# Patient Record
Sex: Female | Born: 1960
Health system: Southern US, Community
[De-identification: ages and names within clinical notes are randomized; demographics above are authoritative.]

## PROBLEM LIST (undated history)

## (undated) DIAGNOSIS — E7800 Pure hypercholesterolemia, unspecified: Secondary | ICD-10-CM

## (undated) DIAGNOSIS — I10 Essential (primary) hypertension: Secondary | ICD-10-CM

## (undated) DIAGNOSIS — F32A Depression, unspecified: Secondary | ICD-10-CM

## (undated) DIAGNOSIS — M545 Low back pain: Secondary | ICD-10-CM

## (undated) DIAGNOSIS — R519 Headache, unspecified: Secondary | ICD-10-CM

## (undated) DIAGNOSIS — R51 Headache: Secondary | ICD-10-CM

## (undated) DIAGNOSIS — F329 Major depressive disorder, single episode, unspecified: Secondary | ICD-10-CM

## (undated) DIAGNOSIS — D649 Anemia, unspecified: Secondary | ICD-10-CM

## (undated) DIAGNOSIS — M1990 Unspecified osteoarthritis, unspecified site: Secondary | ICD-10-CM

## (undated) DIAGNOSIS — E119 Type 2 diabetes mellitus without complications: Secondary | ICD-10-CM

## (undated) DIAGNOSIS — F4312 Post-traumatic stress disorder, chronic: Secondary | ICD-10-CM

## (undated) DIAGNOSIS — R0602 Shortness of breath: Secondary | ICD-10-CM

## (undated) DIAGNOSIS — R011 Cardiac murmur, unspecified: Secondary | ICD-10-CM

## (undated) DIAGNOSIS — E1365 Other specified diabetes mellitus with hyperglycemia: Secondary | ICD-10-CM

## (undated) DIAGNOSIS — R569 Unspecified convulsions: Secondary | ICD-10-CM

## (undated) HISTORY — DX: Type 2 diabetes mellitus without complications: E11.9

## (undated) HISTORY — DX: Low back pain: M54.5

## (undated) HISTORY — DX: Other specified diabetes mellitus with hyperglycemia: E13.65

## (undated) HISTORY — DX: Post-traumatic stress disorder, chronic: F43.12

## (undated) HISTORY — DX: Shortness of breath: R06.02

## (undated) HISTORY — PX: BREAST SURGERY: SHX581

## (undated) HISTORY — DX: Essential (primary) hypertension: I10

## (undated) HISTORY — PX: FINE NEEDLE ASPIRATION: SHX406

## (undated) HISTORY — PX: CHOLECYSTECTOMY: SHX55

---

## 2009-06-08 ENCOUNTER — Emergency Department (HOSPITAL_COMMUNITY): Admission: EM | Admit: 2009-06-08 | Discharge: 2009-06-08 | Payer: Self-pay | Admitting: Emergency Medicine

## 2014-07-19 ENCOUNTER — Emergency Department: Payer: Self-pay | Admitting: Emergency Medicine

## 2014-08-06 HISTORY — PX: BREAST EXCISIONAL BIOPSY: SUR124

## 2014-11-09 ENCOUNTER — Emergency Department
Admit: 2014-11-09 | Disposition: A | Discharge status: Home / Self Care | Payer: Self-pay | Admitting: Emergency Medicine

## 2014-11-09 LAB — COMPREHENSIVE METABOLIC PANEL
ALBUMIN: 4.1 g/dL
ALT: 16 U/L
AST: 12 U/L — AB
Alkaline Phosphatase: 67 U/L
Anion Gap: 7 (ref 7–16)
BUN: 14 mg/dL
Bilirubin,Total: 0.2 mg/dL — ABNORMAL LOW
CALCIUM: 9.2 mg/dL
CO2: 25 mmol/L
CREATININE: 0.65 mg/dL
Chloride: 106 mmol/L
EGFR (Non-African Amer.): >60
Glucose: 113 mg/dL — ABNORMAL HIGH
POTASSIUM: 3.8 mmol/L
Sodium: 138 mmol/L
Total Protein: 8.1 g/dL

## 2014-11-09 LAB — CBC WITH DIFFERENTIAL/PLATELET
BASOS PCT: 0.4 %
Basophil #: 0 10*3/uL (ref 0.0–0.1)
EOS PCT: 2.5 %
Eosinophil #: 0.1 10*3/uL (ref 0.0–0.7)
HCT: 38.3 % (ref 35.0–47.0)
HGB: 12.3 g/dL (ref 12.0–16.0)
LYMPHS ABS: 1.4 10*3/uL (ref 1.0–3.6)
Lymphocyte %: 24.3 %
MCH: 27.9 pg (ref 26.0–34.0)
MCHC: 32.1 g/dL (ref 32.0–36.0)
MCV: 87 fL (ref 80–100)
MONOS PCT: 6.7 %
Monocyte #: 0.4 x10 3/mm (ref 0.2–0.9)
NEUTROS ABS: 3.9 10*3/uL (ref 1.4–6.5)
Neutrophil %: 66.1 %
Platelet: 261 10*3/uL (ref 150–440)
RBC: 4.4 10*6/uL (ref 3.80–5.20)
RDW: 17.9 % — ABNORMAL HIGH (ref 11.5–14.5)
WBC: 5.9 10*3/uL (ref 3.6–11.0)

## 2014-11-21 ENCOUNTER — Emergency Department
Admit: 2014-11-21 | Disposition: A | Discharge status: Home / Self Care | Payer: Self-pay | Admitting: Emergency Medicine

## 2014-11-25 DIAGNOSIS — Z029 Encounter for administrative examinations, unspecified: Secondary | ICD-10-CM

## 2014-12-23 ENCOUNTER — Other Ambulatory Visit: Payer: Self-pay | Admitting: Ophthalmology

## 2014-12-23 DIAGNOSIS — J4520 Mild intermittent asthma, uncomplicated: Secondary | ICD-10-CM

## 2014-12-23 DIAGNOSIS — R0602 Shortness of breath: Secondary | ICD-10-CM

## 2015-01-06 ENCOUNTER — Ambulatory Visit: Payer: Self-pay | Attending: Ophthalmology

## 2015-03-02 ENCOUNTER — Encounter: Payer: Self-pay | Admitting: Family Medicine

## 2015-03-02 ENCOUNTER — Ambulatory Visit (INDEPENDENT_AMBULATORY_CARE_PROVIDER_SITE_OTHER): Payer: Medicaid Other | Admitting: Family Medicine

## 2015-03-02 VITALS — BP 166/100 | HR 69 | Temp 97.0°F | Ht 64.0 in | Wt 172.0 lb

## 2015-03-02 DIAGNOSIS — R490 Dysphonia: Secondary | ICD-10-CM | POA: Diagnosis not present

## 2015-03-02 DIAGNOSIS — R0602 Shortness of breath: Secondary | ICD-10-CM | POA: Diagnosis not present

## 2015-03-02 DIAGNOSIS — F331 Major depressive disorder, recurrent, moderate: Secondary | ICD-10-CM

## 2015-03-02 DIAGNOSIS — I10 Essential (primary) hypertension: Secondary | ICD-10-CM

## 2015-03-02 DIAGNOSIS — Z1239 Encounter for other screening for malignant neoplasm of breast: Secondary | ICD-10-CM

## 2015-03-02 DIAGNOSIS — N6452 Nipple discharge: Secondary | ICD-10-CM

## 2015-03-02 DIAGNOSIS — Z23 Encounter for immunization: Secondary | ICD-10-CM | POA: Diagnosis not present

## 2015-03-02 DIAGNOSIS — F339 Major depressive disorder, recurrent, unspecified: Secondary | ICD-10-CM | POA: Insufficient documentation

## 2015-03-02 DIAGNOSIS — N63 Unspecified lump in breast: Secondary | ICD-10-CM

## 2015-03-02 DIAGNOSIS — L309 Dermatitis, unspecified: Secondary | ICD-10-CM | POA: Diagnosis not present

## 2015-03-02 DIAGNOSIS — N6320 Unspecified lump in the left breast, unspecified quadrant: Secondary | ICD-10-CM

## 2015-03-02 DIAGNOSIS — Z720 Tobacco use: Secondary | ICD-10-CM | POA: Diagnosis not present

## 2015-03-02 MED ORDER — TRIAMCINOLONE ACETONIDE 0.5 % EX CREA: 1.0000 "application " | TOPICAL_CREAM | Freq: Two times a day (BID) | CUTANEOUS | Status: DC | PRN

## 2015-03-02 MED ORDER — NICOTINE 7 MG/24HR TD PT24: 7.0000 mg | MEDICATED_PATCH | Freq: Every day | TRANSDERMAL | Status: DC

## 2015-03-02 MED ORDER — AMLODIPINE BESYLATE 5 MG PO TABS: 5.0000 mg | ORAL_TABLET | Freq: Every day | ORAL | Status: DC

## 2015-03-02 MED ORDER — HYDROCHLOROTHIAZIDE 25 MG PO TABS: 25.0000 mg | ORAL_TABLET | Freq: Every day | ORAL | Status: DC

## 2015-03-02 MED ORDER — SERTRALINE HCL 50 MG PO TABS: ORAL_TABLET | ORAL | Status: DC

## 2015-03-02 NOTE — Assessment & Plan Note (Signed)
DASH guidelines; start two meds, HCTZ 25 mg daily and amlodipine 5 mg daily; return in 2 weeks; reviewed kidney function from April and it was fine
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
## 2015-03-02 NOTE — Assessment & Plan Note (Signed)
Recommended counseling; start SSRI; crisis number given

## 2015-03-02 NOTE — Assessment & Plan Note (Signed)
Order mammogram of both breasts, diagnostic of left

## 2015-03-02 NOTE — Patient Instructions (Addendum)
If you have not heard anything from my staff in a week about any orders/referrals/studies from today, please contact us here to follow-up [phone] I have ordered breast imaging and a referral to the surgeon about the place on the left breast We'll have you see the dermatologist We'll have you start the new medicines for your blood pressure Return in two weeks for follow-up Start new medicine for your mood Do call to set up an appointment with a counselor and you can start the healing process Call the crisis line if needed Start the new cream on the arms Do try to quit smoking Use the nicotine patches as a bridge to STOPPING completely You can do this!  Smoking Cessation, Tips for Success If you are ready to quit smoking, congratulations! You have chosen to help yourself be healthier. Cigarettes bring nicotine, tar, carbon monoxide, and other irritants into your body. Your lungs, heart, and blood vessels will be able to work better without these poisons. There are many different ways to quit smoking. Nicotine gum, nicotine patches, a nicotine inhaler, or nicotine nasal spray can help with physical craving. Hypnosis, support groups, and medicines help break the habit of smoking. WHAT THINGS CAN I DO TO MAKE QUITTING EASIER?  Here are some tips to help you quit for good:  Pick a date when you will quit smoking completely. Tell all of your friends and family about your plan to quit on that date.  Do not try to slowly cut down on the number of cigarettes you are smoking. Pick a quit date and quit smoking completely starting on that day.  Throw away all cigarettes.   Clean and remove all ashtrays from your home, work, and car.  On a card, write down your reasons for quitting. Carry the card with you and read it when you get the urge to smoke.  Cleanse your body of nicotine. Drink enough water and fluids to keep your urine clear or pale yellow. Do this after quitting to flush the nicotine  from your body.  Learn to predict your moods. Do not let a bad situation be your excuse to have a cigarette. Some situations in your life might tempt you into wanting a cigarette.  Never have "just one" cigarette. It leads to wanting another and another. Remind yourself of your decision to quit.  Change habits associated with smoking. If you smoked while driving or when feeling stressed, try other activities to replace smoking. Stand up when drinking your coffee. Brush your teeth after eating. Sit in a different chair when you read the paper. Avoid alcohol while trying to quit, and try to drink fewer caffeinated beverages. Alcohol and caffeine may urge you to smoke.  Avoid foods and drinks that can trigger a desire to smoke, such as sugary or spicy foods and alcohol.  Ask people who smoke not to smoke around you.  Have something planned to do right after eating or having a cup of coffee. For example, plan to take a walk or exercise.  Try a relaxation exercise to calm you down and decrease your stress. Remember, you may be tense and nervous for the first 2 weeks after you quit, but this will pass.  Find new activities to keep your hands busy. Play with a pen, coin, or rubber band. Doodle or draw things on paper.  Brush your teeth right after eating. This will help cut down on the craving for the taste of tobacco after meals. You can also  try mouthwash.   Use oral substitutes in place of cigarettes. Try using lemon drops, carrots, cinnamon sticks, or chewing gum. Keep them handy so they are available when you have the urge to smoke.  When you have the urge to smoke, try deep breathing.  Designate your home as a nonsmoking area.  If you are a heavy smoker, ask your health care provider about a prescription for nicotine chewing gum. It can ease your withdrawal from nicotine.  Reward yourself. Set aside the cigarette money you save and buy yourself something nice.  Look for support from  others. Join a support group or smoking cessation program. Ask someone at home or at work to help you with your plan to quit smoking.  Always ask yourself, "Do I need this cigarette or is this just a reflex?" Tell yourself, "Today, I choose not to smoke," or "I do not want to smoke." You are reminding yourself of your decision to quit.  Do not replace cigarette smoking with electronic cigarettes (commonly called e-cigarettes). The safety of e-cigarettes is unknown, and some may contain harmful chemicals.  If you relapse, do not give up! Plan ahead and think about what you will do the next time you get the urge to smoke. HOW WILL I FEEL WHEN I QUIT SMOKING? You may have symptoms of withdrawal because your body is used to nicotine (the addictive substance in cigarettes). You may crave cigarettes, be irritable, feel very hungry, cough often, get headaches, or have difficulty concentrating. The withdrawal symptoms are only temporary. They are strongest when you first quit but will go away within 10-14 days. When withdrawal symptoms occur, stay in control. Think about your reasons for quitting. Remind yourself that these are signs that your body is healing and getting used to being without cigarettes. Remember that withdrawal symptoms are easier to treat than the major diseases that smoking can cause.  Even after the withdrawal is over, expect periodic urges to smoke. However, these cravings are generally short lived and will go away whether you smoke or not. Do not smoke! WHAT RESOURCES ARE AVAILABLE TO HELP ME QUIT SMOKING? Your health care provider can direct you to community resources or hospitals for support, which may include:  Group support.  Education.  Hypnosis.  Therapy. Document Released: 04/20/2004 Document Revised: 12/07/2013 Document Reviewed: 01/08/2013 [facility] Patient Information 2015 [place], [place]. This information is not intended to replace advice given to you by your health care  provider. Make sure you discuss any questions you have with your health care provider. DASH Eating Plan DASH stands for "Dietary Approaches to Stop Hypertension." The DASH eating plan is a healthy eating plan that has been shown to reduce high blood pressure (hypertension). Additional health benefits may include reducing the risk of type 2 diabetes mellitus, heart disease, and stroke. The DASH eating plan may also help with weight loss. WHAT DO I NEED TO KNOW ABOUT THE DASH EATING PLAN? For the DASH eating plan, you will follow these general guidelines:  Choose foods with a percent daily value for sodium of less than 5% (as listed on the food label).  Use salt-free seasonings or herbs instead of table salt or sea salt.  Check with your health care provider or pharmacist before using salt substitutes.  Eat lower-sodium products, often labeled as "lower sodium" or "no salt added."  Eat fresh foods.  Eat more vegetables, fruits, and low-fat dairy products.  Choose whole grains. Look for the word "whole" as the first word  in the ingredient list.  Choose fish and skinless chicken or [place] more often than red meat. Limit fish, poultry, and meat to 6 oz (170 g) each day.  Limit sweets, desserts, sugars, and sugary drinks.  Choose heart-healthy fats.  Limit cheese to 1 oz (28 g) per day.  Eat more home-cooked food and less restaurant, buffet, and fast food.  Limit fried foods.  Cook foods using methods other than frying.  Limit canned vegetables. If you do use them, rinse them well to decrease the sodium.  When eating at a restaurant, ask that your food be prepared with less salt, or no salt if possible. WHAT FOODS CAN I EAT? Seek help from a dietitian for individual calorie needs. Grains Whole grain or whole wheat bread. Brown rice. Whole grain or whole wheat pasta. Quinoa, bulgur, and whole grain cereals. Low-sodium cereals. Corn or whole wheat flour tortillas. Whole grain  cornbread. Whole grain crackers. Low-sodium crackers. Vegetables Fresh or frozen vegetables (raw, steamed, roasted, or grilled). Low-sodium or reduced-sodium tomato and vegetable juices. Low-sodium or reduced-sodium tomato sauce and paste. Low-sodium or reduced-sodium canned vegetables.  Fruits All fresh, canned (in natural juice), or frozen fruits. Meat and Other Protein Products Ground beef (85% or leaner), grass-fed beef, or beef trimmed of fat. Skinless chicken or [place]. Ground chicken or [place]. Pork trimmed of fat. All fish and seafood. Eggs. Dried beans, peas, or lentils. Unsalted nuts and seeds. Unsalted canned beans. Dairy Low-fat dairy products, such as skim or 1% milk, 2% or reduced-fat cheeses, low-fat ricotta or cottage cheese, or plain low-fat yogurt. Low-sodium or reduced-sodium cheeses. Fats and Oils Tub margarines without trans fats. Light or reduced-fat mayonnaise and salad dressings (reduced sodium). Avocado. Safflower, olive, or canola oils. Natural peanut or almond butter. Other Unsalted popcorn and pretzels. The items listed above may not be a complete list of recommended foods or beverages. Contact your dietitian for more options. WHAT FOODS ARE NOT RECOMMENDED? Grains White bread. White pasta. White rice. Refined cornbread. Bagels and croissants. Crackers that contain trans fat. Vegetables Creamed or fried vegetables. Vegetables in a cheese sauce. Regular canned vegetables. Regular canned tomato sauce and paste. Regular tomato and vegetable juices. Fruits Dried fruits. Canned fruit in light or heavy syrup. Fruit juice. Meat and Other Protein Products Fatty cuts of meat. Ribs, chicken wings, bacon, sausage, bologna, salami, chitterlings, fatback, hot dogs, bratwurst, and packaged luncheon meats. Salted nuts and seeds. Canned beans with salt. Dairy Whole or 2% milk, cream, half-and-half, and cream cheese. Whole-fat or sweetened yogurt. Full-fat cheeses or blue cheese.  Nondairy creamers and whipped toppings. Processed cheese, cheese spreads, or cheese curds. Condiments Onion and garlic salt, seasoned salt, table salt, and sea salt. Canned and packaged gravies. Worcestershire sauce. Tartar sauce. Barbecue sauce. Teriyaki sauce. Soy sauce, including reduced sodium. Steak sauce. Fish sauce. Oyster sauce. Cocktail sauce. Horseradish. Ketchup and mustard. Meat flavorings and tenderizers. Bouillon cubes. Hot sauce. Tabasco sauce. Marinades. Taco seasonings. Relishes. Fats and Oils Butter, stick margarine, lard, shortening, ghee, and bacon fat. Coconut, palm kernel, or palm oils. Regular salad dressings. Other Pickles and olives. Salted popcorn and pretzels. The items listed above may not be a complete list of foods and beverages to avoid. Contact your dietitian for more information. WHERE CAN I FIND MORE INFORMATION? National Heart, Lung, and Blood Institute: travelstabloid.com Document Released: 07/12/2011 Document Revised: 12/07/2013 Document Reviewed: 05/27/2013 [facility] Patient Information 2015 [place], [place]. This information is not intended to replace advice given to you by your  health care provider. Make sure you discuss any questions you have with your health care provider.

## 2015-03-04 DIAGNOSIS — R0602 Shortness of breath: Secondary | ICD-10-CM | POA: Diagnosis not present

## 2015-03-04 DIAGNOSIS — Z23 Encounter for immunization: Secondary | ICD-10-CM

## 2015-03-04 DIAGNOSIS — Z720 Tobacco use: Secondary | ICD-10-CM | POA: Diagnosis not present

## 2015-03-04 NOTE — Assessment & Plan Note (Signed)
Readdress at next visit; smoker; will need ENT referral; I will go ahead and enter that referral in case she doesn't keep her f/u with me

## 2015-03-08 ENCOUNTER — Ambulatory Visit: Payer: Disability Insurance | Attending: Ophthalmology

## 2015-03-08 ENCOUNTER — Ambulatory Visit (INDEPENDENT_AMBULATORY_CARE_PROVIDER_SITE_OTHER): Payer: Medicaid Other | Admitting: General Surgery

## 2015-03-08 ENCOUNTER — Encounter: Payer: Self-pay | Admitting: General Surgery

## 2015-03-08 ENCOUNTER — Ambulatory Visit: Payer: Medicaid Other

## 2015-03-08 VITALS — BP 128/82 | HR 72 | Resp 16 | Ht 63.0 in | Wt 168.0 lb

## 2015-03-08 DIAGNOSIS — R0602 Shortness of breath: Secondary | ICD-10-CM | POA: Diagnosis not present

## 2015-03-08 DIAGNOSIS — J4520 Mild intermittent asthma, uncomplicated: Secondary | ICD-10-CM

## 2015-03-08 DIAGNOSIS — J45909 Unspecified asthma, uncomplicated: Secondary | ICD-10-CM | POA: Insufficient documentation

## 2015-03-08 DIAGNOSIS — N6452 Nipple discharge: Secondary | ICD-10-CM

## 2015-03-08 MED ORDER — ALBUTEROL SULFATE (2.5 MG/3ML) 0.083% IN NEBU: 2.5000 mg | INHALATION_SOLUTION | Freq: Once | RESPIRATORY_TRACT | Status: AC

## 2015-03-08 NOTE — Patient Instructions (Signed)
Mammogram scheduled. Follow up afetr

## 2015-03-08 NOTE — Progress Notes (Signed)
Patient ID: Allison Cooke, female   DOB: 01-31-1961, 54 y.o.   MRN: [id number]  Chief Complaint  Patient presents with  . Breast Problem    HPI Allison Cooke is a 54 y.o. female.  She has been having bleeding from the left nipple; that has been going on for about two or three months; she went to the ER for that, [facility]; it stopped and then started back bleeding last week. She feels like there is something on the side of the left breast. She can a lump in the left breast. She denies breast injury or trauma.  She had fluid drawn off (cyst) years ago, benign while in [place].  She has never had a mammogram. She does have a paternal first cousin with breast cancer. No prior colonoscopy.  HPI  Past Medical History  Diagnosis Date  . Hypertension   . Pre-diabetes   . SOB (shortness of breath)     Past Surgical History  Procedure Laterality Date  . Cholecystectomy    . Cesarean section    . Fine needle aspiration Left     breast cyst, benign, age 28?    Family History  Problem Relation Age of Onset  . Hypertension Mother   . Heart disease Mother   . Hyperlipidemia Mother   . Diabetes Mother   . Heart disease Father   . Hyperlipidemia Father   . Hypertension Father   . Breast cancer Cousin     Social History History  Substance Use Topics  . Smoking status: Current Some Day Smoker -- 20 years    Types: Cigarettes  . Smokeless tobacco: Never Used  . Alcohol Use: No    No Known Allergies  Current Outpatient Prescriptions  Medication Sig Dispense Refill  . amLODipine (NORVASC) 5 MG tablet Take 1 tablet (5 mg total) by mouth daily. 30 tablet 2  . hydrochlorothiazide (HYDRODIURIL) 25 MG tablet Take 1 tablet (25 mg total) by mouth daily. 30 tablet 2  . nicotine (EQ NICOTINE) 7 mg/24hr patch Place 1 patch (7 mg total) onto the skin daily. 28 patch 0  . sertraline (ZOLOFT) 50 MG tablet Start with one-half of a pill daily for four days, then one whole pill daily  28 tablet 0  . triamcinolone cream (KENALOG) 0.5 % Apply 1 application topically 2 (two) times daily as needed. Too strong for face, under arms, or in groin 60 g 1   No current facility-administered medications for this visit.   Facility-Administered Medications Ordered in Other Visits  Medication Dose Route Frequency Provider Last Rate Last Dose  . albuterol (PROVENTIL) (2.5 MG/3ML) 0.083% nebulizer solution 2.5 mg  2.5 mg Nebulization Once Arnetha Courser, MD   2.5 mg at 03/08/15 4680    Review of Systems Review of Systems  Constitutional: Negative.   Respiratory: Positive for cough and shortness of breath.   Cardiovascular: Negative.     Blood pressure 128/82, pulse 72, resp. rate 16, height 5\' 3"  (1.6 m), weight 168 lb (76.204 kg), last menstrual period 02/13/2015.  Physical Exam Physical Exam  Constitutional: She is oriented to person, place, and time. She appears well-developed and well-nourished.  HENT:  Mouth/Throat: Oropharynx is clear and moist.  Eyes: Conjunctivae are normal. No scleral icterus.  Neck: Neck supple.  Cardiovascular: Normal rate, regular rhythm and normal heart sounds.   Pulmonary/Chest: Effort normal and breath sounds normal. Right breast exhibits no inverted nipple, no mass, no nipple discharge, no skin change and no tenderness. Left  breast exhibits nipple discharge ( reddish brown discharge from left nipple.   ). Left breast exhibits no inverted nipple, no mass, no skin change and no tenderness.    Abdominal: Soft. Normal appearance and bowel sounds are normal. There is no tenderness.  Lymphadenopathy:    She has no cervical adenopathy.  Neurological: She is alert and oriented to person, place, and time.  Skin: Skin is warm and dry.  Psychiatric: Her behavior is normal.    Data Reviewed Office notes. [place] of left breast reveals markedly dilated ducts and cystic spaces in the 3-4 oclock location and extends from the areola to 4-6 cm away.    Assessment    Bloody nipple discharge from left breast    Plan    Discharge sent for cytology. Schedule bilateral diagnostic mammogram. Follow up after mammogram. Discussed fully with patient.      PCP:  Karle Plumber G 03/08/2015, 8:27 PM

## 2015-03-09 ENCOUNTER — Telehealth: Payer: Self-pay

## 2015-03-09 ENCOUNTER — Other Ambulatory Visit: Payer: Self-pay

## 2015-03-09 NOTE — Telephone Encounter (Signed)
After looking in EPIC while trying to schedule her diagnostic mammo and u/s, I saw a note stating she was scheduled for it to be done at [facility]. I called [facility] and verified that she is scheduled to have her Diagnostic Mammo and U/S done at their office on Tues. Aug. 9th.

## 2015-03-15 ENCOUNTER — Encounter: Payer: Self-pay | Admitting: General Surgery

## 2015-03-16 ENCOUNTER — Telehealth: Payer: Self-pay | Admitting: *Deleted

## 2015-03-16 NOTE — Telephone Encounter (Signed)
-----   Message from Christene Lye, MD sent at 03/16/2015  2:36 PM EDT ----- Mammogram and [place] reviewed. Pt needs a core biopsy. Would like to see her next week on Monday for core biopsy left breast

## 2015-03-16 NOTE — Telephone Encounter (Signed)
Message left for patient to call the office.   Her appointment with Dr. Jamal Collin has been changed to Monday, 03-21-15 at 2:45 pm.

## 2015-03-17 NOTE — Telephone Encounter (Signed)
Patient was contacted today and aware of appointment scheduled for Monday, 03-21-15.

## 2015-03-21 ENCOUNTER — Ambulatory Visit (INDEPENDENT_AMBULATORY_CARE_PROVIDER_SITE_OTHER): Payer: Medicaid Other | Admitting: General Surgery

## 2015-03-21 ENCOUNTER — Other Ambulatory Visit: Payer: Medicaid Other

## 2015-03-21 ENCOUNTER — Encounter: Payer: Self-pay | Admitting: General Surgery

## 2015-03-21 ENCOUNTER — Ambulatory Visit: Payer: Medicaid Other | Admitting: Family Medicine

## 2015-03-21 VITALS — BP 136/78 | HR 76 | Resp 13 | Ht 63.0 in | Wt 172.0 lb

## 2015-03-21 DIAGNOSIS — N6320 Unspecified lump in the left breast, unspecified quadrant: Secondary | ICD-10-CM

## 2015-03-21 DIAGNOSIS — N63 Unspecified lump in breast: Secondary | ICD-10-CM

## 2015-03-21 NOTE — Progress Notes (Signed)
Patient ID: Allison Cooke, female   DOB: 1961-07-17, 54 y.o.   MRN: [id number] patient is here for a scheduled left breast core biopsy. Mammogram was not revealing of any significant suspicious areas. [place] shwed the same large conglomrerate of dilated ducts. Cytology was suspicious with a lot of atypical cells. Core biopsy was completed today with pt's consent. There was no definite solid areas to biopsy.  Largest of the ductal area was biopsied and large amount of dark bloody fluid drained from the tract. Several cores of tissue were obtained.  Further plan based on pathology       PCP: Arnetha Courser

## 2015-03-21 NOTE — Patient Instructions (Addendum)
You may shower. You can remove the water proof dressing tomorrow. Steri strips underneath will come of in about a week. We will call you with your biopsy results.

## 2015-03-22 ENCOUNTER — Telehealth: Payer: Self-pay | Admitting: *Deleted

## 2015-03-22 NOTE — Telephone Encounter (Signed)
-----   Message from Christene Lye, MD sent at 03/22/2015  1:05 PM EDT ----- Please let pt pt know the pathology was normal. Need to see her back in 3-4 weeks.

## 2015-03-23 ENCOUNTER — Ambulatory Visit: Payer: Medicaid Other | Admitting: General Surgery

## 2015-03-23 NOTE — Telephone Encounter (Signed)
Notified patient as instructed, patient pleased. Discussed follow-up appointments, patient agrees  

## 2015-03-30 ENCOUNTER — Ambulatory Visit (INDEPENDENT_AMBULATORY_CARE_PROVIDER_SITE_OTHER): Payer: Medicaid Other | Admitting: Family Medicine

## 2015-03-30 ENCOUNTER — Encounter: Payer: Self-pay | Admitting: Family Medicine

## 2015-03-30 VITALS — BP 126/87 | HR 77 | Temp 97.5°F | Ht 64.5 in | Wt 168.0 lb

## 2015-03-30 DIAGNOSIS — L309 Dermatitis, unspecified: Secondary | ICD-10-CM | POA: Diagnosis not present

## 2015-03-30 DIAGNOSIS — Z5181 Encounter for therapeutic drug level monitoring: Secondary | ICD-10-CM | POA: Diagnosis not present

## 2015-03-30 DIAGNOSIS — R490 Dysphonia: Secondary | ICD-10-CM | POA: Diagnosis not present

## 2015-03-30 DIAGNOSIS — I10 Essential (primary) hypertension: Secondary | ICD-10-CM

## 2015-03-30 DIAGNOSIS — F330 Major depressive disorder, recurrent, mild: Secondary | ICD-10-CM | POA: Diagnosis not present

## 2015-03-30 DIAGNOSIS — Z720 Tobacco use: Secondary | ICD-10-CM | POA: Diagnosis not present

## 2015-03-30 DIAGNOSIS — R1310 Dysphagia, unspecified: Secondary | ICD-10-CM | POA: Diagnosis not present

## 2015-03-30 DIAGNOSIS — N63 Unspecified lump in breast: Secondary | ICD-10-CM

## 2015-03-30 DIAGNOSIS — N6320 Unspecified lump in the left breast, unspecified quadrant: Secondary | ICD-10-CM

## 2015-03-30 DIAGNOSIS — J0101 Acute recurrent maxillary sinusitis: Secondary | ICD-10-CM | POA: Diagnosis not present

## 2015-03-30 MED ORDER — AMLODIPINE BESYLATE 5 MG PO TABS: 5.0000 mg | ORAL_TABLET | Freq: Every day | ORAL | Status: DC

## 2015-03-30 MED ORDER — HYDROCHLOROTHIAZIDE 25 MG PO TABS: 25.0000 mg | ORAL_TABLET | Freq: Every day | ORAL | Status: DC

## 2015-03-30 MED ORDER — SERTRALINE HCL 100 MG PO TABS: 100.0000 mg | ORAL_TABLET | Freq: Every day | ORAL | Status: DC

## 2015-03-30 MED ORDER — AMOXICILLIN-POT CLAVULANATE 875-125 MG PO TABS: 1.0000 | ORAL_TABLET | Freq: Two times a day (BID) | ORAL | Status: DC

## 2015-03-30 NOTE — Assessment & Plan Note (Signed)
Phone number given today for derm

## 2015-03-30 NOTE — Assessment & Plan Note (Signed)
Check BMP on the new meds

## 2015-03-30 NOTE — Assessment & Plan Note (Signed)
Much improved with medication; praise given; continue meds; check BMP today; DASH guidelines encouraged; she is smoking much less

## 2015-03-30 NOTE — Assessment & Plan Note (Signed)
Improved, but patient believes it could be a little better; increase sertraline to 100 mg daily; new Rx sent; reasons to call before next appt reviewed (inflated mood, hypomania described); she agrees to call before next appt if any problems

## 2015-03-30 NOTE — Assessment & Plan Note (Signed)
refer to ENT to evaluate for mass or other problem since she is a smoker

## 2015-03-30 NOTE — Patient Instructions (Addendum)
GINA --> please give patient the phone number to the dermatologist so she can scheduled another appointment Please do eat yogurt daily or take a probiotic daily for the next month or two We want to replace the healthy germs in the gut If you notice foul, watery diarrhea in the next two months, schedule an appointment RIGHT AWAY Start the antibiotics for the presumed sinus infection I have entered a referral for you to see an ear-nose-throat specialist about your voice and choking sensation Avoid steak and large pieces of bread and meat, chew food thoroughly to help avoid choking Continue the blood pressure medicine    Sinusitis Sinusitis is redness, soreness, and inflammation of the paranasal sinuses. Paranasal sinuses are air pockets within the bones of your face (beneath the eyes, the middle of the forehead, or above the eyes). In healthy paranasal sinuses, mucus is able to drain out, and air is able to circulate through them by way of your nose. However, when your paranasal sinuses are inflamed, mucus and air can become trapped. This can allow bacteria and other germs to grow and cause infection. Sinusitis can develop quickly and last only a short time (acute) or continue over a long period (chronic). Sinusitis that lasts for more than 12 weeks is considered chronic.  CAUSES  Causes of sinusitis include:  Allergies.  Structural abnormalities, such as displacement of the cartilage that separates your nostrils (deviated septum), which can decrease the air flow through your nose and sinuses and affect sinus drainage.  Functional abnormalities, such as when the small hairs (cilia) that line your sinuses and help remove mucus do not work properly or are not present. SIGNS AND SYMPTOMS  Symptoms of acute and chronic sinusitis are the same. The primary symptoms are pain and pressure around the affected sinuses. Other symptoms include:  Upper toothache.  Earache.  Headache.  Bad  breath.  Decreased sense of smell and taste.  A cough, which worsens when you are lying flat.  Fatigue.  Fever.  Thick drainage from your nose, which often is green and may contain pus (purulent).  Swelling and warmth over the affected sinuses. DIAGNOSIS  Your health care provider will perform a physical exam. During the exam, your health care provider may:  Look in your nose for signs of abnormal growths in your nostrils (nasal polyps).  Tap over the affected sinus to check for signs of infection.  View the inside of your sinuses (endoscopy) using an imaging device that has a light attached (endoscope). If your health care provider suspects that you have chronic sinusitis, one or more of the following tests may be recommended:  Allergy tests.  Nasal culture. A sample of mucus is taken from your nose, sent to a lab, and screened for bacteria.  Nasal cytology. A sample of mucus is taken from your nose and examined by your health care provider to determine if your sinusitis is related to an allergy. TREATMENT  Most cases of acute sinusitis are related to a viral infection and will resolve on their own within 10 days. Sometimes medicines are prescribed to help relieve symptoms (pain medicine, decongestants, nasal steroid sprays, or saline sprays).  However, for sinusitis related to a bacterial infection, your health care provider will prescribe antibiotic medicines. These are medicines that will help kill the bacteria causing the infection.  Rarely, sinusitis is caused by a fungal infection. In theses cases, your health care provider will prescribe antifungal medicine. For some cases of chronic sinusitis, surgery is  needed. Generally, these are cases in which sinusitis recurs more than 3 times per year, despite other treatments. HOME CARE INSTRUCTIONS   Drink plenty of water. Water helps thin the mucus so your sinuses can drain more easily.  Use a humidifier.  Inhale steam 3 to 4  times a day (for example, sit in the bathroom with the shower running).  Apply a warm, moist washcloth to your face 3 to 4 times a day, or as directed by your health care provider.  Use saline nasal sprays to help moisten and clean your sinuses.  Take medicines only as directed by your health care provider.  If you were prescribed either an antibiotic or antifungal medicine, finish it all even if you start to feel better. SEEK IMMEDIATE MEDICAL CARE IF:  You have increasing pain or severe headaches.  You have nausea, vomiting, or drowsiness.  You have swelling around your face.  You have vision problems.  You have a stiff neck.  You have difficulty breathing. MAKE SURE YOU:   Understand these instructions.  Will watch your condition.  Will get help right away if you are not doing well or get worse. Document Released: 07/23/2005 Document Revised: 12/07/2013 Document Reviewed: 08/07/2011 [facility] Patient Information 2015 [place], [place]. This information is not intended to replace advice given to you by your health care provider. Make sure you discuss any questions you have with your health care provider. DASH Eating Plan DASH stands for "Dietary Approaches to Stop Hypertension." The DASH eating plan is a healthy eating plan that has been shown to reduce high blood pressure (hypertension). Additional health benefits may include reducing the risk of type 2 diabetes mellitus, heart disease, and stroke. The DASH eating plan may also help with weight loss. WHAT DO I NEED TO KNOW ABOUT THE DASH EATING PLAN? For the DASH eating plan, you will follow these general guidelines:  Choose foods with a percent daily value for sodium of less than 5% (as listed on the food label).  Use salt-free seasonings or herbs instead of table salt or sea salt.  Check with your health care provider or pharmacist before using salt substitutes.  Eat lower-sodium products, often labeled as "lower sodium"  or "no salt added."  Eat fresh foods.  Eat more vegetables, fruits, and low-fat dairy products.  Choose whole grains. Look for the word "whole" as the first word in the ingredient list.  Choose fish and skinless chicken or [place] more often than red meat. Limit fish, poultry, and meat to 6 oz (170 g) each day.  Limit sweets, desserts, sugars, and sugary drinks.  Choose heart-healthy fats.  Limit cheese to 1 oz (28 g) per day.  Eat more home-cooked food and less restaurant, buffet, and fast food.  Limit fried foods.  Cook foods using methods other than frying.  Limit canned vegetables. If you do use them, rinse them well to decrease the sodium.  When eating at a restaurant, ask that your food be prepared with less salt, or no salt if possible. WHAT FOODS CAN I EAT? Seek help from a dietitian for individual calorie needs. Grains Whole grain or whole wheat bread. Brown rice. Whole grain or whole wheat pasta. Quinoa, bulgur, and whole grain cereals. Low-sodium cereals. Corn or whole wheat flour tortillas. Whole grain cornbread. Whole grain crackers. Low-sodium crackers. Vegetables Fresh or frozen vegetables (raw, steamed, roasted, or grilled). Low-sodium or reduced-sodium tomato and vegetable juices. Low-sodium or reduced-sodium tomato sauce and paste. Low-sodium or reduced-sodium canned  vegetables.  Fruits All fresh, canned (in natural juice), or frozen fruits. Meat and Other Protein Products Ground beef (85% or leaner), grass-fed beef, or beef trimmed of fat. Skinless chicken or [place]. Ground chicken or [place]. Pork trimmed of fat. All fish and seafood. Eggs. Dried beans, peas, or lentils. Unsalted nuts and seeds. Unsalted canned beans. Dairy Low-fat dairy products, such as skim or 1% milk, 2% or reduced-fat cheeses, low-fat ricotta or cottage cheese, or plain low-fat yogurt. Low-sodium or reduced-sodium cheeses. Fats and Oils Tub margarines without trans fats. Light or  reduced-fat mayonnaise and salad dressings (reduced sodium). Avocado. Safflower, olive, or canola oils. Natural peanut or almond butter. Other Unsalted popcorn and pretzels. The items listed above may not be a complete list of recommended foods or beverages. Contact your dietitian for more options. WHAT FOODS ARE NOT RECOMMENDED? Grains White bread. White pasta. White rice. Refined cornbread. Bagels and croissants. Crackers that contain trans fat. Vegetables Creamed or fried vegetables. Vegetables in a cheese sauce. Regular canned vegetables. Regular canned tomato sauce and paste. Regular tomato and vegetable juices. Fruits Dried fruits. Canned fruit in light or heavy syrup. Fruit juice. Meat and Other Protein Products Fatty cuts of meat. Ribs, chicken wings, bacon, sausage, bologna, salami, chitterlings, fatback, hot dogs, bratwurst, and packaged luncheon meats. Salted nuts and seeds. Canned beans with salt. Dairy Whole or 2% milk, cream, half-and-half, and cream cheese. Whole-fat or sweetened yogurt. Full-fat cheeses or blue cheese. Nondairy creamers and whipped toppings. Processed cheese, cheese spreads, or cheese curds. Condiments Onion and garlic salt, seasoned salt, table salt, and sea salt. Canned and packaged gravies. Worcestershire sauce. Tartar sauce. Barbecue sauce. Teriyaki sauce. Soy sauce, including reduced sodium. Steak sauce. Fish sauce. Oyster sauce. Cocktail sauce. Horseradish. Ketchup and mustard. Meat flavorings and tenderizers. Bouillon cubes. Hot sauce. Tabasco sauce. Marinades. Taco seasonings. Relishes. Fats and Oils Butter, stick margarine, lard, shortening, ghee, and bacon fat. Coconut, palm kernel, or palm oils. Regular salad dressings. Other Pickles and olives. Salted popcorn and pretzels. The items listed above may not be a complete list of foods and beverages to avoid. Contact your dietitian for more information. WHERE CAN I FIND MORE INFORMATION? National Heart,  Lung, and Blood Institute: travelstabloid.com Document Released: 07/12/2011 Document Revised: 12/07/2013 Document Reviewed: 05/27/2013 [facility] Patient Information 2015 [place], [place]. This information is not intended to replace advice given to you by your health care provider. Make sure you discuss any questions you have with your health care provider.

## 2015-03-30 NOTE — Assessment & Plan Note (Signed)
Managed by surgeon, appreciate his care

## 2015-03-30 NOTE — Progress Notes (Signed)
BP 126/87 mmHg  Pulse 77  Temp(Src) 97.5 F (36.4 C)  Ht 5' 4.5" (1.638 m)  Wt 168 lb (76.204 kg)  BMI 28.40 kg/m2  SpO2 98%  LMP 03/26/2015 (Approximate)   Subjective:    Patient ID: Allison Cooke, female    DOB: 04-06-1961, 54 y.o.   MRN: [id number]  HPI: Allison Cooke is a 54 y.o. female  Chief Complaint  Patient presents with  . Hypertension   She is here primarily for follow-up of her hypertension; at last visit, pressure was quite high, she had been off of her medication; she is back on meds now and doing well; smoking less as well  She had the breast biopsy done and was told it wasn't cancer; followed by surgeon  Missed dermatologist appointment for her chronic dermatitis but would like number so she can reschedule that  Her sinuses are bothering her, having some congestion like she can't get it up; nose is burning; nostrils are burning; no infection taste or smell; low grade temperature; ear symptoms of fullness every now and then; bad headache; she has had sinus infection before; she use to take sinus medicine; she has it "real bad"; she does cough and brings some stuff up  Voice is still hoarse; comes and goes some times; cuts off breath sometimes; she has cut down to 3 cigarettes a day, using patch but not smoking at the same time as wearing patch; no alcohol; no palpable lumps in the neck when she feels; gets choked sometimes eating solids in the neck  She was started on sertraline; no side effects; mood is better, but there is still a little room for improvement; willing to increase dose  Relevant past medical, surgical, family and social history reviewed and updated as indicated. Interim medical history since our last visit reviewed. Allergies and medications reviewed and updated.  Review of Systems  Per HPI unless specifically indicated above     Objective:    BP 126/87 mmHg  Pulse 77  Temp(Src) 97.5 F (36.4 C)  Ht 5' 4.5" (1.638 m)  Wt 168 lb  (76.204 kg)  BMI 28.40 kg/m2  SpO2 98%  LMP 03/26/2015 (Approximate)  Wt Readings from Last 3 Encounters:  03/30/15 168 lb (76.204 kg)  03/21/15 172 lb (78.019 kg)  03/08/15 168 lb (76.204 kg)    Physical Exam  Constitutional: She appears well-developed and well-nourished. No distress.  HENT:  Head: Normocephalic and atraumatic.  Right Ear: Hearing normal.  Left Ear: Hearing normal.  Nose: Mucosal edema and rhinorrhea present. Right sinus exhibits maxillary sinus tenderness and frontal sinus tenderness. Left sinus exhibits maxillary sinus tenderness and frontal sinus tenderness.  Mouth/Throat: Mucous membranes are not pale, not dry and not cyanotic. Dental caries present. No oropharyngeal exudate, posterior oropharyngeal edema or posterior oropharyngeal erythema.  Cerumen in both canals; broken tooth (#14) down to gum; voice is very hoarse  Eyes: EOM are normal. No scleral icterus.  Neck: No thyromegaly present.  Cardiovascular: Normal rate, regular rhythm and normal heart sounds.   No murmur heard. Pulmonary/Chest: Effort normal and breath sounds normal. No respiratory distress. She has no wheezes.  Abdominal: Soft. Bowel sounds are normal. She exhibits no distension.  Musculoskeletal: Normal range of motion. She exhibits no edema.  Neurological: She is alert. She exhibits normal muscle tone.  Skin: Skin is warm and dry. She is not diaphoretic. No pallor.  Psychiatric: She has a normal mood and affect. Her behavior is normal. Judgment and thought content  normal.    Results for orders placed or performed during the hospital encounter of 11/09/14  CBC with Differential/Platelet  Result Value Ref Range   WBC 5.9 3.6-11.0 x10 3/mm 3   RBC 4.40 3.80-5.20 X10 6/mm 3   HGB 12.3 12.0-16.0 g/dL   HCT 38.3 35.0-47.0 %   MCV 87 80-100 fL   MCH 27.9 26.0-34.0 pg   MCHC 32.1 32.0-36.0 g/dL   RDW 17.9 (H) 11.5-14.5 %   Platelet 261 150-440 x10 3/mm 3   Neutrophil % 66.1 %   Lymphocyte %  24.3 %   Monocyte % 6.7 %   Eosinophil % 2.5 %   Basophil % 0.4 %   Neutrophil # 3.9 1.4-6.5 x10 3/mm 3   Lymphocyte # 1.4 1.0-3.6 x10 3/mm 3   Monocyte # 0.4 0.2-0.9 x10 3/mm    Eosinophil # 0.1 0.0-0.7 x10 3/mm 3   Basophil # 0.0 0.0-0.1 x10 3/mm 3  Comprehensive metabolic panel  Result Value Ref Range   Glucose, CSF DNP mg/dL   BUN 14 mg/dL   Creatinine 0.65 mg/dL   Sodium, Urine Random DNP mmol/L   Potassium, Urine Random DNP mmol/L   Chloride, Urine Random DNP mmol/L   Co2 25 mmol/L   Calcium, Total 9.2 mg/dL   SGOT(AST) 12 (L) U/L   SGPT (ALT) 16 U/L   Alkaline Phosphatase 67 U/L   Albumin 4.1 g/dL   Total Protein 8.1 g/dL   Bilirubin,Total 0.2 (L) mg/dL   Anion Gap 7 7-16   EGFR (African American) >60    EGFR (Non-African Amer.) >60    Glucose 113 (H) mg/dL   Sodium 138 mmol/L   Potassium 3.8 mmol/L   Chloride 106 mmol/L      Assessment & Plan:   Problem List Items Addressed This Visit      Cardiovascular and Mediastinum   Essential hypertension, benign - Primary    Much improved with medication; praise given; continue meds; check BMP today; DASH guidelines encouraged; she is smoking much less      Relevant Medications   amLODipine (NORVASC) 5 MG tablet   hydrochlorothiazide (HYDRODIURIL) 25 MG tablet     Digestive   Dysphagia    With voice hoarseness, refer to ENT to evaluate for mass or other problem since she is a smoker      Relevant Orders   Ambulatory referral to ENT     Musculoskeletal and Integument   Chronic dermatitis    Phone number given today for derm        Other   Depression, major, recurrent    Improved, but patient believes it could be a little better; increase sertraline to 100 mg daily; new Rx sent; reasons to call before next appt reviewed (inflated mood, hypomania described); she agrees to call before next appt if any problems      Relevant Medications   sertraline (ZOLOFT) 100 MG tablet   Tobacco abuse    So glad she  has cut down on her cigarettews; keep up the good effort and hopefully she will have success quitting altogether      Mass of left breast    Managed by surgeon, appreciate his care      Hoarseness of voice    refer to ENT to evaluate for mass or other problem since she is a smoker      Relevant Orders   Ambulatory referral to ENT   Medication monitoring encounter    Check BMP on the  new meds      Relevant Orders   Basic Metabolic Panel (BMET)    Other Visit Diagnoses    Acute recurrent maxillary sinusitis        Relevant Medications    amoxicillin-clavulanate (AUGMENTIN) 875-125 MG per tablet       Follow up plan: Return in about 3 months (around 06/30/2015).  An after-visit summary was printed and given to the patient at [facility].  Please see the patient instructions which may contain other information and recommendations beyond what is mentioned above in the assessment and plan. Meds ordered this encounter  Medications  . amoxicillin-clavulanate (AUGMENTIN) 875-125 MG per tablet    Sig: Take 1 tablet by mouth 2 (two) times daily.    Dispense:  20 tablet    Refill:  0  . amLODipine (NORVASC) 5 MG tablet    Sig: Take 1 tablet (5 mg total) by mouth daily.    Dispense:  30 tablet    Refill:  11  . hydrochlorothiazide (HYDRODIURIL) 25 MG tablet    Sig: Take 1 tablet (25 mg total) by mouth daily.    Dispense:  30 tablet    Refill:  11  . sertraline (ZOLOFT) 100 MG tablet    Sig: Take 1 tablet (100 mg total) by mouth daily.    Dispense:  30 tablet    Refill:  3   Orders Placed This Encounter  Procedures  . Basic Metabolic Panel (BMET)  . Ambulatory referral to ENT

## 2015-03-30 NOTE — Assessment & Plan Note (Signed)
With voice hoarseness, refer to ENT to evaluate for mass or other problem since she is a smoker

## 2015-03-30 NOTE — Assessment & Plan Note (Signed)
So glad she has cut down on her cigarettews; keep up the good effort and hopefully she will have success quitting altogether

## 2015-03-31 ENCOUNTER — Encounter: Payer: Self-pay | Admitting: Family Medicine

## 2015-03-31 LAB — BASIC METABOLIC PANEL
BUN/Creatinine Ratio: 23 (ref 9–23)
BUN: 15 mg/dL (ref 6–24)
CALCIUM: 10 mg/dL (ref 8.7–10.2)
CHLORIDE: 96 mmol/L — AB (ref 97–108)
CO2: 26 mmol/L (ref 18–29)
Creatinine, Ser: 0.66 mg/dL (ref 0.57–1.00)
GFR calc non Af Amer: 101 mL/min/{1.73_m2} (ref >59)
GFR, EST AFRICAN AMERICAN: 117 mL/min/{1.73_m2} (ref >59)
Glucose: 106 mg/dL — ABNORMAL HIGH (ref 65–99)
Potassium: 4.2 mmol/L (ref 3.5–5.2)
Sodium: 139 mmol/L (ref 134–144)

## 2015-04-07 ENCOUNTER — Telehealth: Payer: Self-pay | Admitting: Family Medicine

## 2015-04-07 NOTE — Telephone Encounter (Signed)
Amy, I so appreciate it Thank you very much Records reviewed

## 2015-04-07 NOTE — Telephone Encounter (Signed)
Please follow-up on the breast imaging reports, she saw Dr. Jamal Collin I see some typed comments in regards to an [place] Did radiologist read this or is that from the surgeon's visualization? Did she actually get her mammograms? I just need to make sure she had all imaging done and we have officiial typed reports of all bilateral breast mammograms and left breast US

## 2015-04-07 NOTE — Telephone Encounter (Signed)
Under chart review->Imaging->US Outside Films Breast is her mammo and u/s report. She was able to get in with surgery before we got her mammo scheduled so Dr. Angie Fava office ordered her mammo and u/s to be done at [place].  If you need more info, let me know and I'll dig deeper.

## 2015-04-19 ENCOUNTER — Encounter
Admission: RE | Admit: 2015-04-19 | Discharge: 2015-04-19 | Disposition: A | Discharge status: Home / Self Care | Payer: Medicaid Other | Source: Ambulatory Visit | Attending: Unknown Physician Specialty | Admitting: Unknown Physician Specialty

## 2015-04-19 DIAGNOSIS — Z01810 Encounter for preprocedural cardiovascular examination: Secondary | ICD-10-CM | POA: Diagnosis present

## 2015-04-19 DIAGNOSIS — Z01812 Encounter for preprocedural laboratory examination: Secondary | ICD-10-CM | POA: Insufficient documentation

## 2015-04-19 HISTORY — DX: Cardiac murmur, unspecified: R01.1

## 2015-04-19 HISTORY — DX: Unspecified convulsions: R56.9

## 2015-04-19 HISTORY — DX: Headache: R51

## 2015-04-19 HISTORY — DX: Unspecified osteoarthritis, unspecified site: M19.90

## 2015-04-19 HISTORY — DX: Anemia, unspecified: D64.9

## 2015-04-19 HISTORY — DX: Headache, unspecified: R51.9

## 2015-04-19 LAB — BASIC METABOLIC PANEL
ANION GAP: 7 (ref 5–15)
BUN: 20 mg/dL (ref 6–20)
CALCIUM: 9.5 mg/dL (ref 8.9–10.3)
CO2: 25 mmol/L (ref 22–32)
Chloride: 107 mmol/L (ref 101–111)
Creatinine, Ser: 0.72 mg/dL (ref 0.44–1.00)
GFR calc non Af Amer: >60 mL/min (ref >60)
GLUCOSE: 123 mg/dL — AB (ref 65–99)
POTASSIUM: 3.9 mmol/L (ref 3.5–5.1)
Sodium: 139 mmol/L (ref 135–145)

## 2015-04-19 NOTE — OR Nursing (Signed)
REQUEST FOR CLEARANCE BY DR ADAMS FAXED AND CALLED TO DR LADA. SPOKE WITH STEPHANIE. ALSO CALLED AND FAXED TO [place] AT [facility] OFFICE.

## 2015-04-19 NOTE — Patient Instructions (Signed)
  Your procedure is scheduled AJ:GOTLXBWIO 20, 2016 (Tuesday) Report to Day Surgery.[facility]) To find out your arrival time please call [phone] between 1PM - 3PM on April 25, 2015 (Monday)  Remember: Instructions that are not followed completely may result in serious medical risk, up to and including death, or upon the discretion of your surgeon and anesthesiologist your surgery may need to be rescheduled.    __x__ 1. Do not eat food or drink liquids after midnight. No gum chewing or hard candies.     __x_2. No Alcohol for 24 hours before or after surgery.   ____ 3. Bring all medications with you on the day of surgery if instructed.    __x__ 4. Notify your doctor if there is any change in your medical condition     (cold, fever, infections).     Do not wear jewelry, make-up, hairpins, clips or nail polish.  Do not wear lotions, powders, or perfumes. You may wear deodorant.  Do not shave 48 hours prior to surgery. Men may shave face and neck.  Do not bring valuables to the hospital.    [facility] is not responsible for any belongings or valuables.               Contacts, dentures or bridgework may not be worn into surgery.  Leave your suitcase in the car. After surgery it may be brought to your room.  For patients admitted to the hospital, discharge time is determined by your                treatment team.   Patients discharged the day of surgery will not be allowed to drive home.   Please read over the following fact sheets that you were given:   Surgical Site Infection Prevention   ____ Take these medicines the morning of surgery with A SIP OF WATER:    1. Amlodipine  2. Zoloft  3.   4.  5.  6.  ____ Fleet Enema (as directed)   ____ Use CHG Soap as directed olish.     ____ Fleet Enema (as directed)   ____ Use CHG Soap as directed  ____ Use inhalers on the day of surgery  ____ Stop metformin 2 days prior to surgery    ____ Take 1/2 of usual insulin  dose the night before surgery and none on the morning of surgery.   ____ Stop Coumadin/Plavix/aspirin on   ____ Stop Anti-inflammatories on (Tylenol ok to take for pain)   ____ Stop supplements until after surgery.    ____ Bring C-Pap to the hospital.

## 2015-04-21 ENCOUNTER — Telehealth: Payer: Self-pay | Admitting: Family Medicine

## 2015-04-21 NOTE — Telephone Encounter (Signed)
Pt called in and said "something" was too low and she wanted to know if she needed to still go through with the surgery or if she was supposed to wait. Pt wasn't sure what was too low she thinks it was gk something(sorry dont know anything else)

## 2015-04-21 NOTE — Telephone Encounter (Signed)
I just got another form though from her ENT doctor She needs medical clearance and a repeat EKG before her procedure (planned for the 20th); please work her in for an appt tomorrow (Friday) for pre-op and EKG

## 2015-04-21 NOTE — Telephone Encounter (Signed)
Her chloride was a little bit low, but that is not a problem at all and she can go ahead with her surgery as planned I'm glad she wanted to double check

## 2015-04-21 NOTE — Telephone Encounter (Signed)
Looks like you know what going on w this.Marland KitchenMarland KitchenMarland Kitchen

## 2015-04-21 NOTE — Telephone Encounter (Signed)
Called patient to schedule a medical clearance appointment for tomorrow either 8:45am or 1pm. Patient did not answer the phone therefore I left a vm to call us back.

## 2015-04-22 ENCOUNTER — Ambulatory Visit (INDEPENDENT_AMBULATORY_CARE_PROVIDER_SITE_OTHER): Payer: Medicaid Other | Admitting: Family Medicine

## 2015-04-22 ENCOUNTER — Encounter: Payer: Self-pay | Admitting: Family Medicine

## 2015-04-22 VITALS — BP 145/81 | HR 79 | Temp 97.6°F | Ht 63.6 in | Wt 170.4 lb

## 2015-04-22 DIAGNOSIS — Z23 Encounter for immunization: Secondary | ICD-10-CM

## 2015-04-22 DIAGNOSIS — M79602 Pain in left arm: Secondary | ICD-10-CM

## 2015-04-22 DIAGNOSIS — R5382 Chronic fatigue, unspecified: Secondary | ICD-10-CM

## 2015-04-22 DIAGNOSIS — R9431 Abnormal electrocardiogram [ECG] [EKG]: Secondary | ICD-10-CM

## 2015-04-22 DIAGNOSIS — R079 Chest pain, unspecified: Secondary | ICD-10-CM | POA: Diagnosis not present

## 2015-04-22 NOTE — Assessment & Plan Note (Signed)
I called Dr. Tyrell Antonio office to see about getting a stress test prior to her surgery planned for Tuesday at 9:00 am; patient to arrive at 8:45 am; start aspirin 81 mg coated daily; check lipids today

## 2015-04-22 NOTE — Patient Instructions (Addendum)
You will have a stress test on Monday at [facility], Dr. Tyrell Antonio office at 9:00 am Arrive at 8:45 am on Monday; comfortable shoes, treadmill stress test Light breakfast is okay Start 81 mg coated baby aspirin daily Call 911 if any chest pain before your stress test We'll check labs today

## 2015-04-22 NOTE — Progress Notes (Signed)
BP 145/81 mmHg  Pulse 79  Temp(Src) 97.6 F (36.4 C)  Ht 5' 3.6" (1.615 m)  Wt 170 lb 6.4 oz (77.293 kg)  BMI 29.63 kg/m2  SpO2 98%  LMP 03/26/2015 (Approximate)   Subjective:    Patient ID: Allison Cooke, female    DOB: 10-10-1960, 54 y.o.   MRN: [id number]  HPI: Allison Cooke is a 54 y.o. female  Chief Complaint  Patient presents with  . OTHER    pt is here for medical clearence   She is going to have surgery on Tuesday for her vocal cords; I received a form from her ENT and she was noted to have an abnormal EKG and is seen today to get some history and repeat the EKG She says that she has had chest pain and left arm pain on and off for a while; she has not had a stress test Her cardiac risk factors include smoking, hypertension Her family hx of heart disease in her sister with CHF but there have been no heart attacks in the family  She has not had a lipid panel done here yet, as she just established care in July and records were requested from her previous doctor, but I have not seen her lipid panel yet  BMP just rechecked during pre-op labwork, and the chloride was back to normal  Relevant past medical, surgical, family and social history reviewed and updated as indicated. Interim medical history since our last visit reviewed. Allergies and medications reviewed and updated.  Review of Systems Per HPI unless specifically indicated above     Objective:    BP 145/81 mmHg  Pulse 79  Temp(Src) 97.6 F (36.4 C)  Ht 5' 3.6" (1.615 m)  Wt 170 lb 6.4 oz (77.293 kg)  BMI 29.63 kg/m2  SpO2 98%  LMP 03/26/2015 (Approximate)  Wt Readings from Last 3 Encounters:  04/22/15 170 lb 6.4 oz (77.293 kg)  04/19/15 168 lb (76.204 kg)  03/30/15 168 lb (76.204 kg)    Physical Exam  Constitutional: She appears well-developed and well-nourished.  HENT:  Mouth/Throat: Oropharynx is clear and moist and mucous membranes are normal.  Voice is raspy and hoarse  Eyes: EOM are  normal. No scleral icterus.  Neck: Neck supple.  Cardiovascular: Normal rate, regular rhythm and normal heart sounds.   No extrasystoles are present. Exam reveals no gallop and no distant heart sounds.   No murmur heard. Pulmonary/Chest: Effort normal and breath sounds normal. No respiratory distress. She has no wheezes. She has no rales.  Abdominal: She exhibits no distension.  Musculoskeletal: She exhibits no edema.  Neurological: She is alert. Coordination normal.  Skin: Skin is warm and dry. No erythema.  Psychiatric: She has a normal mood and affect.   Results for orders placed or performed during the hospital encounter of 40/98/11  Basic metabolic panel  Result Value Ref Range   Sodium 139 135 - 145 mmol/L   Potassium 3.9 3.5 - 5.1 mmol/L   Chloride 107 101 - 111 mmol/L   CO2 25 22 - 32 mmol/L   Glucose, Bld 123 (H) 65 - 99 mg/dL   BUN 20 6 - 20 mg/dL   Creatinine, Ser 0.72 0.44 - 1.00 mg/dL   Calcium 9.5 8.9 - 10.3 mg/dL   GFR calc non Af Amer >60 >60 mL/min   GFR calc Af Amer >60 >60 mL/min   Anion gap 7 5 - 15      Assessment & Plan:  Problem List Items Addressed This Visit      Other   Abnormal EKG - Primary    I called Dr. Tyrell Antonio office to see about getting a stress test prior to her surgery planned for Tuesday at 9:00 am; patient to arrive at 8:45 am; start aspirin 81 mg coated daily; check lipids today      Relevant Orders   EKG 12-Lead (Completed)   Exercise Tolerance Test   Lipid Panel w/o Chol/HDL Ratio    Other Visit Diagnoses    Immunization due        Relevant Orders    Flu Vaccine QUAD 36+ mos PF IM (Fluarix & Fluzone Quad PF) (Completed)    Left arm pain        Relevant Orders    Exercise Tolerance Test    Lipid Panel w/o Chol/HDL Ratio    Chest pain, unspecified chest pain type        Relevant Orders    Lipid Panel w/o Chol/HDL Ratio    Chronic fatigue        Relevant Orders    TSH    Vit D  25 hydroxy (rtn osteoporosis monitoring)     Vitamin B12       Follow up plan: No Follow-up on file.  An after-visit summary was printed and given to the patient at [place].  Please see the patient instructions which may contain other information and recommendations beyond what is mentioned above in the assessment and plan.

## 2015-04-22 NOTE — Telephone Encounter (Signed)
Pt is coming in this afternoon and [facility] is sending over clearance paperwork

## 2015-04-25 ENCOUNTER — Telehealth: Payer: Self-pay | Admitting: Family Medicine

## 2015-04-25 ENCOUNTER — Ambulatory Visit (INDEPENDENT_AMBULATORY_CARE_PROVIDER_SITE_OTHER): Payer: Medicaid Other

## 2015-04-25 ENCOUNTER — Ambulatory Visit: Payer: Medicaid Other | Admitting: General Surgery

## 2015-04-25 ENCOUNTER — Telehealth: Payer: Self-pay

## 2015-04-25 ENCOUNTER — Telehealth: Payer: Self-pay | Admitting: Cardiovascular Disease

## 2015-04-25 DIAGNOSIS — M79602 Pain in left arm: Secondary | ICD-10-CM

## 2015-04-25 DIAGNOSIS — R9431 Abnormal electrocardiogram [ECG] [EKG]: Secondary | ICD-10-CM

## 2015-04-25 LAB — EXERCISE TOLERANCE TEST
CHL CUP MPHR: 167 {beats}/min
CHL CUP RESTING HR STRESS: 90 {beats}/min
CSEPEDS: 0 s
CSEPHR: 88 %
Estimated workload: 7.1 METS
Exercise duration (min): 6 min
Peak HR: 148 {beats}/min

## 2015-04-25 NOTE — Telephone Encounter (Signed)
Transferred rn Becky to [employer]

## 2015-04-25 NOTE — Telephone Encounter (Signed)
Received cardiac clearance letter from Dr. Melven Sartorius McQueen's office. Pt here today for ETT but has not been seen by physician in our office. ETT results faxed to Dr. Sanda Klein S/w Colletta [place] at [place] office who states MD as well as her nurse out of office today. Colletta Maryland states she is unsure why we received request for clearance as they have spoken with Dr. Ileene Hutchinson office to notify them Dr. Sanda Klein will address clearance tomorrow morning.

## 2015-04-25 NOTE — Telephone Encounter (Signed)
Stress test was negative, great news I called patient, left detailed msg, passed her stress test, cleared for surgery tomorrow I called ENT office; spoke with staff, gave clearance for her surgery; gave my cell # in case they had any questions

## 2015-04-26 ENCOUNTER — Ambulatory Visit: Payer: Medicaid Other | Admitting: Anesthesiology

## 2015-04-26 ENCOUNTER — Encounter
Admission: RE | Disposition: A | Discharge status: Home / Self Care | Payer: Self-pay | Source: Ambulatory Visit | Attending: Unknown Physician Specialty

## 2015-04-26 ENCOUNTER — Encounter: Payer: Self-pay | Admitting: *Deleted

## 2015-04-26 ENCOUNTER — Ambulatory Visit
Admission: RE | Admit: 2015-04-26 | Discharge: 2015-04-26 | Disposition: A | Discharge status: Home / Self Care | Payer: Medicaid Other | Source: Ambulatory Visit | Attending: Unknown Physician Specialty | Admitting: Unknown Physician Specialty

## 2015-04-26 DIAGNOSIS — Z841 Family history of disorders of kidney and ureter: Secondary | ICD-10-CM | POA: Diagnosis not present

## 2015-04-26 DIAGNOSIS — Z8249 Family history of ischemic heart disease and other diseases of the circulatory system: Secondary | ICD-10-CM | POA: Insufficient documentation

## 2015-04-26 DIAGNOSIS — Z833 Family history of diabetes mellitus: Secondary | ICD-10-CM | POA: Insufficient documentation

## 2015-04-26 DIAGNOSIS — Z9049 Acquired absence of other specified parts of digestive tract: Secondary | ICD-10-CM | POA: Diagnosis not present

## 2015-04-26 DIAGNOSIS — F17200 Nicotine dependence, unspecified, uncomplicated: Secondary | ICD-10-CM | POA: Insufficient documentation

## 2015-04-26 DIAGNOSIS — R490 Dysphonia: Secondary | ICD-10-CM | POA: Diagnosis present

## 2015-04-26 DIAGNOSIS — J45909 Unspecified asthma, uncomplicated: Secondary | ICD-10-CM | POA: Diagnosis not present

## 2015-04-26 DIAGNOSIS — I10 Essential (primary) hypertension: Secondary | ICD-10-CM | POA: Diagnosis not present

## 2015-04-26 DIAGNOSIS — Z79899 Other long term (current) drug therapy: Secondary | ICD-10-CM | POA: Insufficient documentation

## 2015-04-26 DIAGNOSIS — D649 Anemia, unspecified: Secondary | ICD-10-CM | POA: Diagnosis not present

## 2015-04-26 DIAGNOSIS — Z83511 Family history of glaucoma: Secondary | ICD-10-CM | POA: Diagnosis not present

## 2015-04-26 DIAGNOSIS — F419 Anxiety disorder, unspecified: Secondary | ICD-10-CM | POA: Insufficient documentation

## 2015-04-26 DIAGNOSIS — K219 Gastro-esophageal reflux disease without esophagitis: Secondary | ICD-10-CM | POA: Diagnosis not present

## 2015-04-26 DIAGNOSIS — J381 Polyp of vocal cord and larynx: Secondary | ICD-10-CM | POA: Insufficient documentation

## 2015-04-26 DIAGNOSIS — E119 Type 2 diabetes mellitus without complications: Secondary | ICD-10-CM | POA: Insufficient documentation

## 2015-04-26 HISTORY — PX: DIRECT LARYNGOSCOPY: SHX5326

## 2015-04-26 LAB — GLUCOSE, CAPILLARY
GLUCOSE-CAPILLARY: 123 mg/dL — AB (ref 65–99)
GLUCOSE-CAPILLARY: 138 mg/dL — AB (ref 65–99)

## 2015-04-26 LAB — POCT PREGNANCY, URINE: Preg Test, Ur: NEGATIVE

## 2015-04-26 SURGERY — LARYNGOSCOPY, DIRECT
Anesthesia: General | Wound class: Clean Contaminated

## 2015-04-26 MED ORDER — FENTANYL CITRATE (PF) 100 MCG/2ML IJ SOLN
INTRAMUSCULAR | Status: AC
  Filled 2015-04-26: qty 2

## 2015-04-26 MED ORDER — ONDANSETRON HCL 4 MG/2ML IJ SOLN: 4.0000 mg | Freq: Once | INTRAMUSCULAR | Status: DC | PRN

## 2015-04-26 MED ORDER — PROPOFOL 10 MG/ML IV BOLUS
INTRAVENOUS | Status: DC | PRN
  Administered 2015-04-26: 40 mg via INTRAVENOUS
  Administered 2015-04-26: 130 mg via INTRAVENOUS

## 2015-04-26 MED ORDER — MIDAZOLAM HCL 2 MG/2ML IJ SOLN
INTRAMUSCULAR | Status: DC | PRN
  Administered 2015-04-26: 2 mg via INTRAVENOUS

## 2015-04-26 MED ORDER — SUGAMMADEX SODIUM 200 MG/2ML IV SOLN
INTRAVENOUS | Status: DC | PRN
  Administered 2015-04-26: 150 mg via INTRAVENOUS

## 2015-04-26 MED ORDER — FENTANYL CITRATE (PF) 100 MCG/2ML IJ SOLN
INTRAMUSCULAR | Status: DC | PRN
  Administered 2015-04-26: 50 ug via INTRAVENOUS
  Administered 2015-04-26: 100 ug via INTRAVENOUS

## 2015-04-26 MED ORDER — PHENYLEPHRINE HCL 10 % OP SOLN
OPHTHALMIC | Status: DC | PRN
  Administered 2015-04-26: 2 mL via TOPICAL

## 2015-04-26 MED ORDER — WHITE PETROLATUM GEL
Status: AC
  Filled 2015-04-26: qty 5

## 2015-04-26 MED ORDER — FENTANYL CITRATE (PF) 100 MCG/2ML IJ SOLN
INTRAMUSCULAR | Status: AC
  Administered 2015-04-26: 25 ug
  Filled 2015-04-26: qty 2

## 2015-04-26 MED ORDER — LIDOCAINE-EPINEPHRINE 1 %-1:100000 IJ SOLN
INTRAMUSCULAR | Status: AC
  Filled 2015-04-26: qty 1

## 2015-04-26 MED ORDER — PHENYLEPHRINE HCL 10 % OP SOLN
Freq: Once | OPHTHALMIC | Status: DC
  Filled 2015-04-26: qty 10

## 2015-04-26 MED ORDER — GLYCOPYRROLATE 0.2 MG/ML IJ SOLN
INTRAMUSCULAR | Status: DC | PRN
  Administered 2015-04-26: 0.2 mg via INTRAVENOUS

## 2015-04-26 MED ORDER — SODIUM CHLORIDE 0.9 % IV SOLN
INTRAVENOUS | Status: DC
Start: 2015-04-26 — End: 2015-04-26
  Administered 2015-04-26: 08:00:00 via INTRAVENOUS

## 2015-04-26 MED ORDER — HYDROCODONE-ACETAMINOPHEN 5-300 MG PO TABS: 1.0000 | ORAL_TABLET | ORAL | Status: DC | PRN

## 2015-04-26 MED ORDER — LABETALOL HCL 5 MG/ML IV SOLN
INTRAVENOUS | Status: DC | PRN
  Administered 2015-04-26: 2.5 mg via INTRAVENOUS

## 2015-04-26 MED ORDER — FAMOTIDINE 20 MG PO TABS
20.0000 mg | ORAL_TABLET | Freq: Once | ORAL | Status: AC
  Administered 2015-04-26: 20 mg via ORAL

## 2015-04-26 MED ORDER — ROCURONIUM BROMIDE 100 MG/10ML IV SOLN
INTRAVENOUS | Status: DC | PRN
  Administered 2015-04-26: 40 mg via INTRAVENOUS

## 2015-04-26 MED ORDER — FENTANYL CITRATE (PF) 100 MCG/2ML IJ SOLN
25.0000 ug | INTRAMUSCULAR | Status: DC | PRN
  Administered 2015-04-26 (×5): 25 ug via INTRAVENOUS

## 2015-04-26 MED ORDER — FAMOTIDINE 20 MG PO TABS
ORAL_TABLET | ORAL | Status: AC
  Administered 2015-04-26: 20 mg via ORAL
  Filled 2015-04-26: qty 1

## 2015-04-26 MED ORDER — DEXAMETHASONE SODIUM PHOSPHATE 4 MG/ML IJ SOLN
INTRAMUSCULAR | Status: DC | PRN
  Administered 2015-04-26: 10 mg via INTRAVENOUS

## 2015-04-26 SURGICAL SUPPLY — 21 items
CANISTER SUCT 1200ML W/VALVE (MISCELLANEOUS) ×3 IMPLANT
CUP MEDICINE 2OZ PLAST GRAD ST (MISCELLANEOUS) ×3 IMPLANT
DRAPE TABLE BACK 80X90 (DRAPES) ×3 IMPLANT
DRESSING TELFA 4X3 1S ST N-ADH (GAUZE/BANDAGES/DRESSINGS) ×3 IMPLANT
GLOVE BIO SURGEON STRL SZ7.5 (GLOVE) ×5 IMPLANT
GOWN STRL REUS W/ TWL LRG LVL3 (GOWN DISPOSABLE) ×1 IMPLANT
GOWN STRL REUS W/TWL LRG LVL3 (GOWN DISPOSABLE) ×3
NDL FILTER BLUNT 18X1 1/2 (NEEDLE) ×1 IMPLANT
NDL HYPO 25GX1X1/2 BEV (NEEDLE) ×1 IMPLANT
NDL SAFETY 18GX1.5 (NEEDLE) ×3 IMPLANT
NEEDLE FILTER BLUNT 18X 1/2SAF (NEEDLE) ×2
NEEDLE FILTER BLUNT 18X1 1/2 (NEEDLE) ×1 IMPLANT
NEEDLE HYPO 25GX1X1/2 BEV (NEEDLE) ×3 IMPLANT
PATTIES SURGICAL .5 X.5 (GAUZE/BANDAGES/DRESSINGS) ×3 IMPLANT
SOL ANTI-FOG 6CC FOG-OUT (MISCELLANEOUS) ×1 IMPLANT
SOL FOG-OUT ANTI-FOG 6CC (MISCELLANEOUS) ×2
SPONGE XRAY 4X4 16PLY STRL (MISCELLANEOUS) ×3 IMPLANT
SYRINGE 10CC LL (SYRINGE) ×3 IMPLANT
TOWEL OR 17X26 4PK STRL BLUE (TOWEL DISPOSABLE) ×3 IMPLANT
TUBING CONNECTING 10 (TUBING) ×2 IMPLANT
TUBING CONNECTING 10' (TUBING) ×1

## 2015-04-26 NOTE — Transfer of Care (Signed)
Immediate Anesthesia Transfer of Care Note  Patient: Allison Cooke  Procedure(s) Performed: Procedure(s): Microlaryngoscopy with exxcision of vocal cord polyp  (N/A)  Patient Location: PACU  Anesthesia Type:General  Level of Consciousness: patient cooperative and lethargic  Airway & Oxygen Therapy: Patient Spontanous Breathing and Patient connected to face mask oxygen  Post-op Assessment: Report given to RN and Post -op Vital signs reviewed and stable  Post vital signs: Reviewed and stable  Last Vitals:  Filed Vitals:   04/26/15 0934  BP: 109/75  Pulse: 91  Temp: 36.7 C  Resp: 20    Complications: No apparent anesthesia complications

## 2015-04-26 NOTE — Anesthesia Preprocedure Evaluation (Signed)
Anesthesia Evaluation  Patient identified by MRN, date of birth, ID band Patient awake    Reviewed: Allergy & Precautions, NPO status , Patient's Chart, lab work & pertinent test results  History of Anesthesia Complications Negative for: history of anesthetic complications  Airway Mallampati: II  TM Distance: >3 FB Neck ROM: Full    Dental  (+) Teeth Intact   Pulmonary shortness of breath (2ndary to poylyps), Current Smoker (1/2 ppd),           Cardiovascular hypertension, Pt. on medications + Valvular Problems/Murmurs (as a child)      Neuro/Psych  Headaches, Seizures - (as a child x 1),  Depression    GI/Hepatic GERD (no meds)  ,  Endo/Other  diabetes (diet controlled)  Renal/GU      Musculoskeletal   Abdominal   Peds  Hematology  (+) anemia ,   Anesthesia Other Findings   Reproductive/Obstetrics                             Anesthesia Physical Anesthesia Plan  ASA: II  Anesthesia Plan: General   Post-op Pain Management:    Induction: Intravenous  Airway Management Planned: Oral ETT  Additional Equipment:   Intra-op Plan:   Post-operative Plan:   Informed Consent: I have reviewed the patients History and Physical, chart, labs and discussed the procedure including the risks, benefits and alternatives for the proposed anesthesia with the patient or authorized representative who has indicated his/her understanding and acceptance.     Plan Discussed with:   Anesthesia Plan Comments:         Anesthesia Quick Evaluation

## 2015-04-26 NOTE — Op Note (Signed)
04/26/2015  9:15 AM    Allison Cooke  [id number]   Pre-Op Dx: [name]  Post-op Dx: SAME  Proc: Microlaryngoscopy with excision of bilateral vocal cord polyps   Surg:  Roena Malady  Anes:  GOT  EBL:  Less than 10 cc  Comp:  None  Findings:  Bilateral pedunculated but cord polyps  Procedure: 20 was identified in the holding area and taken to the operating room. After general trach anesthesia the table was turned 90. A tooth guard was placed a Dedo laryngoscope was introduced into the airway and suspended. Examination larynx showed large polypoid masses emanating from the right and left vocal folds. A 0 Hopkins rod was introduced into the airway and photodocumentation was performed. Cottonoid pledgets with phenylephrine lidocaine solution were placed within the larynx. Approximate 5 minutes these were removed. The operating my prescription is and brought into the field using the [place]a microlaryngeal instruments gaining on the left hand side the 45 forceps were used to grasp the polypoid mass in the 45 scissors were used to excise this from the underlying mucosa. In similar fashion the large polyp was removed from the left vocal fold. There was also a polypoid disease at the anterior commissure greater care was taken not to disrupt the mucosa in the anterior commissure to prevent anterior webbing small polypoid this were used were removed using the 45 forceps. With all polypoid disease removed cottonoid pledgets with phenylephrine and lidocaine solution were placed within the larynx for hemostasis. After proximal and 5 minutes these were removed reinspection of the larynx using the microscope showed the polypoid disease had been adequately removed Hopkins rod was then brought back into the field and the photodocumentation completing the procedure was performed. The patient was then returned to anesthesia where she was extubated in the operating room taken recovery  room stable condition  Dispo:   Good  Plan:  She will be discharged home from PACU to follow up with me in 1 week  MCQUEEN,CHAPMAN T  04/26/2015 9:15 AM

## 2015-04-26 NOTE — Anesthesia Procedure Notes (Signed)
Procedure Name: Intubation Date/Time: 04/26/2015 8:44 AM Performed by: Jonna Clark Pre-anesthesia Checklist: Patient identified, Patient being monitored, Timeout performed, Emergency Drugs available and Suction available Patient Re-evaluated:Patient Re-evaluated prior to inductionOxygen Delivery Method: Circle system utilized Preoxygenation: Pre-oxygenation with 100% oxygen Intubation Type: IV induction Ventilation: Mask ventilation without difficulty Laryngoscope Size: Mac and 3 Grade View: Grade II Tube type: Oral Tube size: 6.0 mm Number of attempts: 1 Placement Confirmation: ETT inserted through vocal cords under direct vision,  positive ETCO2 and breath sounds checked- equal and bilateral Secured at: 21 cm Tube secured with: Tape Dental Injury: Teeth and Oropharynx as per pre-operative assessment

## 2015-04-26 NOTE — H&P (Signed)
  H+P  Reviewed and will be scanned in later. No changes noted. 

## 2015-04-26 NOTE — Discharge Instructions (Signed)

## 2015-04-26 NOTE — Anesthesia Postprocedure Evaluation (Signed)
  Anesthesia Post-op Note  Patient: Allison Cooke  Procedure(s) Performed: Procedure(s): Microlaryngoscopy with exxcision of vocal cord polyp  (N/A)  Anesthesia type:General  Patient location: PACU  Post pain: Pain level controlled  Post assessment: Post-op Vital signs reviewed, Patient's Cardiovascular Status Stable, Respiratory Function Stable, Patent Airway and No signs of Nausea or vomiting  Post vital signs: Reviewed and stable  Last Vitals:  Filed Vitals:   04/26/15 1000  BP: 137/90  Pulse: 75  Temp:   Resp: 16    Level of consciousness: awake, alert  and patient cooperative  Complications: No apparent anesthesia complications

## 2015-04-27 LAB — SURGICAL PATHOLOGY

## 2015-06-02 ENCOUNTER — Encounter: Payer: Self-pay | Admitting: *Deleted

## 2015-06-19 ENCOUNTER — Emergency Department
Admission: EM | Admit: 2015-06-19 | Discharge: 2015-06-19 | Disposition: A | Discharge status: Home / Self Care | Payer: Medicaid Other | Attending: Student | Admitting: Student

## 2015-06-19 ENCOUNTER — Encounter: Payer: Self-pay | Admitting: *Deleted

## 2015-06-19 DIAGNOSIS — I10 Essential (primary) hypertension: Secondary | ICD-10-CM | POA: Diagnosis not present

## 2015-06-19 DIAGNOSIS — R21 Rash and other nonspecific skin eruption: Secondary | ICD-10-CM | POA: Diagnosis present

## 2015-06-19 DIAGNOSIS — Z79899 Other long term (current) drug therapy: Secondary | ICD-10-CM | POA: Diagnosis not present

## 2015-06-19 DIAGNOSIS — F17210 Nicotine dependence, cigarettes, uncomplicated: Secondary | ICD-10-CM | POA: Insufficient documentation

## 2015-06-19 DIAGNOSIS — L245 Irritant contact dermatitis due to other chemical products: Secondary | ICD-10-CM | POA: Diagnosis not present

## 2015-06-19 MED ORDER — HYDROCORTISONE 0.5 % EX CREA: 1.0000 "application " | TOPICAL_CREAM | Freq: Two times a day (BID) | CUTANEOUS | Status: DC

## 2015-06-19 MED ORDER — DIPHENHYDRAMINE HCL 25 MG PO CAPS
25.0000 mg | ORAL_CAPSULE | Freq: Once | ORAL | Status: AC
  Administered 2015-06-19: 25 mg via ORAL
  Filled 2015-06-19: qty 1

## 2015-06-19 MED ORDER — ACETAMINOPHEN 500 MG PO TABS
1000.0000 mg | ORAL_TABLET | Freq: Once | ORAL | Status: AC
  Administered 2015-06-19: 1000 mg via ORAL
  Filled 2015-06-19: qty 2

## 2015-06-19 NOTE — ED Notes (Signed)
Reviewed d/c instructions, prescriptions and follow-up care with pt. Pt verbalized understanding. 

## 2015-06-19 NOTE — ED Provider Notes (Signed)
[facility] Emergency Department Provider Note  ____________________________________________  Time seen: Approximately 6:08 AM  I have reviewed the triage vital signs and the nursing notes.   HISTORY  Chief Complaint Allergic Reaction    HPI Allison Cooke is a 54 y.o. female with hypertension diabetes who presents for evaluation of gradual onset scalp itching and rash ongoing for 2 days, constant since onset, moderate to severe, no modifying factors. The patient reports that she applied hair dye to her scalp 2 days ago. She reports that she has previously had an allergic reaction to commercial chemical hair dyes and this is the second time that she has developed a painful itchy rash after dying or relaxing her hair. She has washed her hair several times since applying the hair dye however her symptoms remain. No shortness of breath, no rash elsewhere on her body. No recent illness including no cough, vomiting, diarrhea, fevers or chills. No chest pain or difficulty breathing.   Past Medical History  Diagnosis Date  . Hypertension   . Pre-diabetes   . SOB (shortness of breath)   . Heart murmur   . Headache   . Arthritis   . Anemia   . Seizures ([place])     as a child    Patient Active Problem List   Diagnosis Date Noted  . Abnormal EKG 04/22/2015  . Dysphagia 03/30/2015  . Medication monitoring encounter 03/30/2015  . Essential hypertension, benign 03/02/2015  . Bloody discharge from left nipple 03/02/2015  . Depression, major, recurrent ([place]) 03/02/2015  . Shortness of breath 03/02/2015  . Tobacco abuse 03/02/2015  . Chronic dermatitis 03/02/2015  . Mass of left breast 03/02/2015  . Hoarseness of voice 03/02/2015    Past Surgical History  Procedure Laterality Date  . Cholecystectomy    . Cesarean section    . Fine needle aspiration Left     breast cyst, benign, age 73?  Marland Kitchen Breast surgery Left   . Direct laryngoscopy N/A 04/26/2015     Procedure: Microlaryngoscopy with exxcision of vocal cord polyp ;  Surgeon: Beverly Gust, MD;  Location: [facility] ORS;  Service: ENT;  Laterality: N/A;    Current Outpatient Rx  Name  Route  Sig  Dispense  Refill  . amLODipine (NORVASC) 5 MG tablet   Oral   Take 1 tablet (5 mg total) by mouth daily.   30 tablet   11   . hydrochlorothiazide (HYDRODIURIL) 25 MG tablet   Oral   Take 1 tablet (25 mg total) by mouth daily.   30 tablet   11   . Hydrocodone-Acetaminophen 5-300 MG TABS   Oral   Take 1-2 tablets by mouth every 4 (four) hours as needed.   30 each   0   . hydrocortisone cream 0.5 %   Topical   Apply 1 application topically 2 (two) times daily.   30 g   0   . nicotine (EQ NICOTINE) 7 mg/24hr patch   Transdermal   Place 1 patch (7 mg total) onto the skin daily.   28 patch   0   . sertraline (ZOLOFT) 100 MG tablet   Oral   Take 1 tablet (100 mg total) by mouth daily.   30 tablet   3   . triamcinolone cream (KENALOG) 0.5 %   Topical   Apply 1 application topically 2 (two) times daily as needed. Too strong for face, under arms, or in groin   60 g   1  Allergies Shrimp  Family History  Problem Relation Age of Onset  . Hypertension Mother   . Heart disease Mother   . Hyperlipidemia Mother   . Diabetes Mother   . Heart disease Father   . Hyperlipidemia Father   . Hypertension Father   . Breast cancer Cousin     Social History Social History  Substance Use Topics  . Smoking status: Current Some Day Smoker -- 0.25 packs/day for 20 years    Types: Cigarettes  . Smokeless tobacco: Never Used  . Alcohol Use: No    Review of Systems Constitutional: No fever/chills Eyes: No visual changes. ENT: No sore throat. Cardiovascular: Denies chest pain. Respiratory: Denies shortness of breath. Gastrointestinal: No abdominal pain.  No nausea, no vomiting.  No diarrhea.  No constipation. Genitourinary: Negative for dysuria. Musculoskeletal: Negative for  back pain. Skin: + for rash. Neurological: Negative for headaches, focal weakness or numbness.  10-point ROS otherwise negative.  ____________________________________________   PHYSICAL EXAM:  VITAL SIGNS: ED Triage Vitals  Enc Vitals Group     BP 06/19/15 0556 148/89 mmHg     Pulse Rate 06/19/15 0556 99     Resp 06/19/15 0556 20     Temp 06/19/15 0556 97.8 F (36.6 C)     Temp Source 06/19/15 0556 Oral     SpO2 06/19/15 0556 95 %     Weight 06/19/15 0556 165 lb (74.844 kg)     Height 06/19/15 0556 5\' 2"  (1.575 m)     Head Cir --      Peak Flow --      Pain Score 06/19/15 0558 7     Pain Loc --      Pain Edu? --      Excl. in [place]? --     Constitutional: Alert and oriented. Well appearing and in no acute distress. Eyes: Conjunctivae are normal. PERRL. EOMI. Head: Atraumatic. Nose: No congestion/rhinnorhea. Mouth/Throat: Mucous membranes are moist.  Oropharynx non-erythematous. Neck: No stridor.  Cardiovascular: Normal rate, regular rhythm. Grossly normal heart sounds.  Good peripheral circulation. Respiratory: Normal respiratory effort.  No retractions. Lungs CTAB. Gastrointestinal: Soft and nontender. No distention.  No CVA tenderness. Genitourinary: deferred Musculoskeletal: No lower extremity tenderness nor edema.  No joint effusions. Neurologic:  Normal speech and language. No gross focal neurologic deficits are appreciated. No gait instability. Skin:  Skin is warm, dry and intact. Fine papular raised rash throughout the scalp, some areas with surrounding erythema, without involvement of the mucous membranes or palms of the hands. Psychiatric: Mood and affect are normal. Speech and behavior are normal.  ____________________________________________   LABS (all labs ordered are listed, but only abnormal results are displayed)  Labs Reviewed - No data to  display ____________________________________________  EKG  none ____________________________________________  RADIOLOGY  none ____________________________________________   PROCEDURES  Procedure(s) performed: None  Critical Care performed: No  ____________________________________________   INITIAL IMPRESSION / ASSESSMENT AND PLAN / ED COURSE  Pertinent labs & imaging results that were available during my care of the patient were reviewed by me and considered in my medical decision making (see chart for details).  Allison Cooke is a 54 y.o. female with hypertension diabetes who presents for evaluation of gradual onset scalp itching and rash ongoing for 2 days, constant since onset, moderate to severe, no modifying factors. On exam, she does have a fine papular rash associated with the scalp, there are some erythematous areas but there is no evidence of bacterial superinfection. Suspect  irritant contact dermatitis. We'll discharge with instructions to use Benadryl as needed according to package instructions for itching as well as hydrocortisone. Discussed return precautions, need for close PCP follow-up and she is comfortable with the discharge plan. ____________________________________________   FINAL CLINICAL IMPRESSION(S) / ED DIAGNOSES  Final diagnoses:  Irritant contact dermatitis due to chemical      Joanne Gavel, MD 06/19/15 0630

## 2015-06-19 NOTE — ED Notes (Signed)
Pt dyed her hair around 11pm Friday evening.  Pt reports developing a rash with pain/burning sensation yesterday morning that worsened throughout the day.  The patient reports this is not her first reaction to hair dye. Pt put benadryl cream along her hairline and down her neck before coming to the hospital.

## 2015-06-19 NOTE — ED Notes (Signed)
Pt dyed hair on Friday evening, started developing bumpy, painful rash on Saturday morning. Pt has been treating w/ topical benadryl w/o relief. Pt has hx of allergies to commercial chemical hair dyes.

## 2015-06-19 NOTE — Discharge Instructions (Signed)
Take Benadryl according to package instructions for itching. Apply the hydrocortisone cream as prescribed. Return immediately to the emergency department if the rash becomes red, raised, warm to the touch, hard or tender, if you're having fevers, if the rash is spreading or for any other concerns.

## 2015-06-20 ENCOUNTER — Encounter: Payer: Self-pay | Admitting: Urgent Care

## 2015-06-20 ENCOUNTER — Emergency Department
Admission: EM | Admit: 2015-06-20 | Discharge: 2015-06-20 | Disposition: A | Discharge status: Home / Self Care | Payer: Medicaid Other | Attending: Emergency Medicine | Admitting: Emergency Medicine

## 2015-06-20 DIAGNOSIS — I10 Essential (primary) hypertension: Secondary | ICD-10-CM | POA: Insufficient documentation

## 2015-06-20 DIAGNOSIS — T7840XD Allergy, unspecified, subsequent encounter: Secondary | ICD-10-CM | POA: Diagnosis present

## 2015-06-20 DIAGNOSIS — F17210 Nicotine dependence, cigarettes, uncomplicated: Secondary | ICD-10-CM | POA: Diagnosis not present

## 2015-06-20 DIAGNOSIS — R220 Localized swelling, mass and lump, head: Secondary | ICD-10-CM | POA: Insufficient documentation

## 2015-06-20 DIAGNOSIS — L299 Pruritus, unspecified: Secondary | ICD-10-CM | POA: Insufficient documentation

## 2015-06-20 DIAGNOSIS — Z79899 Other long term (current) drug therapy: Secondary | ICD-10-CM | POA: Diagnosis not present

## 2015-06-20 DIAGNOSIS — T656X1D Toxic effect of paints and dyes, not elsewhere classified, accidental (unintentional), subsequent encounter: Secondary | ICD-10-CM | POA: Diagnosis not present

## 2015-06-20 DIAGNOSIS — X58XXXD Exposure to other specified factors, subsequent encounter: Secondary | ICD-10-CM | POA: Insufficient documentation

## 2015-06-20 DIAGNOSIS — T6591XD Toxic effect of unspecified substance, accidental (unintentional), subsequent encounter: Secondary | ICD-10-CM

## 2015-06-20 MED ORDER — DEXAMETHASONE SODIUM PHOSPHATE 10 MG/ML IJ SOLN
10.0000 mg | Freq: Once | INTRAMUSCULAR | Status: AC
  Administered 2015-06-20: 10 mg via INTRAMUSCULAR
  Filled 2015-06-20: qty 1

## 2015-06-20 MED ORDER — DIPHENHYDRAMINE HCL 25 MG PO CAPS: 25.0000 mg | ORAL_CAPSULE | ORAL | Status: DC | PRN

## 2015-06-20 MED ORDER — PREDNISONE 10 MG (21) PO TBPK: ORAL_TABLET | ORAL | Status: DC

## 2015-06-20 NOTE — ED Notes (Addendum)
Patient presents with reaction to hair dye. Patient was seen here on Sunday for the same - advising that the medications are not working. Patient with continued itching and swelling per her report.

## 2015-06-20 NOTE — ED Notes (Signed)
Patient with no complaints at this time. Respirations even and unlabored. Skin warm/dry. Discharge instructions reviewed with patient at this time. Patient given opportunity to voice concerns/ask questions. Patient discharged at this time and left Emergency Department with steady gait.   

## 2015-06-20 NOTE — ED Provider Notes (Signed)
[facility] Emergency Department Provider Note ____________________________________________  Time seen: Approximately 9:33 PM  I have reviewed the triage vital signs and the nursing notes.   HISTORY  Chief Complaint Allergic Reaction   HPI Allison Cooke is a 54 y.o. female who presents to the emergency department for a second evaluation of an allergic reaction to hair dye. She states that she has been using the creams as prescribed at her last visit, but the swelling and itching continues. She states that she began to have swelling in her face yesterday and that has gotten worse today.   Past Medical History  Diagnosis Date  . Hypertension   . Pre-diabetes   . SOB (shortness of breath)   . Heart murmur   . Headache   . Arthritis   . Anemia   . Seizures ([place])     as a child    Patient Active Problem List   Diagnosis Date Noted  . Abnormal EKG 04/22/2015  . Dysphagia 03/30/2015  . Medication monitoring encounter 03/30/2015  . Essential hypertension, benign 03/02/2015  . Bloody discharge from left nipple 03/02/2015  . Depression, major, recurrent ([place]) 03/02/2015  . Shortness of breath 03/02/2015  . Tobacco abuse 03/02/2015  . Chronic dermatitis 03/02/2015  . Mass of left breast 03/02/2015  . Hoarseness of voice 03/02/2015    Past Surgical History  Procedure Laterality Date  . Cholecystectomy    . Cesarean section    . Fine needle aspiration Left     breast cyst, benign, age 56?  Marland Kitchen Breast surgery Left   . Direct laryngoscopy N/A 04/26/2015    Procedure: Microlaryngoscopy with exxcision of vocal cord polyp ;  Surgeon: Beverly Gust, MD;  Location: [facility] ORS;  Service: ENT;  Laterality: N/A;    Current Outpatient Rx  Name  Route  Sig  Dispense  Refill  . amLODipine (NORVASC) 5 MG tablet   Oral   Take 1 tablet (5 mg total) by mouth daily.   30 tablet   11   . diphenhydrAMINE (BENADRYL) 25 mg capsule   Oral   Take 1 capsule (25 mg  total) by mouth every 4 (four) hours as needed.   30 capsule   2   . hydrochlorothiazide (HYDRODIURIL) 25 MG tablet   Oral   Take 1 tablet (25 mg total) by mouth daily.   30 tablet   11   . Hydrocodone-Acetaminophen 5-300 MG TABS   Oral   Take 1-2 tablets by mouth every 4 (four) hours as needed.   30 each   0   . nicotine (EQ NICOTINE) 7 mg/24hr patch   Transdermal   Place 1 patch (7 mg total) onto the skin daily.   28 patch   0   . predniSONE (STERAPRED UNI-PAK 21 TAB) 10 MG (21) TBPK tablet      Take 6 tablets on day 1 Take 5 tablets on day 2 Take 4 tablets on day 3 Take 3 tablets on day 4 Take 2 tablets on day 5 Take 1 tablet on day 6   21 tablet   0   . sertraline (ZOLOFT) 100 MG tablet   Oral   Take 1 tablet (100 mg total) by mouth daily.   30 tablet   3     Allergies Shrimp  Family History  Problem Relation Age of Onset  . Hypertension Mother   . Heart disease Mother   . Hyperlipidemia Mother   . Diabetes Mother   .  Heart disease Father   . Hyperlipidemia Father   . Hypertension Father   . Breast cancer Cousin     Social History Social History  Substance Use Topics  . Smoking status: Current Some Day Smoker -- 0.25 packs/day for 20 years    Types: Cigarettes  . Smokeless tobacco: Never Used  . Alcohol Use: No    Review of Systems   Constitutional: No fever/chills Eyes: No visual changes. Swelling around the eyes, worse on the left. ENT: No congestion or rhinorrhea Cardiovascular: Denies chest pain. Respiratory: Denies shortness of breath. Gastrointestinal: No abdominal pain.  No nausea, no vomiting.  No diarrhea.  No constipation. Genitourinary: Negative for dysuria. Musculoskeletal: Negative for back pain. Skin: Healing and itching of the scalp Neurological: Negative for headaches, focal weakness or numbness.  10-point ROS otherwise negative.  ____________________________________________   PHYSICAL EXAM:  VITAL SIGNS: ED  Triage Vitals  Enc Vitals Group     BP 06/20/15 2017 131/81 mmHg     Pulse Rate 06/20/15 2017 97     Resp 06/20/15 2017 16     Temp 06/20/15 2017 98 F (36.7 C)     Temp Source 06/20/15 2017 Oral     SpO2 06/20/15 2017 95 %     Weight --      Height --      Head Cir --      Peak Flow --      Pain Score 06/20/15 2018 6     Pain Loc --      Pain Edu? --      Excl. in [place]? --     Constitutional: Alert and oriented. Well appearing and in no acute distress. Eyes: Conjunctivae are normal. PERRL. EOMI. Head: Atraumatic. Nose: No congestion/rhinnorhea. Mouth/Throat: Mucous membranes are moist.  Oropharynx non-erythematous. No oral lesions. Neck: No stridor. Cardiovascular: Normal rate, regular rhythm.  Good peripheral circulation. Respiratory: Normal respiratory effort.  No retractions. Lungs CTAB. Gastrointestinal: Soft and nontender. No distention. No abdominal bruits.  Musculoskeletal: No lower extremity tenderness nor edema.  No joint effusions. Neurologic:  Normal speech and language. No gross focal neurologic deficits are appreciated. Speech is normal. No gait instability. Skin:  Scalp is erythematous with scaling and peeling throughout, erythema and swelling around the eyes bilaterally worse on the left; Negative for petechiae.  Psychiatric: Mood and affect are normal. Speech and behavior are normal.  ____________________________________________   LABS (all labs ordered are listed, but only abnormal results are displayed)  Labs Reviewed - No data to display ____________________________________________  EKG   ____________________________________________  RADIOLOGY   ____________________________________________   PROCEDURES  Procedure(s) performed:  ____________________________________________   INITIAL IMPRESSION / ASSESSMENT AND PLAN / ED COURSE  Pertinent labs & imaging results that were available during my care of the patient were reviewed by me and  considered in my medical decision making (see chart for details).  IM Decadron given in the emergency department. She will take Benadryl every 6 hours. She was encouraged to follow up with the primary care provider for symptoms that are not improving over the next 2-3 days. She was advised to return to the emergency department for symptoms that change or worsen if unable to schedule an appointment. ____________________________________________   FINAL CLINICAL IMPRESSION(S) / ED DIAGNOSES  Final diagnoses:  Allergic reaction to chemical substance, subsequent encounter       Victorino Dike, FNP 06/20/15 2142  Daymon Larsen, MD 06/20/15 2155

## 2015-06-24 ENCOUNTER — Emergency Department: Payer: Medicaid Other

## 2015-06-24 ENCOUNTER — Telehealth: Payer: Self-pay | Admitting: Family Medicine

## 2015-06-24 ENCOUNTER — Inpatient Hospital Stay
Admission: EM | Admit: 2015-06-24 | Discharge: 2015-06-26 | DRG: 638 | Disposition: A | Discharge status: Home / Self Care | Payer: Medicaid Other | Attending: Internal Medicine | Admitting: Internal Medicine

## 2015-06-24 ENCOUNTER — Encounter: Payer: Self-pay | Admitting: *Deleted

## 2015-06-24 DIAGNOSIS — Z9049 Acquired absence of other specified parts of digestive tract: Secondary | ICD-10-CM | POA: Diagnosis not present

## 2015-06-24 DIAGNOSIS — S8991XA Unspecified injury of right lower leg, initial encounter: Secondary | ICD-10-CM

## 2015-06-24 DIAGNOSIS — Z9889 Other specified postprocedural states: Secondary | ICD-10-CM | POA: Diagnosis not present

## 2015-06-24 DIAGNOSIS — Z833 Family history of diabetes mellitus: Secondary | ICD-10-CM

## 2015-06-24 DIAGNOSIS — I10 Essential (primary) hypertension: Secondary | ICD-10-CM | POA: Diagnosis present

## 2015-06-24 DIAGNOSIS — E7251 Non-ketotic hyperglycinemia: Secondary | ICD-10-CM | POA: Diagnosis present

## 2015-06-24 DIAGNOSIS — Z79899 Other long term (current) drug therapy: Secondary | ICD-10-CM | POA: Diagnosis not present

## 2015-06-24 DIAGNOSIS — E875 Hyperkalemia: Secondary | ICD-10-CM | POA: Diagnosis present

## 2015-06-24 DIAGNOSIS — R011 Cardiac murmur, unspecified: Secondary | ICD-10-CM | POA: Diagnosis present

## 2015-06-24 DIAGNOSIS — Z8249 Family history of ischemic heart disease and other diseases of the circulatory system: Secondary | ICD-10-CM

## 2015-06-24 DIAGNOSIS — Z803 Family history of malignant neoplasm of breast: Secondary | ICD-10-CM | POA: Diagnosis not present

## 2015-06-24 DIAGNOSIS — M1990 Unspecified osteoarthritis, unspecified site: Secondary | ICD-10-CM | POA: Diagnosis present

## 2015-06-24 DIAGNOSIS — E1165 Type 2 diabetes mellitus with hyperglycemia: Secondary | ICD-10-CM | POA: Diagnosis present

## 2015-06-24 DIAGNOSIS — Z716 Tobacco abuse counseling: Secondary | ICD-10-CM | POA: Diagnosis not present

## 2015-06-24 DIAGNOSIS — R739 Hyperglycemia, unspecified: Secondary | ICD-10-CM

## 2015-06-24 DIAGNOSIS — E871 Hypo-osmolality and hyponatremia: Secondary | ICD-10-CM

## 2015-06-24 DIAGNOSIS — F17200 Nicotine dependence, unspecified, uncomplicated: Secondary | ICD-10-CM | POA: Diagnosis present

## 2015-06-24 DIAGNOSIS — R55 Syncope and collapse: Secondary | ICD-10-CM | POA: Diagnosis not present

## 2015-06-24 DIAGNOSIS — R350 Frequency of micturition: Secondary | ICD-10-CM | POA: Diagnosis present

## 2015-06-24 LAB — BASIC METABOLIC PANEL
ANION GAP: 11 (ref 5–15)
Anion gap: 17 — ABNORMAL HIGH (ref 5–15)
Anion gap: 9 (ref 5–15)
BUN: 43 mg/dL — AB (ref 6–20)
BUN: 34 mg/dL — AB (ref 6–20)
BUN: 23 mg/dL — AB (ref 6–20)
CALCIUM: 8.6 mg/dL — AB (ref 8.9–10.3)
CHLORIDE: 77 mmol/L — AB (ref 101–111)
CHLORIDE: 101 mmol/L (ref 101–111)
CO2: 20 mmol/L — ABNORMAL LOW (ref 22–32)
CO2: 22 mmol/L (ref 22–32)
CO2: 24 mmol/L (ref 22–32)
CREATININE: 0.73 mg/dL (ref 0.44–1.00)
Calcium: 9.3 mg/dL (ref 8.9–10.3)
Calcium: 9.1 mg/dL (ref 8.9–10.3)
Chloride: 92 mmol/L — ABNORMAL LOW (ref 101–111)
Creatinine, Ser: 1.36 mg/dL — ABNORMAL HIGH (ref 0.44–1.00)
Creatinine, Ser: 0.99 mg/dL (ref 0.44–1.00)
GFR calc Af Amer: 50 mL/min — ABNORMAL LOW (ref >60)
GFR calc Af Amer: >60 mL/min (ref >60)
GFR calc non Af Amer: 43 mL/min — ABNORMAL LOW (ref >60)
GFR calc non Af Amer: >60 mL/min (ref >60)
GLUCOSE: 1198 mg/dL — AB (ref 65–99)
GLUCOSE: 248 mg/dL — AB (ref 65–99)
Glucose, Bld: 628 mg/dL (ref 65–99)
POTASSIUM: 5.9 mmol/L — AB (ref 3.5–5.1)
POTASSIUM: 4.3 mmol/L (ref 3.5–5.1)
Potassium: 3.7 mmol/L (ref 3.5–5.1)
SODIUM: 125 mmol/L — AB (ref 135–145)
Sodium: 114 mmol/L — CL (ref 135–145)
Sodium: 134 mmol/L — ABNORMAL LOW (ref 135–145)

## 2015-06-24 LAB — URINALYSIS COMPLETE WITH MICROSCOPIC (ARMC ONLY)
BILIRUBIN URINE: NEGATIVE
Nitrite: NEGATIVE
Protein, ur: NEGATIVE mg/dL
Specific Gravity, Urine: 1.023 (ref 1.005–1.030)
pH: 5 (ref 5.0–8.0)

## 2015-06-24 LAB — GLUCOSE, CAPILLARY
GLUCOSE-CAPILLARY: 245 mg/dL — AB (ref 65–99)
GLUCOSE-CAPILLARY: 136 mg/dL — AB (ref 65–99)
Glucose-Capillary: 126 mg/dL — ABNORMAL HIGH (ref 65–99)
Glucose-Capillary: 156 mg/dL — ABNORMAL HIGH (ref 65–99)
Glucose-Capillary: 164 mg/dL — ABNORMAL HIGH (ref 65–99)
Glucose-Capillary: 174 mg/dL — ABNORMAL HIGH (ref 65–99)
Glucose-Capillary: 216 mg/dL — ABNORMAL HIGH (ref 65–99)
Glucose-Capillary: 297 mg/dL — ABNORMAL HIGH (ref 65–99)
Glucose-Capillary: 304 mg/dL — ABNORMAL HIGH (ref 65–99)
Glucose-Capillary: 381 mg/dL — ABNORMAL HIGH (ref 65–99)
Glucose-Capillary: 568 mg/dL (ref 65–99)

## 2015-06-24 LAB — CBC
HEMATOCRIT: 43 % (ref 35.0–47.0)
Hemoglobin: 13.7 g/dL (ref 12.0–16.0)
MCH: 29.8 pg (ref 26.0–34.0)
MCHC: 31.8 g/dL — AB (ref 32.0–36.0)
MCV: 93.7 fL (ref 80.0–100.0)
Platelets: 318 10*3/uL (ref 150–440)
RBC: 4.59 MIL/uL (ref 3.80–5.20)
RDW: 13.5 % (ref 11.5–14.5)
WBC: 15.5 10*3/uL — ABNORMAL HIGH (ref 3.6–11.0)

## 2015-06-24 LAB — TROPONIN I: Troponin I: 0.06 ng/mL — ABNORMAL HIGH (ref <0.031)

## 2015-06-24 LAB — MRSA PCR SCREENING: MRSA by PCR: NEGATIVE

## 2015-06-24 MED ORDER — INSULIN ASPART 100 UNIT/ML ~~LOC~~ SOLN
0.0000 [IU] | Freq: Every day | SUBCUTANEOUS | Status: DC
  Administered 2015-06-25: 5 [IU] via SUBCUTANEOUS
  Filled 2015-06-24: qty 5

## 2015-06-24 MED ORDER — INSULIN ASPART 100 UNIT/ML ~~LOC~~ SOLN
12.0000 [IU] | Freq: Once | SUBCUTANEOUS | Status: AC
  Administered 2015-06-24: 12 [IU] via SUBCUTANEOUS
  Filled 2015-06-24: qty 12

## 2015-06-24 MED ORDER — SODIUM CHLORIDE 0.9 % IV BOLUS (SEPSIS)
1000.0000 mL | Freq: Once | INTRAVENOUS | Status: AC
  Administered 2015-06-24: 1000 mL via INTRAVENOUS

## 2015-06-24 MED ORDER — SODIUM CHLORIDE 0.9 % IV SOLN
INTRAVENOUS | Status: DC
  Administered 2015-06-24: 5 [IU]/h via INTRAVENOUS
  Filled 2015-06-24: qty 2.5

## 2015-06-24 MED ORDER — SERTRALINE HCL 50 MG PO TABS
100.0000 mg | ORAL_TABLET | Freq: Every day | ORAL | Status: DC
  Administered 2015-06-24 – 2015-06-26 (×3): 100 mg via ORAL
  Filled 2015-06-24 (×2): qty 2
  Filled 2015-06-24: qty 1

## 2015-06-24 MED ORDER — NICOTINE 7 MG/24HR TD PT24
7.0000 mg | MEDICATED_PATCH | Freq: Every day | TRANSDERMAL | Status: DC
  Administered 2015-06-24: 7 mg via TRANSDERMAL
  Filled 2015-06-24 (×3): qty 1

## 2015-06-24 MED ORDER — SODIUM CHLORIDE 0.9 % IV SOLN
1.0000 g | Freq: Once | INTRAVENOUS | Status: AC
  Administered 2015-06-24: 1 g via INTRAVENOUS
  Filled 2015-06-24: qty 10

## 2015-06-24 MED ORDER — FENTANYL CITRATE (PF) 100 MCG/2ML IJ SOLN
50.0000 ug | Freq: Once | INTRAMUSCULAR | Status: AC
  Administered 2015-06-24: 50 ug via INTRAVENOUS
  Filled 2015-06-24: qty 2

## 2015-06-24 MED ORDER — AMLODIPINE BESYLATE 5 MG PO TABS
5.0000 mg | ORAL_TABLET | Freq: Every day | ORAL | Status: DC
  Administered 2015-06-24 – 2015-06-26 (×3): 5 mg via ORAL
  Filled 2015-06-24 (×3): qty 1

## 2015-06-24 MED ORDER — DIPHENHYDRAMINE HCL 25 MG PO CAPS: 25.0000 mg | ORAL_CAPSULE | ORAL | Status: DC | PRN

## 2015-06-24 MED ORDER — HEPARIN SODIUM (PORCINE) 5000 UNIT/ML IJ SOLN
5000.0000 [IU] | Freq: Three times a day (TID) | INTRAMUSCULAR | Status: DC
  Administered 2015-06-24 – 2015-06-26 (×6): 5000 [IU] via SUBCUTANEOUS
  Filled 2015-06-24 (×6): qty 1

## 2015-06-24 MED ORDER — INSULIN REGULAR BOLUS VIA INFUSION
0.0000 [IU] | Freq: Three times a day (TID) | INTRAVENOUS | Status: DC
  Filled 2015-06-24: qty 10

## 2015-06-24 MED ORDER — SODIUM CHLORIDE 0.9 % IV SOLN
INTRAVENOUS | Status: DC
  Administered 2015-06-24 – 2015-06-25 (×3): via INTRAVENOUS

## 2015-06-24 MED ORDER — SODIUM CHLORIDE 0.9 % IV SOLN
INTRAVENOUS | Status: DC
  Administered 2015-06-24: 1.9 [IU]/h via INTRAVENOUS
  Administered 2015-06-24: 3.1 [IU]/h via INTRAVENOUS
  Administered 2015-06-24: 5 [IU]/h via INTRAVENOUS

## 2015-06-24 MED ORDER — FENTANYL CITRATE (PF) 100 MCG/2ML IJ SOLN
50.0000 ug | Freq: Once | INTRAMUSCULAR | Status: AC
Start: 2015-06-24 — End: 2015-06-24
  Administered 2015-06-24: 50 ug via INTRAVENOUS
  Filled 2015-06-24: qty 2

## 2015-06-24 MED ORDER — HYDROCODONE-ACETAMINOPHEN 5-325 MG PO TABS: 1.0000 | ORAL_TABLET | ORAL | Status: DC | PRN

## 2015-06-24 MED ORDER — INSULIN ASPART 100 UNIT/ML ~~LOC~~ SOLN: 0.0000 [IU] | Freq: Three times a day (TID) | SUBCUTANEOUS | Status: DC

## 2015-06-24 MED ORDER — INSULIN GLARGINE 100 UNIT/ML ~~LOC~~ SOLN
15.0000 [IU] | Freq: Every day | SUBCUTANEOUS | Status: DC
  Administered 2015-06-24: 15 [IU] via SUBCUTANEOUS
  Filled 2015-06-24 (×2): qty 0.15

## 2015-06-24 MED ORDER — DEXTROSE 50 % IV SOLN: 25.0000 mL | INTRAVENOUS | Status: DC | PRN

## 2015-06-24 NOTE — ED Notes (Addendum)
General Communication:  1. Dr. Burlene Arnt notified of patient's continued pain.  See orders for pain medication.  2. Called main lab to inquire about chemistry, it has been ordered and collected for almost 2 hours with no results.  Everything done except for glucose.  Sodium is 114, this is a critical value and Dr. Burlene Arnt was notified.  Chemistry to result in 20 minutes.

## 2015-06-24 NOTE — Progress Notes (Signed)
[facility] Progress Note Patient Name: Allison Cooke DOB: February 24, 1961 MRN: [id number]   Date of Service  06/24/2015  HPI/Events of Note  Camera in - no distres, vitals all WNL Chem no gap Has HNK, needs volume, is on insulin   eICU Interventions  No new recs     Intervention Category Evaluation Type: New Patient Evaluation  Raylene Miyamoto. 06/24/2015, 6:29 PM

## 2015-06-24 NOTE — ED Notes (Signed)
cbg 297

## 2015-06-24 NOTE — Telephone Encounter (Signed)
I called, left msg for nurse, I have reviewed her chart, will be praying for her; hope her stay is short and she's home again soon

## 2015-06-24 NOTE — ED Notes (Signed)
Pt reports "blacking out" and falling last night, as a result of the fall pt injured  Right knee

## 2015-06-24 NOTE — H&P (Signed)
[place] [facility] [place] NAME: Allison Cooke    MR#:  [id number]  DATE OF BIRTH:  May 10, 1961  DATE OF ADMISSION:  06/24/2015  PRIMARY CARE PHYSICIAN: Enid Derry, MD   REQUESTING/REFERRING PHYSICIAN: Dr.Mcshane  CHIEF COMPLAINT:   Chief Complaint  Patient presents with  . Fall    HISTORY OF PRESENT ILLNESS: Allison Cooke  is a 54 y.o. female with a known history of hypertension, seizure, depression, borderline diabetes but not on medication. She came to ER 4 days ago with complaint of having severe itching and swelling in her scalp after applying some new hair dye and having reaction to that, she was given Benadryl cream to apply and oral steroid tablets and sent home. Next day she again came to emergency room as her the swelling and itching was getting worse, she was given injection Decadron and then advised to continue the oral Benadryl and steroids and local cream to apply. That problems seems to be getting better and she does not have much issue with that now.  But since last 3-4 days since she started on steroid she is feeling she is to the bathroom more frequently, and she is feeling gradually weak. Last night she woke up to go to the bathroom and she lost her balance almost passed out and fell down on the floor hitting her right knee and having pain over there. The pain continued until morning so she decided to come to emergency room. X-ray of the right knee did not show any fracture, but on further workup of her blood work reported her sugar level to be 1100, hyperkalemia and hyponatremia. He started on insulin drip MG given 2 L IV fluid boluses and then given to hospitalist team for admission. She does not have nausea or vomiting or abdominal pain.  PAST MEDICAL HISTORY:   Past Medical History  Diagnosis Date  . Hypertension   . Pre-diabetes   . SOB (shortness of breath)   . Heart murmur   . Headache   . Arthritis   . Anemia    . Seizures ([place])     as a child    PAST SURGICAL HISTORY:  Past Surgical History  Procedure Laterality Date  . Cholecystectomy    . Cesarean section    . Fine needle aspiration Left     breast cyst, benign, age 42?  Marland Kitchen Breast surgery Left   . Direct laryngoscopy N/A 04/26/2015    Procedure: Microlaryngoscopy with exxcision of vocal cord polyp ;  Surgeon: Beverly Gust, MD;  Location: [facility] ORS;  Service: ENT;  Laterality: N/A;    SOCIAL HISTORY:  Social History  Substance Use Topics  . Smoking status: Current Some Day Smoker -- 0.25 packs/day for 20 years    Types: Cigarettes  . Smokeless tobacco: Never Used  . Alcohol Use: No    FAMILY HISTORY:  Family History  Problem Relation Age of Onset  . Hypertension Mother   . Heart disease Mother   . Hyperlipidemia Mother   . Diabetes Mother   . Heart disease Father   . Hyperlipidemia Father   . Hypertension Father   . Breast cancer Cousin     DRUG ALLERGIES:  Allergies  Allergen Reactions  . Shrimp [Shellfish Allergy]     REVIEW OF SYSTEMS:   CONSTITUTIONAL: No fever, positive for fatigue or weakness.  EYES: No blurred or double vision.  EARS, NOSE, AND THROAT: No tinnitus or ear pain.  RESPIRATORY: No cough, shortness of breath, wheezing or hemoptysis.  CARDIOVASCULAR: No chest pain, orthopnea, edema.  GASTROINTESTINAL: No nausea, vomiting, diarrhea or abdominal pain.  GENITOURINARY: No dysuria, hematuria.  ENDOCRINE: No polyuria, nocturia,  HEMATOLOGY: No anemia, easy bruising or bleeding SKIN: No rash or lesion. MUSCULOSKELETAL: Right knee joint pain or arthritis.   NEUROLOGIC: No tingling, numbness, weakness.  PSYCHIATRY: No anxiety or depression.   MEDICATIONS AT HOME:  Prior to Admission medications   Medication Sig Start Date End Date Taking? Authorizing Provider  amLODipine (NORVASC) 5 MG tablet Take 1 tablet (5 mg total) by mouth daily. 03/30/15   Arnetha Courser, MD  diphenhydrAMINE (BENADRYL) 25 mg  capsule Take 1 capsule (25 mg total) by mouth every 4 (four) hours as needed. 06/20/15 06/19/16  Victorino Dike, FNP  hydrochlorothiazide (HYDRODIURIL) 25 MG tablet Take 1 tablet (25 mg total) by mouth daily. 03/30/15   Arnetha Courser, MD  Hydrocodone-Acetaminophen 5-300 MG TABS Take 1-2 tablets by mouth every 4 (four) hours as needed. 04/26/15   Beverly Gust, MD  nicotine (EQ NICOTINE) 7 mg/24hr patch Place 1 patch (7 mg total) onto the skin daily. 03/02/15   Arnetha Courser, MD  predniSONE (STERAPRED UNI-PAK 21 TAB) 10 MG (21) TBPK tablet Take 6 tablets on day 1 Take 5 tablets on day 2 Take 4 tablets on day 3 Take 3 tablets on day 4 Take 2 tablets on day 5 Take 1 tablet on day 6 06/20/15   Cari B Triplett, FNP  sertraline (ZOLOFT) 100 MG tablet Take 1 tablet (100 mg total) by mouth daily. 03/30/15   Arnetha Courser, MD      PHYSICAL EXAMINATION:   VITAL SIGNS: Blood pressure 121/95, pulse 94, temperature 97.4 F (36.3 C), temperature source Oral, resp. rate 16, height 5\' 2"  (1.575 m), weight 70.308 kg (155 lb), SpO2 100 %.  GENERAL:  54 y.o.-year-old patient lying in the bed with no acute distress.  EYES: Pupils equal, round, reactive to light and accommodation. No scleral icterus. Extraocular muscles intact.  HEENT: Head atraumatic, normocephalic. Oropharynx and nasopharynx clear.  NECK:  Supple, no jugular venous distention. No thyroid enlargement, no tenderness.  LUNGS: Normal breath sounds bilaterally, no wheezing, rales,rhonchi or crepitation. No use of accessory muscles of respiration.  CARDIOVASCULAR: S1, S2 normal. No murmurs, rubs, or gallops.  ABDOMEN: Soft, nontender, nondistended. Bowel sounds present. No organomegaly or mass.  EXTREMITIES: No pedal edema, cyanosis, or clubbing. Right knee pain and tender, soft cast applied by ER physician. NEUROLOGIC: Cranial nerves II through XII are intact. Muscle strength 5/5 in all extremities. Sensation intact. Gait not checked.   PSYCHIATRIC: The patient is alert and oriented x 3.  SKIN: No obvious rash, lesion, or ulcer.   LABORATORY PANEL:   CBC  Recent Labs Lab 06/24/15 0934  WBC 15.5*  HGB 13.7  HCT 43.0  PLT 318  MCV 93.7  MCH 29.8  MCHC 31.8*  RDW 13.5   ------------------------------------------------------------------------------------------------------------------  Chemistries   Recent Labs Lab 06/24/15 0934  NA 114*  K 5.9*  CL 77*  CO2 20*  GLUCOSE 1198*  BUN 43*  CREATININE 1.36*  CALCIUM 9.3   ------------------------------------------------------------------------------------------------------------------ estimated creatinine clearance is 43.4 mL/min (by C-G formula based on Cr of 1.36). ------------------------------------------------------------------------------------------------------------------ No results for input(s): TSH, T4TOTAL, T3FREE, THYROIDAB in the last 72 hours.  Invalid input(s): FREET3   Coagulation profile No results for input(s): INR, PROTIME in the last 168 hours. ------------------------------------------------------------------------------------------------------------------- No results for input(s):  DDIMER in the last 72 hours. -------------------------------------------------------------------------------------------------------------------  Cardiac Enzymes  Recent Labs Lab 06/24/15 0934  TROPONINI 0.06*   ------------------------------------------------------------------------------------------------------------------ Invalid input(s): POCBNP  ---------------------------------------------------------------------------------------------------------------  Urinalysis    Component Value Date/Time   COLORURINE STRAW* 06/24/2015 0934   APPEARANCEUR CLEAR* 06/24/2015 0934   LABSPEC 1.023 06/24/2015 0934   PHURINE 5.0 06/24/2015 0934   GLUCOSEU >500* 06/24/2015 0934   HGBUR 1+* 06/24/2015 [place] 06/24/2015 0934    KETONESUR 1+* 06/24/2015 0934   PROTEINUR NEGATIVE 06/24/2015 0934   NITRITE NEGATIVE 06/24/2015 0934   LEUKOCYTESUR TRACE* 06/24/2015 0934     RADIOLOGY: Dg Chest 2 View  06/24/2015  CLINICAL DATA:  Pt blacked out and fell last night. Htn, sob, heart murmer. Smoker. EXAM: CHEST  2 VIEW COMPARISON:  06/08/2009 FINDINGS: The heart size and mediastinal contours are within normal limits. Both lungs are clear. No pleural effusion or pneumothorax. The visualized skeletal structures are unremarkable. IMPRESSION: No active cardiopulmonary disease. Electronically Signed   By: Lajean Manes M.D.   On: 06/24/2015 11:29   Dg Knee Complete 4 Views Right  06/24/2015  CLINICAL DATA:  Pt fell this morning. Hit the ground hard with her right knee. Pain with movement. States unable to apply pressure/stand up on it. EXAM: RIGHT KNEE - COMPLETE 4+ VIEW COMPARISON:  None. FINDINGS: No convincing acute fracture. There is a well corticated bone fragment along the medial margin of the medial femoral condyle. This has a chronic appearance with no associated soft tissue swelling or joint effusion. It may reflect an accessory ossification center. Knee joint is normally spaced and aligned. No joint effusion. No arthropathic change. IMPRESSION: No acute fracture or dislocation.  No joint effusion. Electronically Signed   By: Lajean Manes M.D.   On: 06/24/2015 10:42    EKG: Normal sinus rhythm  IMPRESSION AND PLAN:  * Nonketotic hyperglycemia  Her anion gap is not high and CO2 level is also not very low.  Hemodynamically patient is stable.  Blood sugar is 1100.  Started on insulin IV drip will continue that and monitor in ICU.  She did not had diagnoses of diabetes, but said that her primary care told she is a borderline diabetic.  He was not on any medication and this hyperglycemia resulted due to use of steroid in last few days.  So she may not need any antidiabetic medications once her blood sugar level is under  control.  Insulin drip can be stopped once her sugar level is under control and we can monitor for a few more hours before transferring to the floor.  IV fluids 2 L is given by ER as bolus and I'll continue with 150 ml/hr.  * Hyponatremia  Likely this is pseudohyponatremia  Continue treating hyperglycemia.  * Hyperkalemia  No EKG changes, this can do secondary to very high blood sugar level.  She will be on insulin drip, monitor BMP after 4 hour to check for correction.  We'll give him an injection calcium gluconate for now.  * Hypertension  Continue amlodipine  * Allergy reaction to scalp  Resolved now continue Benadryl oral as needed.  * Smoking  Counseled to quit smoking for 4 minutes and offered nicotine patch she said she would be fine without that and definitely consider about quitting her habit.  All the records are reviewed and case discussed with ED provider. Management plans discussed with the patient, family and they are in agreement.  CODE STATUS: Full code  Condition is  critical because of his severe high blood sugar and hyperkalemia- monitor in ICU until it comes under control.  TOTAL CRITICAL CARE TIME TAKING CARE OF THIS PATIENT: 50 minutes.    Vaughan Basta M.D on 06/24/2015   Between 7am to 6pm - Pager - [phone]  After 6pm go to [email or web address] - password [email or web address] [facility]  Office  [phone]  CC: Primary care physician; Enid Derry, MD   Note: This dictation was prepared with Dragon dictation along with smaller phrase technology. Any transcriptional errors that result from this process are unintentional.

## 2015-06-24 NOTE — ED Notes (Signed)
  Critical glucose is 628.  MD aware.

## 2015-06-24 NOTE — ED Provider Notes (Addendum)
[facility] Emergency Department Provider Note  ____________________________________________   I have reviewed the triage vital signs and the nursing notes.   HISTORY  Chief Complaint Fall    HPI Allison Cooke is a 54 y.o. female who has a history of "prediabetes" was placed on steroids a few days ago for a allergic reaction. Since that time she has had increasing urinary frequency and last night she got up to go to the bathroom and felt lightheaded after standing up from the toilet. She thinks she may have passed out. This happened last night. She landed on her right knee. She has been able to bear weight on it. She denies any closed head injury headache vomiting. She feels dehydrated from frequent urination.  Past Medical History  Diagnosis Date  . Hypertension   . Pre-diabetes   . SOB (shortness of breath)   . Heart murmur   . Headache   . Arthritis   . Anemia   . Seizures ([employer])     as a child    Patient Active Problem List   Diagnosis Date Noted  . Abnormal EKG 04/22/2015  . Dysphagia 03/30/2015  . Medication monitoring encounter 03/30/2015  . Essential hypertension, benign 03/02/2015  . Bloody discharge from left nipple 03/02/2015  . Depression, major, recurrent ([place]) 03/02/2015  . Shortness of breath 03/02/2015  . Tobacco abuse 03/02/2015  . Chronic dermatitis 03/02/2015  . Mass of left breast 03/02/2015  . Hoarseness of voice 03/02/2015    Past Surgical History  Procedure Laterality Date  . Cholecystectomy    . Cesarean section    . Fine needle aspiration Left     breast cyst, benign, age 82?  Marland Kitchen Breast surgery Left   . Direct laryngoscopy N/A 04/26/2015    Procedure: Microlaryngoscopy with exxcision of vocal cord polyp ;  Surgeon: Beverly Gust, MD;  Location: [facility] ORS;  Service: ENT;  Laterality: N/A;    Current Outpatient Rx  Name  Route  Sig  Dispense  Refill  . amLODipine (NORVASC) 5 MG tablet   Oral   Take 1 tablet (5  mg total) by mouth daily.   30 tablet   11   . diphenhydrAMINE (BENADRYL) 25 mg capsule   Oral   Take 1 capsule (25 mg total) by mouth every 4 (four) hours as needed.   30 capsule   2   . hydrochlorothiazide (HYDRODIURIL) 25 MG tablet   Oral   Take 1 tablet (25 mg total) by mouth daily.   30 tablet   11   . Hydrocodone-Acetaminophen 5-300 MG TABS   Oral   Take 1-2 tablets by mouth every 4 (four) hours as needed.   30 each   0   . nicotine (EQ NICOTINE) 7 mg/24hr patch   Transdermal   Place 1 patch (7 mg total) onto the skin daily.   28 patch   0   . predniSONE (STERAPRED UNI-PAK 21 TAB) 10 MG (21) TBPK tablet      Take 6 tablets on day 1 Take 5 tablets on day 2 Take 4 tablets on day 3 Take 3 tablets on day 4 Take 2 tablets on day 5 Take 1 tablet on day 6   21 tablet   0   . sertraline (ZOLOFT) 100 MG tablet   Oral   Take 1 tablet (100 mg total) by mouth daily.   30 tablet   3     Allergies Shrimp  Family History  Problem Relation Age of Onset  . Hypertension Mother   . Heart disease Mother   . Hyperlipidemia Mother   . Diabetes Mother   . Heart disease Father   . Hyperlipidemia Father   . Hypertension Father   . Breast cancer Cousin     Social History Social History  Substance Use Topics  . Smoking status: Current Some Day Smoker -- 0.25 packs/day for 20 years    Types: Cigarettes  . Smokeless tobacco: Never Used  . Alcohol Use: No    Review of Systems Constitutional: No fever/chills Eyes: No visual changes. ENT: No sore throat. No stiff neck no neck pain Cardiovascular: Denies chest pain. Respiratory: Denies shortness of breath. Gastrointestinal:   no vomiting.  No diarrhea.  No constipation. Genitourinary: Negative for dysuria. Musculoskeletal: Negative lower extremity swelling Skin: Negative for rash. Neurological: Negative for headaches, focal weakness or numbness. 10-point ROS otherwise  negative.  ____________________________________________   PHYSICAL EXAM:  VITAL SIGNS: ED Triage Vitals  Enc Vitals Group     BP 06/24/15 0858 156/75 mmHg     Pulse Rate 06/24/15 0858 96     Resp 06/24/15 0858 18     Temp 06/24/15 0858 97.4 F (36.3 C)     Temp Source 06/24/15 0858 Oral     SpO2 06/24/15 0858 96 %     Weight 06/24/15 0858 155 lb (70.308 kg)     Height 06/24/15 0858 5\' 2"  (1.575 m)     Head Cir --      Peak Flow --      Pain Score 06/24/15 0859 9     Pain Loc --      Pain Edu? --      Excl. in [place]? --     Constitutional: Alert and oriented. Well appearing and in no acute distress. Eyes: Conjunctivae are normal. PERRL. EOMI. Head: Atraumatic. Nose: No congestion/rhinnorhea. Mouth/Throat: Mucous membranes are moist.  Oropharynx non-erythematous. Neck: No stridor.   Nontender with no meningismus Cardiovascular: Normal rate, regular rhythm. Grossly normal heart sounds.  Good peripheral circulation. Respiratory: Normal respiratory effort.  No retractions. Lungs CTAB. Abdominal: Soft and nontender. No distention. No guarding no rebound Back:  There is no focal tenderness or step off there is no midline tenderness there are no lesions noted. there is no CVA tenderness Musculoskeletal:  There is pain and mild swelling to the right knee, there is no tenderness to the right hip with full painless motion of the hip there is no tenderness to the ankle compartment are soft, the foot is normal appearance, no DVT signs strong distal pulses no edema Neurologic:  Normal speech and language. No gross focal neurologic deficits are appreciated.  Skin:  Skin is warm, dry and intact. No rash noted. Psychiatric: Mood and affect are normal. Speech and behavior are normal.  ____________________________________________   LABS (all labs ordered are listed, but only abnormal results are displayed)  Labs Reviewed  CBC - Abnormal; Notable for the following:    WBC 15.5 (*)     MCHC 31.8 (*)    All other components within normal limits  URINALYSIS COMPLETEWITH MICROSCOPIC ([facility] ONLY) - Abnormal; Notable for the following:    Color, Urine STRAW (*)    APPearance CLEAR (*)    Glucose, UA >500 (*)    Ketones, ur 1+ (*)    Hgb urine dipstick 1+ (*)    Leukocytes, UA TRACE (*)    Bacteria, UA MANY (*)    Squamous  Epithelial / LPF 0-5 (*)    All other components within normal limits  GLUCOSE, CAPILLARY - Abnormal; Notable for the following:    Glucose-Capillary >600 (*)    All other components within normal limits  BASIC METABOLIC PANEL  TROPONIN I  CBG MONITORING, ED   ____________________________________________  EKG  I personally interpreted any EKGs ordered by me or triage Normal sinus rhythm with no acute ischemic changes noted. ____________________________________________  [id number]  I reviewed any imaging ordered by me or triage that were performed during my shift ____________________________________________   PROCEDURES  Procedure(s) performed: None  Critical Care performed: CRITICAL CARE Performed by: Schuyler Amor   Total critical care time: 40 minutes  Critical care time was exclusive of separately billable procedures and treating other patients.  Critical care was necessary to treat or prevent imminent or life-threatening deterioration.  Critical care was time spent personally by me on the following activities: development of treatment plan with patient and/or surrogate as well as nursing, discussions with consultants, evaluation of patient's response to treatment, examination of patient, obtaining history from patient or surrogate, ordering and performing treatments and interventions, ordering and review of laboratory studies, ordering and review of radiographic studies, pulse oximetry and re-evaluation of patient's condition.    ____________________________________________   INITIAL IMPRESSION / ASSESSMENT AND PLAN / ED  COURSE  Pertinent labs & imaging results that were available during my care of the patient were reviewed by me and considered in my medical decision making (see chart for details).  Patient with markedly high glucose, lab results are still pending as for 2 hours we have called several times. The patient does have a contusion to the knee but there is no evidence of acute fracture on x-ray. No evidence of hip fracture or ankle pain at this time. My concern is for the patient's elevated glucose which was likely precipitated by the steroid use. We have given her 2 L of fluid, 12 units of insulin and we are watching her carefully. Because of the syncope which I believe is mediated by intravascular depletion secondary to hyperglycemia, we will admit the patient for further observation and glucose control as she is essentially new onset diabetes. ____________________________________________   FINAL CLINICAL IMPRESSION(S) / ED DIAGNOSES  Final diagnoses:  None     Schuyler Amor, MD 06/24/15 [place], MD 06/24/15 1226

## 2015-06-25 LAB — CBC
HEMATOCRIT: 34.4 % — AB (ref 35.0–47.0)
HEMOGLOBIN: 11.7 g/dL — AB (ref 12.0–16.0)
MCH: 30.2 pg (ref 26.0–34.0)
MCHC: 33.9 g/dL (ref 32.0–36.0)
MCV: 89.1 fL (ref 80.0–100.0)
Platelets: 261 10*3/uL (ref 150–440)
RBC: 3.86 MIL/uL (ref 3.80–5.20)
RDW: 13.4 % (ref 11.5–14.5)
WBC: 13.1 10*3/uL — ABNORMAL HIGH (ref 3.6–11.0)

## 2015-06-25 LAB — GLUCOSE, CAPILLARY
GLUCOSE-CAPILLARY: 332 mg/dL — AB (ref 65–99)
GLUCOSE-CAPILLARY: 365 mg/dL — AB (ref 65–99)
GLUCOSE-CAPILLARY: 377 mg/dL — AB (ref 65–99)
GLUCOSE-CAPILLARY: 431 mg/dL — AB (ref 65–99)
Glucose-Capillary: 409 mg/dL — ABNORMAL HIGH (ref 65–99)
Glucose-Capillary: 302 mg/dL — ABNORMAL HIGH (ref 65–99)
Glucose-Capillary: 421 mg/dL — ABNORMAL HIGH (ref 65–99)

## 2015-06-25 LAB — BASIC METABOLIC PANEL
ANION GAP: 7 (ref 5–15)
BUN: 14 mg/dL (ref 6–20)
CALCIUM: 8.4 mg/dL — AB (ref 8.9–10.3)
CHLORIDE: 103 mmol/L (ref 101–111)
CO2: 20 mmol/L — AB (ref 22–32)
Creatinine, Ser: 0.66 mg/dL (ref 0.44–1.00)
GFR calc non Af Amer: >60 mL/min (ref >60)
GLUCOSE: 346 mg/dL — AB (ref 65–99)
POTASSIUM: 3.9 mmol/L (ref 3.5–5.1)
Sodium: 130 mmol/L — ABNORMAL LOW (ref 135–145)

## 2015-06-25 LAB — HEMOGLOBIN A1C: Hgb A1c MFr Bld: 12.5 % — ABNORMAL HIGH (ref 4.0–6.0)

## 2015-06-25 MED ORDER — GLIPIZIDE ER 2.5 MG PO TB24
2.5000 mg | ORAL_TABLET | Freq: Every day | ORAL | Status: DC
  Administered 2015-06-26: 2.5 mg via ORAL
  Filled 2015-06-25 (×2): qty 1

## 2015-06-25 MED ORDER — METFORMIN HCL 500 MG PO TABS
500.0000 mg | ORAL_TABLET | Freq: Two times a day (BID) | ORAL | Status: DC
  Administered 2015-06-25 – 2015-06-26 (×3): 500 mg via ORAL
  Filled 2015-06-25 (×3): qty 1

## 2015-06-25 MED ORDER — INSULIN ASPART 100 UNIT/ML ~~LOC~~ SOLN
10.0000 [IU] | Freq: Once | SUBCUTANEOUS | Status: AC
  Administered 2015-06-25: 10 [IU] via SUBCUTANEOUS
  Filled 2015-06-25: qty 10

## 2015-06-25 MED ORDER — INSULIN GLARGINE 100 UNIT/ML ~~LOC~~ SOLN
10.0000 [IU] | Freq: Once | SUBCUTANEOUS | Status: AC
  Administered 2015-06-25: 10 [IU] via SUBCUTANEOUS
  Filled 2015-06-25: qty 0.1

## 2015-06-25 MED ORDER — INSULIN GLARGINE 100 UNIT/ML ~~LOC~~ SOLN
20.0000 [IU] | Freq: Every day | SUBCUTANEOUS | Status: DC
  Administered 2015-06-25: 20 [IU] via SUBCUTANEOUS
  Filled 2015-06-25 (×2): qty 0.2

## 2015-06-25 MED ORDER — METFORMIN HCL 500 MG PO TABS: 500.0000 mg | ORAL_TABLET | Freq: Two times a day (BID) | ORAL | Status: DC

## 2015-06-25 MED ORDER — INSULIN ASPART 100 UNIT/ML ~~LOC~~ SOLN
0.0000 [IU] | Freq: Three times a day (TID) | SUBCUTANEOUS | Status: DC
  Administered 2015-06-25 (×2): 7 [IU] via SUBCUTANEOUS
  Administered 2015-06-26 (×2): 5 [IU] via SUBCUTANEOUS
  Filled 2015-06-25 (×2): qty 7
  Filled 2015-06-25 (×2): qty 5

## 2015-06-25 MED ORDER — AMLODIPINE BESYLATE 5 MG PO TABS: 5.0000 mg | ORAL_TABLET | Freq: Every day | ORAL | Status: DC

## 2015-06-25 MED ORDER — GLIPIZIDE ER 2.5 MG PO TB24
2.5000 mg | ORAL_TABLET | Freq: Once | ORAL | Status: AC
  Administered 2015-06-25: 2.5 mg via ORAL
  Filled 2015-06-25: qty 1

## 2015-06-25 NOTE — Progress Notes (Signed)
Pts. fsbs is 421. Stat glucose ordered and Dr. Lavetta Nielsen ordered 5 units of regular insulin to be given.

## 2015-06-25 NOTE — Progress Notes (Signed)
While patient was waiting for ride to arrive, blood glucose was rechecked per NA. MD notified BS >400, new orders carried out. Dr Anselm Jungling to see patient before discharge.

## 2015-06-25 NOTE — Progress Notes (Signed)
Patient is alert and oriented. Up to [facility] to void and had BM today. Had reviewed discharge instructions and was prepared to discharge patient per MD order when follow up blood glucose was elevated. MD notified and orders carried out. MD came to bedside to talk with patient and discuss plan of care. Orders for subq and PO medications given for glycemic control. MD order to transfer to floor. Called report to [place]. Per Janie's request, called MD to ensure he was aware of blood glucose and plan to transfer. MD acknowledged and ok with transfer. Will transfer shortly.

## 2015-06-25 NOTE — Plan of Care (Signed)
Problem: Activity: Goal: Ability to perform activities of daily living will improve Outcome: Progressing Pt assisted oob to [facility], knee immobilizer in place.  Problem: Education: Goal: Knowledge of disease or condition will improve Outcome: Progressing Pt states that DM is common in her family, diet reviewed. Goal: Knowledge of the prescribed therapeutic regimen will improve Outcome: Progressing Dicussed insulin plan for pt overnight, Lantus. SSI ACHS.  Problem: Fluid Volume: Goal: Ability to achieve a balanced intake and output will improve Outcome: Progressing IVF cont @ 156ml/hour. Goal: Will maintain adequate fluid volume Outcome: Progressing Pt placed on PO diet, enc intake.  Problem: Metabolic: Goal: Ability to maintain appropriate glucose levels will improve Outcome: Progressing BG 100s while NPO, BG 300 post meal. Lantus given per MD order prior to insulin gtt d/c'd. Goal: Diagnostic test results will improve Outcome: Progressing MD added HgbA1c to labs.  Problem: Nutritional: Goal: Maintenance of adequate nutrition will improve Outcome: Progressing Pt started on Carb restricted and Heart healthy diet.

## 2015-06-25 NOTE — Progress Notes (Signed)
Patient is alert and oriented. Reporting no pain. Tolerating diet. Patient to discharge home per MD order. Discharge instructions reviewed. Prescriptions given. Patient awaiting ride to arrive. Will escort out when transportation arrives.

## 2015-06-25 NOTE — Discharge Summary (Addendum)
[facility] [place] NAME: Allison Cooke    MR#:  [id number]  DATE OF BIRTH:  08-12-1960  DATE OF ADMISSION:  06/24/2015 ADMITTING PHYSICIAN: Vaughan Basta, MD  DATE OF DISCHARGE: 06/26/2015  PRIMARY CARE PHYSICIAN: Enid Derry, MD    ADMISSION DIAGNOSIS:  Syncope and collapse [R55] Hyperglycemia [R73.9] Knee injury, right, initial encounter [S89.91XA]  DISCHARGE DIAGNOSIS:  Principal Problem:   Hyperglycemia Active Problems:   Hyperkalemia   Hyponatremia   Nonketotic hyperglycinemia ([place])   Diabetes Mellitus  SECONDARY DIAGNOSIS:   Past Medical History  Diagnosis Date  . Hypertension   . Pre-diabetes   . SOB (shortness of breath)   . Heart murmur   . Headache   . Arthritis   . Anemia   . Seizures ([place])     as a child    HOSPITAL COURSE:   * Nonketotic hyperglycemia Her anion gap is not high and CO2 level is also not very low. Hemodynamically patient is stable. Blood sugar is 1100. Started on insulin IV drip and monitor in ICU. She did not had diagnoses of diabetes, but said that her primary care told she is a borderline diabetic. She was not on any medication and this hyperglycemia resulted due to use of steroid in last few days. Insulin drip stopped once her sugar level is under control    IV fluids 2 L is given by ER as bolus and continue with 150 ml/hr.   After stopping Insulin drip- her Blood sugar stayed in 200-300 range.   I explained- she have DM, and will start on metformin.    She need to follow with PMD in office- in 1 week- to help adjust her medication dose.   Later in the day before discharge on 06/25/15- her Blood sugar rise again > 400- so cancelled discharge.  Added glipizide and lantus.    Next day her blood sugar in 200 range.    So advised to start levemir for now- and teach about daily 2-3 times blood sugar check.   Regular follow up and diet control re-enforced.     Discharge on 06/26/15.  * Hyponatremia Likely this is pseudohyponatremia Continue treating hyperglycemia.  Improved.  * Hyperkalemia No EKG changes, this can do secondary to very high blood sugar level.   Improved with IV Insulin drip.  * Hypertension Continue amlodipine.  * Allergy reaction to scalp Resolved now continue Benadryl oral as needed.  * Smoking Counseled to quit smoking for 4 minutes and offered nicotine patch she said she would be fine without that and definitely consider about quitting her habit.   DISCHARGE CONDITIONS:   Stable.  CONSULTS OBTAINED:     DRUG ALLERGIES:   Allergies  Allergen Reactions  . Shrimp [Shellfish Allergy]     Pt stated she is able to eat shrimp now!    DISCHARGE MEDICATIONS:   Current Discharge Medication List    START taking these medications   Details  blood glucose meter kit and supplies KIT Dispense based on patient and insurance preference. Use up to four times daily as directed. (FOR ICD-9 250.00, 250.01). Qty: 1 each, Refills: 0    glipiZIDE (GLUCOTROL XL) 2.5 MG 24 hr tablet Take 1 tablet (2.5 mg total) by mouth daily with breakfast. Qty: 30 tablet, Refills: 0    insulin detemir (LEVEMIR) 100 UNIT/ML injection Inject 0.2 mLs (20 Units total) into the skin at bedtime. Qty: 10 mL, Refills: 1  metFORMIN (GLUCOPHAGE) 500 MG tablet Take 1 tablet (500 mg total) by mouth 2 (two) times daily with a meal. Qty: 60 tablet, Refills: 0    Syringe, Disposable, 1 ML MISC 1 Syringe by Does not apply route at bedtime. To use to take levemir injection. Qty: 100 each, Refills: 0      CONTINUE these medications which have CHANGED   Details  amLODipine (NORVASC) 5 MG tablet Take 1 tablet (5 mg total) by mouth daily. Qty: 30 tablet, Refills: 0      CONTINUE these medications which have NOT CHANGED   Details  sertraline (ZOLOFT) 100 MG tablet Take 1 tablet (100 mg total) by mouth daily. Qty: 30 tablet, Refills: 3       STOP taking these medications     diphenhydrAMINE (BENADRYL) 25 mg capsule      hydrochlorothiazide (HYDRODIURIL) 25 MG tablet      predniSONE (STERAPRED UNI-PAK 21 TAB) 10 MG (21) TBPK tablet      Hydrocodone-Acetaminophen 5-300 MG TABS      nicotine (EQ NICOTINE) 7 mg/24hr patch          DISCHARGE INSTRUCTIONS:    Follow with PMD.  Diet control.  If you experience worsening of your admission symptoms, develop shortness of breath, life threatening emergency, suicidal or homicidal thoughts you must seek medical attention immediately by calling 911 or calling your MD immediately  if symptoms less severe.  You Must read complete instructions/literature along with all the possible adverse reactions/side effects for all the Medicines you take and that have been prescribed to you. Take any new Medicines after you have completely understood and accept all the possible adverse reactions/side effects.   Please note  You were cared for by a hospitalist during your hospital stay. If you have any questions about your discharge medications or the care you received while you were in the hospital after you are discharged, you can call the unit and asked to speak with the hospitalist on call if the hospitalist that took care of you is not available. Once you are discharged, your primary care physician will handle any further medical issues. Please note that NO REFILLS for any discharge medications will be authorized once you are discharged, as it is imperative that you return to your primary care physician (or establish a relationship with a primary care physician if you do not have one) for your aftercare needs so that they can reassess your need for medications and monitor your lab values.    Today   CHIEF COMPLAINT:   Chief Complaint  Patient presents with  . Fall    HISTORY OF PRESENT ILLNESS:  Allison Cooke  is a 54 y.o. female with a known history of hypertension, seizure,  depression, borderline diabetes but not on medication. She came to ER 4 days ago with complaint of having severe itching and swelling in her scalp after applying some new hair dye and having reaction to that, she was given Benadryl cream to apply and oral steroid tablets and sent home. Next day she again came to emergency room as her the swelling and itching was getting worse, she was given injection Decadron and then advised to continue the oral Benadryl and steroids and local cream to apply. That problems seems to be getting better and she does not have much issue with that now. But since last 3-4 days since she started on steroid she is feeling she is to the bathroom more frequently, and she is feeling  gradually weak. Last night she woke up to go to the bathroom and she lost her balance almost passed out and fell down on the floor hitting her right knee and having pain over there. The pain continued until morning so she decided to come to emergency room. X-ray of the right knee did not show any fracture, but on further workup of her blood work reported her sugar level to be 1100, hyperkalemia and hyponatremia. He started on insulin drip MG given 2 L IV fluid boluses and then given to hospitalist team for admission. She does not have nausea or vomiting or abdominal pain.   VITAL SIGNS:  Blood pressure 142/77, pulse 65, temperature 98 F (36.7 C), temperature source Oral, resp. rate 16, height 5' 2"  (1.575 m), weight 71.9 kg (158 lb 8.2 oz), SpO2 100 %.  I/O:    Intake/Output Summary (Last 24 hours) at 06/26/15 1043 Last data filed at 06/26/15 0900  Gross per 24 hour  Intake    360 ml  Output      0 ml  Net    360 ml    PHYSICAL EXAMINATION:   GENERAL: 54 y.o.-year-old patient lying in the bed with no acute distress.  EYES: Pupils equal, round, reactive to light and accommodation. No scleral icterus. Extraocular muscles intact.  HEENT: Head atraumatic, normocephalic. Oropharynx and  nasopharynx clear.  NECK: Supple, no jugular venous distention. No thyroid enlargement, no tenderness.  LUNGS: Normal breath sounds bilaterally, no wheezing, rales,rhonchi or crepitation. No use of accessory muscles of respiration.  CARDIOVASCULAR: S1, S2 normal. No murmurs, rubs, or gallops.  ABDOMEN: Soft, nontender, nondistended. Bowel sounds present. No organomegaly or mass.  EXTREMITIES: No pedal edema, cyanosis, or clubbing. Right knee pain and tender, soft cast applied by ER physician. NEUROLOGIC: Cranial nerves II through XII are intact. Muscle strength 5/5 in all extremities. Sensation intact. Gait not checked.  PSYCHIATRIC: The patient is alert and oriented x 3.  SKIN: No obvious rash, lesion, or ulcer.   DATA REVIEW:   CBC  Recent Labs Lab 06/25/15 0742  WBC 13.1*  HGB 11.7*  HCT 34.4*  PLT 261    Chemistries   Recent Labs Lab 06/25/15 0742 06/25/15 2338  NA 130*  --   K 3.9  --   CL 103  --   CO2 20*  --   GLUCOSE 346* 344*  BUN 14  --   CREATININE 0.66  --   CALCIUM 8.4*  --     Cardiac Enzymes  Recent Labs Lab 06/24/15 0934  TROPONINI 0.06*    Microbiology Results  Results for orders placed or performed during the hospital encounter of 06/24/15  MRSA PCR Screening     Status: None   Collection Time: 06/24/15  5:32 PM  Result Value Ref Range Status   MRSA by PCR NEGATIVE NEGATIVE Final    Comment:        The GeneXpert MRSA Assay (FDA approved for NASAL specimens only), is one component of a comprehensive MRSA colonization surveillance program. It is not intended to diagnose MRSA infection nor to guide or monitor treatment for MRSA infections.     RADIOLOGY:  Dg Chest 2 View  06/24/2015  CLINICAL DATA:  Pt blacked out and fell last night. Htn, sob, heart murmer. Smoker. EXAM: CHEST  2 VIEW COMPARISON:  06/08/2009 FINDINGS: The heart size and mediastinal contours are within normal limits. Both lungs are clear. No pleural effusion  or pneumothorax. The visualized skeletal structures are  unremarkable. IMPRESSION: No active cardiopulmonary disease. Electronically Signed   By: Lajean Manes M.D.   On: 06/24/2015 11:29    EKG:   Orders placed or performed during the hospital encounter of 06/24/15  . EKG 12-Lead  . EKG 12-Lead  . ED EKG  . ED EKG      Management plans discussed with the patient, family and they are in agreement.  CODE STATUS:     Code Status Orders        Start     Ordered   06/24/15 1522  Full code   Continuous     06/24/15 1521      TOTAL TIME TAKING CARE OF THIS PATIENT: 35 minutes.    Vaughan Basta M.D on 06/26/2015 at 10:43 AM  Between 7am to 6pm - Pager - [phone]  After 6pm go to [email or web address] - password [email or web address] [place] [facility]  Office  [phone]  CC: Primary care physician; Enid Derry, MD   Note: This dictation was prepared with Dragon dictation along with smaller phrase technology. Any transcriptional errors that result from this process are unintentional.

## 2015-06-25 NOTE — Discharge Instructions (Addendum)
No sugary drinks. Eat less oily, cheesy food. Less chocolates, sugar, icecream. More vegetables and fruits in diet.  Follow with PMD regularly in office. Blood Glucose Monitoring, Adult Monitoring your blood glucose (also know as blood sugar) helps you to manage your diabetes. It also helps you and your health care provider monitor your diabetes and determine how well your treatment plan is working. WHY SHOULD YOU MONITOR YOUR BLOOD GLUCOSE?  It can help you understand how food, exercise, and medicine affect your blood glucose.  It allows you to know what your blood glucose is at any given moment. You can quickly tell if you are having low blood glucose (hypoglycemia) or high blood glucose (hyperglycemia).  It can help you and your health care provider know how to adjust your medicines.  It can help you understand how to manage an illness or adjust medicine for exercise. WHEN SHOULD YOU TEST? Your health care provider will help you decide how often you should check your blood glucose. This may depend on the type of diabetes you have, your diabetes control, or the types of medicines you are taking. Be sure to write down all of your blood glucose readings so that this information can be reviewed with your health care provider. See below for examples of testing times that your health care provider may suggest. Type 1 Diabetes  Test at least 2 times per day if your diabetes is well controlled, if you are using an insulin pump, or if you perform multiple daily injections.  If your diabetes is not well controlled or if you are sick, you may need to test more often.  It is a good idea to also test:  Before every insulin injection.  Before and after exercise.  Between meals and 2 hours after a meal.  Occasionally between 2:00 a.m. and 3:00 a.m. Type 2 Diabetes  If you are taking insulin, test at least 2 times per day. However, it is best to test before every insulin injection.  If you  take medicines by mouth (orally), test 2 times a day.  If you are on a controlled diet, test once a day.  If your diabetes is not well controlled or if you are sick, you may need to monitor more often. HOW TO MONITOR YOUR BLOOD GLUCOSE Supplies Needed  Blood glucose meter.  Test strips for your meter. Each meter has its own strips. You must use the strips that go with your own meter.  A pricking needle (lancet).  A device that holds the lancet (lancing device).  A journal or log book to write down your results. Procedure  Wash your hands with soap and water. Alcohol is not preferred.  Prick the side of your finger (not the tip) with the lancet.  Gently milk the finger until a small drop of blood appears.  Follow the instructions that come with your meter for inserting the test strip, applying blood to the strip, and using your blood glucose meter. Other Areas to Get Blood for Testing Some meters allow you to use other areas of your body (other than your finger) to test your blood. These areas are called alternative sites. The most common alternative sites are:  The forearm.  The thigh.  The back area of the lower leg.  The palm of the hand. The blood flow in these areas is slower. Therefore, the blood glucose values you get may be delayed, and the numbers are different from what you would get from your fingers.  Do not use alternative sites if you think you are having hypoglycemia. Your reading will not be accurate. Always use a finger if you are having hypoglycemia. Also, if you cannot feel your lows (hypoglycemia unawareness), always use your fingers for your blood glucose checks. ADDITIONAL TIPS FOR GLUCOSE MONITORING  Do not reuse lancets.  Always carry your supplies with you.  All blood glucose meters have a 24-hour "hotline" number to call if you have questions or need help.  Adjust (calibrate) your blood glucose meter with a control solution after finishing a few  boxes of strips. BLOOD GLUCOSE RECORD KEEPING It is a good idea to keep a daily record or log of your blood glucose readings. Most glucose meters, if not all, keep your glucose records stored in the meter. Some meters come with the ability to download your records to your home computer. Keeping a record of your blood glucose readings is especially helpful if you are wanting to look for patterns. Make notes to go along with the blood glucose readings because you might forget what happened at that exact time. Keeping good records helps you and your health care provider to work together to achieve good diabetes management.    This information is not intended to replace advice given to you by your health care provider. Make sure you discuss any questions you have with your health care provider.   Document Released: 07/26/2003 Document Revised: 08/13/2014 Document Reviewed: 12/15/2012 Elsevier Interactive Patient Education 2016 [employer]  Blood Glucose Monitoring, Adult Monitoring your blood glucose (also know as blood sugar) helps you to manage your diabetes. It also helps you and your health care provider monitor your diabetes and determine how well your treatment plan is working. WHY SHOULD YOU MONITOR YOUR BLOOD GLUCOSE?  It can help you understand how food, exercise, and medicine affect your blood glucose.  It allows you to know what your blood glucose is at any given moment. You can quickly tell if you are having low blood glucose (hypoglycemia) or high blood glucose (hyperglycemia).  It can help you and your health care provider know how to adjust your medicines.  It can help you understand how to manage an illness or adjust medicine for exercise. WHEN SHOULD YOU TEST? Your health care provider will help you decide how often you should check your blood glucose. This may depend on the type of diabetes you have, your diabetes control, or the types of medicines you are taking. Be sure to  write down all of your blood glucose readings so that this information can be reviewed with your health care provider. See below for examples of testing times that your health care provider may suggest. Type 1 Diabetes  Test at least 2 times per day if your diabetes is well controlled, if you are using an insulin pump, or if you perform multiple daily injections.  If your diabetes is not well controlled or if you are sick, you may need to test more often.  It is a good idea to also test:  Before every insulin injection.  Before and after exercise.  Between meals and 2 hours after a meal.  Occasionally between 2:00 a.m. and 3:00 a.m. Type 2 Diabetes  If you are taking insulin, test at least 2 times per day. However, it is best to test before every insulin injection.  If you take medicines by mouth (orally), test 2 times a day.  If you are on a controlled diet, test once a day.  If your diabetes is not well controlled or if you are sick, you may need to monitor more often. HOW TO MONITOR YOUR BLOOD GLUCOSE Supplies Needed  Blood glucose meter.  Test strips for your meter. Each meter has its own strips. You must use the strips that go with your own meter.  A pricking needle (lancet).  A device that holds the lancet (lancing device).  A journal or log book to write down your results. Procedure  Wash your hands with soap and water. Alcohol is not preferred.  Prick the side of your finger (not the tip) with the lancet.  Gently milk the finger until a small drop of blood appears.  Follow the instructions that come with your meter for inserting the test strip, applying blood to the strip, and using your blood glucose meter. Other Areas to Get Blood for Testing Some meters allow you to use other areas of your body (other than your finger) to test your blood. These areas are called alternative sites. The most common alternative sites are:  The forearm.  The thigh.  The back  area of the lower leg.  The palm of the hand. The blood flow in these areas is slower. Therefore, the blood glucose values you get may be delayed, and the numbers are different from what you would get from your fingers. Do not use alternative sites if you think you are having hypoglycemia. Your reading will not be accurate. Always use a finger if you are having hypoglycemia. Also, if you cannot feel your lows (hypoglycemia unawareness), always use your fingers for your blood glucose checks. ADDITIONAL TIPS FOR GLUCOSE MONITORING  Do not reuse lancets.  Always carry your supplies with you.  All blood glucose meters have a 24-hour "hotline" number to call if you have questions or need help.  Adjust (calibrate) your blood glucose meter with a control solution after finishing a few boxes of strips. BLOOD GLUCOSE RECORD KEEPING It is a good idea to keep a daily record or log of your blood glucose readings. Most glucose meters, if not all, keep your glucose records stored in the meter. Some meters come with the ability to download your records to your home computer. Keeping a record of your blood glucose readings is especially helpful if you are wanting to look for patterns. Make notes to go along with the blood glucose readings because you might forget what happened at that exact time. Keeping good records helps you and your health care provider to work together to achieve good diabetes management.    This information is not intended to replace advice given to you by your health care provider. Make sure you discuss any questions you have with your health care provider.   Document Released: 07/26/2003 Document Revised: 08/13/2014 Document Reviewed: 12/15/2012 Elsevier Interactive Patient Education 2016 [employer] Inc[employer]  Blood Glucose Monitoring, Adult Monitoring your blood glucose (also know as blood sugar) helps you to manage your diabetes. It also helps you and your health care provider monitor your  diabetes and determine how well your treatment plan is working. WHY SHOULD YOU MONITOR YOUR BLOOD GLUCOSE?  It can help you understand how food, exercise, and medicine affect your blood glucose.  It allows you to know what your blood glucose is at any given moment. You can quickly tell if you are having low blood glucose (hypoglycemia) or high blood glucose (hyperglycemia).  It can help you and your health care provider know how to adjust your medicines.  It can help you understand how to manage an illness or adjust medicine for exercise. WHEN SHOULD YOU TEST? Your health care provider will help you decide how often you should check your blood glucose. This may depend on the type of diabetes you have, your diabetes control, or the types of medicines you are taking. Be sure to write down all of your blood glucose readings so that this information can be reviewed with your health care provider. See below for examples of testing times that your health care provider may suggest. Type 1 Diabetes  Test at least 2 times per day if your diabetes is well controlled, if you are using an insulin pump, or if you perform multiple daily injections.  If your diabetes is not well controlled or if you are sick, you may need to test more often.  It is a good idea to also test:  Before every insulin injection.  Before and after exercise.  Between meals and 2 hours after a meal.  Occasionally between 2:00 a.m. and 3:00 a.m. Type 2 Diabetes  If you are taking insulin, test at least 2 times per day. However, it is best to test before every insulin injection.  If you take medicines by mouth (orally), test 2 times a day.  If you are on a controlled diet, test once a day.  If your diabetes is not well controlled or if you are sick, you may need to monitor more often. HOW TO MONITOR YOUR BLOOD GLUCOSE Supplies Needed  Blood glucose meter.  Test strips for your meter. Each meter has its own strips. You  must use the strips that go with your own meter.  A pricking needle (lancet).  A device that holds the lancet (lancing device).  A journal or log book to write down your results. Procedure  Wash your hands with soap and water. Alcohol is not preferred.  Prick the side of your finger (not the tip) with the lancet.  Gently milk the finger until a small drop of blood appears.  Follow the instructions that come with your meter for inserting the test strip, applying blood to the strip, and using your blood glucose meter. Other Areas to Get Blood for Testing Some meters allow you to use other areas of your body (other than your finger) to test your blood. These areas are called alternative sites. The most common alternative sites are:  The forearm.  The thigh.  The back area of the lower leg.  The palm of the hand. The blood flow in these areas is slower. Therefore, the blood glucose values you get may be delayed, and the numbers are different from what you would get from your fingers. Do not use alternative sites if you think you are having hypoglycemia. Your reading will not be accurate. Always use a finger if you are having hypoglycemia. Also, if you cannot feel your lows (hypoglycemia unawareness), always use your fingers for your blood glucose checks. ADDITIONAL TIPS FOR GLUCOSE MONITORING  Do not reuse lancets.  Always carry your supplies with you.  All blood glucose meters have a 24-hour "hotline" number to call if you have questions or need help.  Adjust (calibrate) your blood glucose meter with a control solution after finishing a few boxes of strips. BLOOD GLUCOSE RECORD KEEPING It is a good idea to keep a daily record or log of your blood glucose readings. Most glucose meters, if not all, keep your glucose records stored in the meter. Some meters  come with the ability to download your records to your home computer. Keeping a record of your blood glucose readings is  especially helpful if you are wanting to look for patterns. Make notes to go along with the blood glucose readings because you might forget what happened at that exact time. Keeping good records helps you and your health care provider to work together to achieve good diabetes management.    This information is not intended to replace advice given to you by your health care provider. Make sure you discuss any questions you have with your health care provider.   Document Released: 07/26/2003 Document Revised: 08/13/2014 Document Reviewed: 12/15/2012 Elsevier Interactive Patient Education 2016 Else[employer] Inc.  Blood Glucose Monitoring, Adult Monitoring your blood glucose (also know as blood sugar) helps you to manage your diabetes. It also helps you and your health care provider monitor your diabetes and determine how well your treatment plan is working. WHY SHOULD YOU MONITOR YOUR BLOOD GLUCOSE?  It can help you understand how food, exercise, and medicine affect your blood glucose.  It allows you to know what your blood glucose is at any given moment. You can quickly tell if you are having low blood glucose (hypoglycemia) or high blood glucose (hyperglycemia).  It can help you and your health care provider know how to adjust your medicines.  It can help you understand how to manage an illness or adjust medicine for exercise. WHEN SHOULD YOU TEST? Your health care provider will help you decide how often you should check your blood glucose. This may depend on the type of diabetes you have, your diabetes control, or the types of medicines you are taking. Be sure to write down all of your blood glucose readings so that this information can be reviewed with your health care provider. See below for examples of testing times that your health care provider may suggest. Type 1 Diabetes  Test at least 2 times per day if your diabetes is well controlled, if you are using an insulin pump, or if you perform  multiple daily injections.  If your diabetes is not well controlled or if you are sick, you may need to test more often.  It is a good idea to also test:  Before every insulin injection.  Before and after exercise.  Between meals and 2 hours after a meal.  Occasionally between 2:00 a.m. and 3:00 a.m. Type 2 Diabetes  If you are taking insulin, test at least 2 times per day. However, it is best to test before every insulin injection.  If you take medicines by mouth (orally), test 2 times a day.  If you are on a controlled diet, test once a day.  If your diabetes is not well controlled or if you are sick, you may need to monitor more often. HOW TO MONITOR YOUR BLOOD GLUCOSE Supplies Needed  Blood glucose meter.  Test strips for your meter. Each meter has its own strips. You must use the strips that go with your own meter.  A pricking needle (lancet).  A device that holds the lancet (lancing device).  A journal or log book to write down your results. Procedure  Wash your hands with soap and water. Alcohol is not preferred.  Prick the side of your finger (not the tip) with the lancet.  Gently milk the finger until a small drop of blood appears.  Follow the instructions that come with your meter for inserting the test strip, applying blood to the  strip, and using your blood glucose meter. Other Areas to Get Blood for Testing Some meters allow you to use other areas of your body (other than your finger) to test your blood. These areas are called alternative sites. The most common alternative sites are:  The forearm.  The thigh.  The back area of the lower leg.  The palm of the hand. The blood flow in these areas is slower. Therefore, the blood glucose values you get may be delayed, and the numbers are different from what you would get from your fingers. Do not use alternative sites if you think you are having hypoglycemia. Your reading will not be accurate. Always use a  finger if you are having hypoglycemia. Also, if you cannot feel your lows (hypoglycemia unawareness), always use your fingers for your blood glucose checks. ADDITIONAL TIPS FOR GLUCOSE MONITORING  Do not reuse lancets.  Always carry your supplies with you.  All blood glucose meters have a 24-hour "hotline" number to call if you have questions or need help.  Adjust (calibrate) your blood glucose meter with a control solution after finishing a few boxes of strips. BLOOD GLUCOSE RECORD KEEPING It is a good idea to keep a daily record or log of your blood glucose readings. Most glucose meters, if not all, keep your glucose records stored in the meter. Some meters come with the ability to download your records to your home computer. Keeping a record of your blood glucose readings is especially helpful if you are wanting to look for patterns. Make notes to go along with the blood glucose readings because you might forget what happened at that exact time. Keeping good records helps you and your health care provider to work together to achieve good diabetes management.    This information is not intended to replace advice given to you by your health care provider. Make sure you discuss any questions you have with your health care provider.   Document Released: 07/26/2003 Document Revised: 08/13/2014 Document Reviewed: 12/15/2012 Elsevier Interactive Patient Education [employer].

## 2015-06-25 NOTE — Progress Notes (Signed)
Received report from [facility] in [facility] regarding transfer of patient.

## 2015-06-26 LAB — GLUCOSE, CAPILLARY
Glucose-Capillary: 274 mg/dL — ABNORMAL HIGH (ref 65–99)
Glucose-Capillary: 285 mg/dL — ABNORMAL HIGH (ref 65–99)

## 2015-06-26 LAB — GLUCOSE, RANDOM: GLUCOSE: 344 mg/dL — AB (ref 65–99)

## 2015-06-26 MED ORDER — BLOOD GLUCOSE MONITOR KIT
PACK | Status: DC
Start: 2015-06-26 — End: 2015-09-24

## 2015-06-26 MED ORDER — INSULIN DETEMIR 100 UNIT/ML ~~LOC~~ SOLN: 20.0000 [IU] | Freq: Every day | SUBCUTANEOUS | Status: DC

## 2015-06-26 MED ORDER — SYRINGE (DISPOSABLE) 1 ML MISC: 1.0000 | Freq: Every day | Status: DC

## 2015-06-26 MED ORDER — INSULIN ASPART 100 UNIT/ML ~~LOC~~ SOLN: 0.0000 [IU] | Freq: Three times a day (TID) | SUBCUTANEOUS | Status: DC

## 2015-06-26 MED ORDER — GLIPIZIDE ER 2.5 MG PO TB24: 2.5000 mg | ORAL_TABLET | Freq: Every day | ORAL | Status: DC

## 2015-06-26 NOTE — Care Management Note (Signed)
Case Management Note  Patient Details  Name: ZYARIA MANGIAPANE MRN: [id number] Date of Birth: [birth year]  Subjective/Objective:    Discussed discharge planning with Ms Partch who is a new insulin patient. Her [facility] nurse is teaching her to draw up insulin into an insulin syringe correctly and providing diabetic teaching. A referral has been made to [facility] requesting an RN to follow Ms Shales at home for a few weeks to make sure that she is doing well with her diabetes diagnosis and self administering her insulin.                  Action/Plan:   Expected Discharge Date:                  Expected Discharge Plan:     In-House Referral:     Discharge planning Services     Post Acute Care Choice:    Choice offered to:     DME Arranged:    DME Agency:     HH Arranged:    [place] Agency:     Status of Service:     Medicare Important Message Given:    Date Medicare IM Given:    Medicare IM give by:    Date Additional Medicare IM Given:    Additional Medicare Important Message give by:     If discussed at [place] of Stay Meetings, dates discussed:    Additional Comments:  Maitland Muhlbauer A, RN 06/26/2015, 10:51 AM

## 2015-06-26 NOTE — Progress Notes (Signed)
Talked with the diabetic coordinator regarding diabetic teaching for the pt.  She stated that they are not in house on the weekend and the nurses will do the teachings.  She stressed to make sure that the pt knows the difference between hyper and hypoglycemia.  Also to stress to the pt that the Levemir is an long acting insulin and to educate what signs and symptoms of hyper and hypoglycemia to watch for.    Talked with the pt and educated the pt on diabetes and how to check her glucose levels.  Pt verbalized understanding. Pt was taught how to draw up the insulin and return demonstrations done well. Encouraged pt to call back to the hospital and talk to our diabetes cordinator if she has further questions or concerns,.

## 2015-06-26 NOTE — Progress Notes (Signed)
[name] [place] NAME: Allison Cooke    MR#:  [id number]  DATE OF BIRTH:  March 11, 1961  SUBJECTIVE:  CHIEF COMPLAINT:   Chief Complaint  Patient presents with  . Fall   Came with hyperglycemia- under control now, though to discharge- but again blood sugar > 400.  pt have no complains.  REVIEW OF SYSTEMS:  CONSTITUTIONAL: No fever, fatigue or weakness.  EYES: No blurred or double vision.  EARS, NOSE, AND THROAT: No tinnitus or ear pain.  RESPIRATORY: No cough, shortness of breath, wheezing or hemoptysis.  CARDIOVASCULAR: No chest pain, orthopnea, edema.  GASTROINTESTINAL: No nausea, vomiting, diarrhea or abdominal pain.  GENITOURINARY: No dysuria, hematuria.  ENDOCRINE: No polyuria, nocturia,  HEMATOLOGY: No anemia, easy bruising or bleeding SKIN: No rash or lesion. MUSCULOSKELETAL: No joint pain or arthritis.   NEUROLOGIC: No tingling, numbness, weakness.  PSYCHIATRY: No anxiety or depression.   ROS  DRUG ALLERGIES:   Allergies  Allergen Reactions  . Shrimp [Shellfish Allergy]     Pt stated she is able to eat shrimp now!    VITALS:  Blood pressure 142/77, pulse 65, temperature 98 F (36.7 C), temperature source Oral, resp. rate 16, height 5\' 2"  (1.575 m), weight 71.9 kg (158 lb 8.2 oz), SpO2 100 %.  PHYSICAL EXAMINATION:  GENERAL:  54 y.o.-year-old patient lying in the bed with no acute distress.  EYES: Pupils equal, round, reactive to light and accommodation. No scleral icterus. Extraocular muscles intact.  HEENT: Head atraumatic, normocephalic. Oropharynx and nasopharynx clear.  NECK:  Supple, no jugular venous distention. No thyroid enlargement, no tenderness.  LUNGS: Normal breath sounds bilaterally, no wheezing, rales,rhonchi or crepitation. No use of accessory muscles of respiration.  CARDIOVASCULAR: S1, S2 normal. No murmurs, rubs, or gallops.  ABDOMEN: Soft, nontender, nondistended. Bowel sounds present.  No organomegaly or mass.  EXTREMITIES: No pedal edema, cyanosis, or clubbing.  NEUROLOGIC: Cranial nerves II through XII are intact. Muscle strength 5/5 in all extremities. Sensation intact. Gait not checked.  PSYCHIATRIC: The patient is alert and oriented x 3.  SKIN: No obvious rash, lesion, or ulcer.   Physical Exam LABORATORY PANEL:   CBC  Recent Labs Lab 06/25/15 0742  WBC 13.1*  HGB 11.7*  HCT 34.4*  PLT 261   ------------------------------------------------------------------------------------------------------------------  Chemistries   Recent Labs Lab 06/25/15 0742 06/25/15 2338  NA 130*  --   K 3.9  --   CL 103  --   CO2 20*  --   GLUCOSE 346* 344*  BUN 14  --   CREATININE 0.66  --   CALCIUM 8.4*  --    ------------------------------------------------------------------------------------------------------------------  Cardiac Enzymes  Recent Labs Lab 06/24/15 0934  TROPONINI 0.06*   ------------------------------------------------------------------------------------------------------------------  RADIOLOGY:  Dg Chest 2 View  06/24/2015  CLINICAL DATA:  Pt blacked out and fell last night. Htn, sob, heart murmer. Smoker. EXAM: CHEST  2 VIEW COMPARISON:  06/08/2009 FINDINGS: The heart size and mediastinal contours are within normal limits. Both lungs are clear. No pleural effusion or pneumothorax. The visualized skeletal structures are unremarkable. IMPRESSION: No active cardiopulmonary disease. Electronically Signed   By: Lajean Manes M.D.   On: 06/24/2015 11:29    ASSESSMENT AND PLAN:   Principal Problem:   Hyperglycemia Active Problems:   Hyperkalemia   Hyponatremia   Nonketotic hyperglycinemia (HCC)   * Non ketotic hyperglycemia   No diagnosis of DM till now.    She is diabetic as BS  is till high.   Added metformin, glipizide and Lantus.   Montior on floor.   Explained about diet control to her and her sons.   Possible d/c tomorrow.  *  Hyponatremia Likely this is pseudohyponatremia  better now.  * Hyperkalemia No EKG changes, this can do secondary to very high blood sugar level. resolved.  * Hypertension Continue amlodipine  * Allergy reaction to scalp Resolved now continue Benadryl oral as needed.  no more complains.  * Smoking Counseled to quit smoking for 4 minutes and offered nicotine patch she said she would be fine without that and definitely consider about quitting her habit.   All the records are reviewed and case discussed with Care Management/Social Workerr. Management plans discussed with the patient, family and they are in agreement.  CODE STATUS: full  TOTAL TIME TAKING CARE OF THIS PATIENT: 35 minutes.     POSSIBLE D/C IN 1-2 DAYS, DEPENDING ON CLINICAL CONDITION.   Allison Cooke M.D on 06/26/2015   Between 7am to 6pm - Pager - [phone]  After 6pm go to [email or web address] - password [email or web address] [facility]  Office  [phone]  CC: Primary care physician; Enid Derry, MD  Note: This dictation was prepared with Dragon dictation along with smaller phrase technology. Any transcriptional errors that result from this process are unintentional.

## 2015-06-26 NOTE — Progress Notes (Signed)
Pt discharged to home with her friend.  Pt verbalzied understanding regarding hypo and hyperglycemia signs and symptoms to look out for and how to tweat it .Marland Kitchen Encouraged pt ot follow up with her PCP.

## 2015-06-28 ENCOUNTER — Telehealth: Payer: Self-pay

## 2015-06-28 ENCOUNTER — Ambulatory Visit: Payer: Medicaid Other | Admitting: Family Medicine

## 2015-06-28 DIAGNOSIS — Z794 Long term (current) use of insulin: Principal | ICD-10-CM

## 2015-06-28 DIAGNOSIS — E139 Other specified diabetes mellitus without complications: Secondary | ICD-10-CM | POA: Insufficient documentation

## 2015-06-28 DIAGNOSIS — E1365 Other specified diabetes mellitus with hyperglycemia: Secondary | ICD-10-CM

## 2015-06-28 DIAGNOSIS — IMO0002 Reserved for concepts with insufficient information to code with codable children: Secondary | ICD-10-CM

## 2015-06-28 DIAGNOSIS — E1165 Type 2 diabetes mellitus with hyperglycemia: Secondary | ICD-10-CM

## 2015-06-28 HISTORY — DX: Other specified diabetes mellitus with hyperglycemia: E13.65

## 2015-06-28 HISTORY — DX: Reserved for concepts with insufficient information to code with codable children: IMO0002

## 2015-06-28 NOTE — Telephone Encounter (Signed)
I talked to the patient; explained she did not have diabetes prior to this episode She was supposed to see me for hospital f/u this morning but had transportation issues; she is rescheduled to 07/11/15 (that is too far out) Blood sugar was 501; she said that the nurses in the hospital were giving her insulin when her sugars went high; I explained that she can NOT do that with the type of insulin they sent her home on She is on long-acting insulin; she took an insulin shot about two hours ago; she took twenty units last night and then took another twenty units just two hours ago because her sugar was high She noticed that when her sugar ran high, they would give her a shot so she thought she would give herself a shot I told her to [place] do that; the shot the nurses gave her in the hospital was a different type of insulin Move the time for insulin to 10 am now; don't give herself another shot of long-acting insulin She is on two oral agents; she will take an extra sulfonylurea right now and go to the ER if blood sugars are staying high and not coming down Again, do NOT use the long-acting like they were doing in the hospital Keep appt with [facility] tomorrow and then still keep appt with me December 5th She is going to have some nurse come to the home but she hasn't actually come yet

## 2015-06-28 NOTE — Telephone Encounter (Signed)
Today her glucose it was 501. Wants to know what to do.

## 2015-06-29 ENCOUNTER — Encounter: Payer: Self-pay | Admitting: Unknown Physician Specialty

## 2015-06-29 ENCOUNTER — Ambulatory Visit (INDEPENDENT_AMBULATORY_CARE_PROVIDER_SITE_OTHER): Payer: Medicaid Other | Admitting: Unknown Physician Specialty

## 2015-06-29 VITALS — BP 131/90 | HR 88 | Temp 97.2°F | Ht 64.6 in | Wt 162.4 lb

## 2015-06-29 DIAGNOSIS — R350 Frequency of micturition: Secondary | ICD-10-CM

## 2015-06-29 DIAGNOSIS — R739 Hyperglycemia, unspecified: Secondary | ICD-10-CM | POA: Diagnosis not present

## 2015-06-29 DIAGNOSIS — E871 Hypo-osmolality and hyponatremia: Secondary | ICD-10-CM | POA: Diagnosis not present

## 2015-06-29 LAB — GLUCOSE HEMOCUE WAIVED: Glu Hemocue Waived: 403 mg/dL — ABNORMAL HIGH (ref 65–99)

## 2015-06-29 LAB — UA/M W/RFLX CULTURE, ROUTINE
Bilirubin, UA: NEGATIVE
LEUKOCYTES UA: NEGATIVE
Nitrite, UA: NEGATIVE
Protein, UA: NEGATIVE
RBC UA: NEGATIVE
Specific Gravity, UA: <1.005 — ABNORMAL LOW (ref 1.005–1.030)
Urobilinogen, Ur: 0.2 mg/dL (ref 0.2–1.0)
pH, UA: 5 (ref 5.0–7.5)

## 2015-06-29 LAB — MICROALBUMIN, URINE WAIVED
Creatinine, Urine Waived: 50 mg/dL (ref 10–300)
MICROALB, UR WAIVED: 30 mg/L — AB (ref 0–19)

## 2015-06-29 LAB — BAYER DCA HB A1C WAIVED: HB A1C: 13.8 % — AB (ref <7.0)

## 2015-06-29 MED ORDER — GLIPIZIDE ER 2.5 MG PO TB24: 5.0000 mg | ORAL_TABLET | Freq: Every day | ORAL | Status: DC

## 2015-06-29 NOTE — Patient Instructions (Addendum)
Only take Levimir (long acting insulin) once a day.  Increase based on fasting blood sugar.  If your fasting blood sugar (in the morning before you eat) is above 130, increase the next dose of Levimir by 2 units.  If it's below 130, decrease by 2u.  Do this every day.    If your blood sugar before meals is greater than 180, take 5 units of Novolog (short acting insulin)  Increase Glipizide to 5 mg  DASH Eating Plan DASH stands for "Dietary Approaches to Stop Hypertension." The DASH eating plan is a healthy eating plan that has been shown to reduce high blood pressure (hypertension). Additional health benefits may include reducing the risk of type 2 diabetes mellitus, heart disease, and stroke. The DASH eating plan may also help with weight loss. WHAT DO I NEED TO KNOW ABOUT THE DASH EATING PLAN? For the DASH eating plan, you will follow these general guidelines:  Choose foods with a percent daily value for sodium of less than 5% (as listed on the food label).  Use salt-free seasonings or herbs instead of table salt or sea salt.  Check with your health care provider or pharmacist before using salt substitutes.  Eat lower-sodium products, often labeled as "lower sodium" or "no salt added."  Eat fresh foods.  Eat more vegetables, fruits, and low-fat dairy products.  Choose whole grains. Look for the word "whole" as the first word in the ingredient list.  Choose fish and skinless chicken or [place] more often than red meat. Limit fish, poultry, and meat to 6 oz (170 g) each day.  Limit sweets, desserts, sugars, and sugary drinks.  Choose heart-healthy fats.  Limit cheese to 1 oz (28 g) per day.  Eat more home-cooked food and less restaurant, buffet, and fast food.  Limit fried foods.  Cook foods using methods other than frying.  Limit canned vegetables. If you do use them, rinse them well to decrease the sodium.  When eating at a restaurant, ask that your food be prepared with  less salt, or no salt if possible. WHAT FOODS CAN I EAT? Seek help from a dietitian for individual calorie needs. Grains Whole grain or whole wheat bread. Brown rice. Whole grain or whole wheat pasta. Quinoa, bulgur, and whole grain cereals. Low-sodium cereals. Corn or whole wheat flour tortillas. Whole grain cornbread. Whole grain crackers. Low-sodium crackers. Vegetables Fresh or frozen vegetables (raw, steamed, roasted, or grilled). Low-sodium or reduced-sodium tomato and vegetable juices. Low-sodium or reduced-sodium tomato sauce and paste. Low-sodium or reduced-sodium canned vegetables.  Fruits All fresh, canned (in natural juice), or frozen fruits. Meat and Other Protein Products Ground beef (85% or leaner), grass-fed beef, or beef trimmed of fat. Skinless chicken or [place]. Ground chicken or [place]. Pork trimmed of fat. All fish and seafood. Eggs. Dried beans, peas, or lentils. Unsalted nuts and seeds. Unsalted canned beans. Dairy Low-fat dairy products, such as skim or 1% milk, 2% or reduced-fat cheeses, low-fat ricotta or cottage cheese, or plain low-fat yogurt. Low-sodium or reduced-sodium cheeses. Fats and Oils Tub margarines without trans fats. Light or reduced-fat mayonnaise and salad dressings (reduced sodium). Avocado. Safflower, olive, or canola oils. Natural peanut or almond butter. Other Unsalted popcorn and pretzels. The items listed above may not be a complete list of recommended foods or beverages. Contact your dietitian for more options. WHAT FOODS ARE NOT RECOMMENDED? Grains White bread. White pasta. White rice. Refined cornbread. Bagels and croissants. Crackers that contain trans fat. Vegetables Creamed or  fried vegetables. Vegetables in a cheese sauce. Regular canned vegetables. Regular canned tomato sauce and paste. Regular tomato and vegetable juices. Fruits Dried fruits. Canned fruit in light or heavy syrup. Fruit juice. Meat and Other Protein Products Fatty cuts  of meat. Ribs, chicken wings, bacon, sausage, bologna, salami, chitterlings, fatback, hot dogs, bratwurst, and packaged luncheon meats. Salted nuts and seeds. Canned beans with salt. Dairy Whole or 2% milk, cream, half-and-half, and cream cheese. Whole-fat or sweetened yogurt. Full-fat cheeses or blue cheese. Nondairy creamers and whipped toppings. Processed cheese, cheese spreads, or cheese curds. Condiments Onion and garlic salt, seasoned salt, table salt, and sea salt. Canned and packaged gravies. Worcestershire sauce. Tartar sauce. Barbecue sauce. Teriyaki sauce. Soy sauce, including reduced sodium. Steak sauce. Fish sauce. Oyster sauce. Cocktail sauce. Horseradish. Ketchup and mustard. Meat flavorings and tenderizers. Bouillon cubes. Hot sauce. Tabasco sauce. Marinades. Taco seasonings. Relishes. Fats and Oils Butter, stick margarine, lard, shortening, ghee, and bacon fat. Coconut, palm kernel, or palm oils. Regular salad dressings. Other Pickles and olives. Salted popcorn and pretzels. The items listed above may not be a complete list of foods and beverages to avoid. Contact your dietitian for more information. WHERE CAN I FIND MORE INFORMATION? National Heart, Lung, and Blood Institute: travelstabloid.com   This information is not intended to replace advice given to you by your health care provider. Make sure you discuss any questions you have with your health care provider.   Document Released: 07/12/2011 Document Revised: 08/13/2014 Document Reviewed: 05/27/2013 Elsevier Interactive Patient Education [employer].

## 2015-06-29 NOTE — Progress Notes (Signed)
BP 131/90 mmHg  Pulse 88  Temp(Src) 97.2 F (36.2 C)  Ht 5' 4.6" (1.641 m)  Wt 162 lb 6.4 oz (73.664 kg)  BMI 27.36 kg/m2  SpO2 97%  LMP 04/29/2015 (Approximate)   Subjective:    Patient ID: Allison Cooke, female    DOB: [birth year], 54 y.o.   MRN: [id number]  HPI: Allison Cooke is a 54 y.o. female  Chief Complaint  Patient presents with  . Hospitalization Follow-up    pt states she was in the hospital for elevated blood glucose, states it was over 1,000   Pt with hyperglycemia secondary to steroids to treat an allergic reaction.  She was admitted to the hospital and discharged on long acting insuln, Glipizide and Metformin.  She is checking her blood sugar every 2 hours.  In the AM it is 400.  Later in the day it falls "just a little bit."  No weight loss but frequent urination.   Sodium also noted to be low.  Pt is avoiding sugar drinks and foods.     Relevant past medical, surgical, family and social history reviewed and updated as indicated. Interim medical history since our last visit reviewed. Allergies and medications reviewed and updated.  Review of Systems  Per HPI unless specifically indicated above     Objective:    BP 131/90 mmHg  Pulse 88  Temp(Src) 97.2 F (36.2 C)  Ht 5' 4.6" (1.641 m)  Wt 162 lb 6.4 oz (73.664 kg)  BMI 27.36 kg/m2  SpO2 97%  LMP 04/29/2015 (Approximate)  Wt Readings from Last 3 Encounters:  06/29/15 162 lb 6.4 oz (73.664 kg)  06/24/15 158 lb 8.2 oz (71.9 kg)  06/19/15 165 lb (74.844 kg)    Physical Exam  Constitutional: She is oriented to person, place, and time. She appears well-developed and well-nourished. No distress.  HENT:  Head: Normocephalic and atraumatic.  Eyes: Conjunctivae and lids are normal. Right eye exhibits no discharge. Left eye exhibits no discharge. No scleral icterus.  Cardiovascular: Normal rate, regular rhythm and normal heart sounds.   Pulmonary/Chest: Effort normal and breath sounds normal. No  respiratory distress.  Abdominal: Normal appearance. There is no splenomegaly or hepatomegaly.  Musculoskeletal: Normal range of motion.  Neurological: She is alert and oriented to person, place, and time.  Skin: Skin is intact. No rash noted. No pallor.  Psychiatric: She has a normal mood and affect. Her behavior is normal. Judgment and thought content normal.      Assessment & Plan:   Problem List Items Addressed This Visit      Unprioritized   Hyperglycemia - Primary    BS was 400 in the office.  Due to such high morning BS. I believe she needs more basal insulin.  I will give her a schedule of titration of 2 units nightly increase or decrease depending on AM BS.  Add 5 units of Novolog with meals if blood sugar is above 180.  Increase Glipizide to 5 mg.        Relevant Medications   B-D INS SYR ULTRAFINE 1CC/31G 31G X 5/16" 1 ML MISC   diphenhydrAMINE (BENADRYL) 25 mg capsule   Other Relevant Orders   Comprehensive metabolic panel   Bayer DCA Hb A1c Waived (Completed)   Glucose Hemocue Waived (Completed)   UA/M w/rflx Culture, Routine (Completed)   Microalbumin, Urine Waived (Completed)   Hyponatremia    Check sodium levels today.  Stop HCTZ until this is further resolved  Other Visit Diagnoses    Urinary frequency            Follow up plan: No Follow-up on file.

## 2015-06-29 NOTE — Assessment & Plan Note (Signed)
Check sodium levels today.  Stop HCTZ until this is further resolved

## 2015-06-29 NOTE — Assessment & Plan Note (Signed)
BS was 400 in the office.  Due to such high morning BS. I believe she needs more basal insulin.  I will give her a schedule of titration of 2 units nightly increase or decrease depending on AM BS.  Add 5 units of Novolog with meals if blood sugar is above 180.  Increase Glipizide to 5 mg.

## 2015-06-30 LAB — COMPREHENSIVE METABOLIC PANEL
A/G RATIO: 1.3 (ref 1.1–2.5)
ALT: 24 IU/L (ref 0–32)
AST: 13 IU/L (ref 0–40)
Albumin: 4.1 g/dL (ref 3.5–5.5)
Alkaline Phosphatase: 112 IU/L (ref 39–117)
BUN / CREAT RATIO: 26 — AB (ref 9–23)
BUN: 19 mg/dL (ref 6–24)
Bilirubin Total: <0.2 mg/dL (ref 0.0–1.2)
CALCIUM: 10.1 mg/dL (ref 8.7–10.2)
CO2: 25 mmol/L (ref 18–29)
CREATININE: 0.72 mg/dL (ref 0.57–1.00)
Chloride: 86 mmol/L — ABNORMAL LOW (ref 97–106)
GFR, EST AFRICAN AMERICAN: 110 mL/min/{1.73_m2} (ref >59)
GFR, EST NON AFRICAN AMERICAN: 95 mL/min/{1.73_m2} (ref >59)
Globulin, Total: 3.1 g/dL (ref 1.5–4.5)
Glucose: 400 mg/dL — ABNORMAL HIGH (ref 65–99)
Potassium: 4.6 mmol/L (ref 3.5–5.2)
SODIUM: 129 mmol/L — AB (ref 136–144)
TOTAL PROTEIN: 7.2 g/dL (ref 6.0–8.5)

## 2015-07-01 ENCOUNTER — Telehealth: Payer: Self-pay | Admitting: Unknown Physician Specialty

## 2015-07-01 NOTE — Telephone Encounter (Signed)
I called the patient to ask how she is doing.  Her blood sugar is coming down and her blood sugar was 260 this AM

## 2015-07-04 ENCOUNTER — Ambulatory Visit (INDEPENDENT_AMBULATORY_CARE_PROVIDER_SITE_OTHER): Payer: Medicaid Other | Admitting: Unknown Physician Specialty

## 2015-07-04 ENCOUNTER — Encounter: Payer: Self-pay | Admitting: Unknown Physician Specialty

## 2015-07-04 VITALS — BP 110/76 | HR 99 | Temp 98.1°F | Ht 64.6 in | Wt 166.6 lb

## 2015-07-04 DIAGNOSIS — R739 Hyperglycemia, unspecified: Secondary | ICD-10-CM

## 2015-07-04 NOTE — Progress Notes (Signed)
BP 110/76 mmHg  Pulse 99  Temp(Src) 98.1 F (36.7 C)  Ht 5' 4.6" (1.641 m)  Wt 166 lb 9.6 oz (75.569 kg)  BMI 28.06 kg/m2  SpO2 98%  LMP 04/29/2015 (Approximate)   Subjective:    Patient ID: Allison Cooke, female    DOB: 05-31-1961, 54 y.o.   MRN: [id number]  HPI: [name] is a 54 y.o. female  Chief Complaint  Patient presents with  . Hyperglycemia   Diabetes Her blood sugar is low 200's this AM  Before that it was 170.  She is taking 5 units of mealtime insulin.  Pt has not titrated up her long acting insulin due to confusion but she wrote down new notes.  She does have a visiting nurse.    Relevant past medical, surgical, family and social history reviewed and updated as indicated. Interim medical history since our last visit reviewed. Allergies and medications reviewed and updated.  Review of Systems  Per HPI unless specifically indicated above     Objective:    BP 110/76 mmHg  Pulse 99  Temp(Src) 98.1 F (36.7 C)  Ht 5' 4.6" (1.641 m)  Wt 166 lb 9.6 oz (75.569 kg)  BMI 28.06 kg/m2  SpO2 98%  LMP 04/29/2015 (Approximate)  Wt Readings from Last 3 Encounters:  07/04/15 166 lb 9.6 oz (75.569 kg)  06/29/15 162 lb 6.4 oz (73.664 kg)  06/24/15 158 lb 8.2 oz (71.9 kg)    Physical Exam  Constitutional: She is oriented to person, place, and time. She appears well-developed and well-nourished. No distress.  HENT:  Head: Normocephalic and atraumatic.  Eyes: Conjunctivae and lids are normal. Right eye exhibits no discharge. Left eye exhibits no discharge. No scleral icterus.  Cardiovascular: Normal rate.   Pulmonary/Chest: Effort normal.  Abdominal: Normal appearance. There is no splenomegaly or hepatomegaly.  Musculoskeletal: Normal range of motion.  Neurological: She is alert and oriented to person, place, and time.  Skin: Skin is intact. No rash noted. No pallor.  Psychiatric: She has a normal mood and affect. Her behavior is normal. Judgment and  thought content normal.    Results for orders placed or performed in visit on 06/29/15  Comprehensive metabolic panel  Result Value Ref Range   Glucose 400 (H) 65 - 99 mg/dL   BUN 19 6 - 24 mg/dL   Creatinine, Ser 0.72 0.57 - 1.00 mg/dL   GFR calc non Af Amer 95 >59 mL/min/1.73   GFR calc Af Amer 110 >59 mL/min/1.73   BUN/Creatinine Ratio 26 (H) 9 - 23   Sodium 129 (L) 136 - 144 mmol/L   Potassium 4.6 3.5 - 5.2 mmol/L   Chloride 86 (L) 97 - 106 mmol/L   CO2 25 18 - 29 mmol/L   Calcium 10.1 8.7 - 10.2 mg/dL   Total Protein 7.2 6.0 - 8.5 g/dL   Albumin 4.1 3.5 - 5.5 g/dL   Globulin, Total 3.1 1.5 - 4.5 g/dL   Albumin/Globulin Ratio 1.3 1.1 - 2.5   Bilirubin Total <0.2 0.0 - 1.2 mg/dL   Alkaline Phosphatase 112 39 - 117 IU/L   AST 13 0 - 40 IU/L   ALT 24 0 - 32 IU/L  Bayer DCA Hb A1c Waived  Result Value Ref Range   Bayer DCA Hb A1c Waived 13.8 (H) <7.0 %  Glucose Hemocue Waived  Result Value Ref Range   Glu Hemocue Waived 403 (H) 65 - 99 mg/dL  UA/M w/rflx Culture, Routine  Result  Value Ref Range   Specific Gravity, UA <1.005 (L) 1.005 - 1.030   pH, UA 5.0 5.0 - 7.5   Color, UA Yellow Yellow   Appearance Ur Clear Clear   Leukocytes, UA Negative Negative   Protein, UA Negative Negative/Trace   Glucose, UA 3+ (A) Negative   Ketones, UA 1+ (A) Negative   RBC, UA Negative Negative   Bilirubin, UA Negative Negative   Urobilinogen, Ur 0.2 0.2 - 1.0 mg/dL   Nitrite, UA Negative Negative  Microalbumin, Urine Waived  Result Value Ref Range   Microalb, Ur Waived 30 (H) 0 - 19 mg/L   Creatinine, Urine Waived 50 10 - 300 mg/dL   Microalb/Creat Ratio 30-300 (H) <30 mg/g      Assessment & Plan:   Problem List Items Addressed This Visit      Unprioritized   Hyperglycemia - Primary       Follow up plan: Return for November 5th with Dr. Sanda Klein.

## 2015-07-07 ENCOUNTER — Encounter: Payer: Self-pay | Admitting: General Surgery

## 2015-07-07 ENCOUNTER — Other Ambulatory Visit: Payer: Medicaid Other

## 2015-07-07 ENCOUNTER — Ambulatory Visit (INDEPENDENT_AMBULATORY_CARE_PROVIDER_SITE_OTHER): Payer: Medicaid Other | Admitting: General Surgery

## 2015-07-07 VITALS — BP 120/70 | HR 70 | Resp 14 | Ht 62.0 in | Wt 166.0 lb

## 2015-07-07 DIAGNOSIS — N6452 Nipple discharge: Secondary | ICD-10-CM

## 2015-07-07 NOTE — Progress Notes (Signed)
Patient ID: Allison Cooke, female   DOB: 06-06-1961, 54 y.o.   MRN: [id number]  Chief Complaint  Patient presents with  . Follow-up    HPI Allison Cooke is a 54 y.o. female.  Here today for follow up left nipple drainage. She states it is about the same. She notices the blood tinged drainage about every other day. No breast pain. She was evaluated for this in August with finding of dilated ducts over large area lateral to the nipple. Core biopsy was performed showing fibrocystic changes and no atypia. The patient had not followed up after due to other health issues. Since that time, she has been placed on insulin. Also, one week ago she fell and injured right knee. I have reviewed the history of present illness with the patient. HPI  Past Medical History  Diagnosis Date  . Hypertension   . Pre-diabetes   . SOB (shortness of breath)   . Heart murmur   . Headache   . Arthritis   . Anemia   . Seizures ([place])     as a child  . Diabetes mellitus without complication [facility])     Past Surgical History  Procedure Laterality Date  . Cholecystectomy    . Cesarean section    . Fine needle aspiration Left     breast cyst, benign, age 27?  Marland Kitchen Breast surgery Left   . Direct laryngoscopy N/A 04/26/2015    Procedure: Microlaryngoscopy with exxcision of vocal cord polyp ;  Surgeon: Beverly Gust, MD;  Location: [facility] ORS;  Service: ENT;  Laterality: N/A;    Family History  Problem Relation Age of Onset  . Hypertension Mother   . Heart disease Mother   . Hyperlipidemia Mother   . Diabetes Mother   . Heart disease Father   . Hyperlipidemia Father   . Hypertension Father   . Breast cancer Cousin   . Diabetes Sister   . Hypertension Daughter   . Diabetes Maternal Grandmother   . Diabetes Sister     Social History Social History  Substance Use Topics  . Smoking status: Current Some Day Smoker -- 0.25 packs/day for 20 years    Types: Cigarettes  . Smokeless tobacco: Never Used  .  Alcohol Use: No    Allergies  Allergen Reactions  . Shrimp [Shellfish Allergy] Hives and Itching    Current Outpatient Prescriptions  Medication Sig Dispense Refill  . amLODipine (NORVASC) 5 MG tablet Take 1 tablet (5 mg total) by mouth daily. 30 tablet 0  . B-D INS SYR ULTRAFINE 1CC/31G 31G X 5/16" 1 ML MISC use at bedtime as directed WITH LEVEMIR  0  . blood glucose meter kit and supplies KIT Dispense based on patient and insurance preference. Use up to four times daily as directed. (FOR ICD-9 250.00, 250.01). 1 each 0  . diphenhydrAMINE (BENADRYL) 25 mg capsule Take 25 mg by mouth every 4 (four) hours as needed.    Marland Kitchen glipiZIDE (GLUCOTROL XL) 2.5 MG 24 hr tablet Take 2 tablets (5 mg total) by mouth daily with breakfast. 60 tablet 0  . insulin detemir (LEVEMIR) 100 UNIT/ML injection Inject 0.2 mLs (20 Units total) into the skin at bedtime. 10 mL 1  . metFORMIN (GLUCOPHAGE) 500 MG tablet Take 1 tablet (500 mg total) by mouth 2 (two) times daily with a meal. 60 tablet 0  . sertraline (ZOLOFT) 100 MG tablet Take 1 tablet (100 mg total) by mouth daily. 30 tablet 3  . Syringe,  Disposable, 1 ML MISC 1 Syringe by Does not apply route at bedtime. To use to take levemir injection. 100 each 0   No current facility-administered medications for this visit.   Facility-Administered Medications Ordered in Other Visits  Medication Dose Route Frequency Provider Last Rate Last Dose  . albuterol (PROVENTIL) (2.5 MG/3ML) 0.083% nebulizer solution 2.5 mg  2.5 mg Nebulization Once Arnetha Courser, MD   2.5 mg at 03/08/15 4540    Review of Systems Review of Systems  Constitutional: Negative.   Respiratory: Negative.   Cardiovascular: Negative.   Neurological: Positive for headaches.    Blood pressure 120/70, pulse 70, resp. rate 14, height 5' 2"  (1.575 m), weight 166 lb (75.297 kg), last menstrual period 04/29/2015.  Physical Exam Physical Exam  Constitutional: She is oriented to person, place, and  time. She appears well-developed and well-nourished.  HENT:  Mouth/Throat: Oropharynx is clear and moist.  Eyes: Conjunctivae are normal. No scleral icterus.  Neck: Neck supple.  Cardiovascular: Normal rate, regular rhythm and normal heart sounds.   Pulmonary/Chest: Effort normal and breath sounds normal. Right breast exhibits no inverted nipple, no mass, no nipple discharge, no skin change and no tenderness. Left breast exhibits mass and nipple discharge. Left breast exhibits no inverted nipple, no skin change and no tenderness.    Left breast with mild fullness to palpation lateral to the nipple. Area is tender, and pressure here elicits dark red discharge from nipple. Area appears similar to previous exam.  Abdominal: Soft. Bowel sounds are normal.  Lymphadenopathy:    She has no cervical adenopathy.    She has no axillary adenopathy.  Neurological: She is alert and oriented to person, place, and time.  Skin: Skin is warm and dry.  Psychiatric: Her behavior is normal.    Data Reviewed Previous lab reports and progress notes. Mammogram was normal. Left breast ultrasound repeated today. Findings similar to what they were in August. There are multiple markedly dilated ducts and cystic spaces extending from the nipple along the 3-4 o'clock direction for over 5 cm. Close to the nipple, some ill-defined echodensities seen in some of the ducts.   Assessment    Left breast mass with bloody nipple discharge, prior cytology had shown some atypia but core biopsy only showed fibrocystic changes. At this point, excision of this area is indicated to rule out underlying malignancy.     Plan    Recommend excision of involved area. Procedure and risks discussed in full and patient agreeable.     Patient's surgery has been scheduled for 07-14-15 at [facility].  PCP:  Candace Cruise 07/07/2015, 11:58 AM

## 2015-07-07 NOTE — Patient Instructions (Addendum)
The patient is aware to call back for any questions or concerns.  Patient's surgery has been scheduled for 07-14-15 at [facility].

## 2015-07-08 ENCOUNTER — Other Ambulatory Visit: Payer: Medicaid Other

## 2015-07-08 ENCOUNTER — Encounter: Payer: Self-pay | Admitting: *Deleted

## 2015-07-08 NOTE — Patient Instructions (Signed)
  Your procedure is scheduled on: 07-14-15 Report to [place]. To find out your arrival time please call [phone] between 1PM - 3PM on 07-13-15  Remember: Instructions that are not followed completely may result in serious medical risk, up to and including death, or upon the discretion of your surgeon and anesthesiologist your surgery may need to be rescheduled.    __X__ 1. Do not eat food or drink liquids after midnight. No gum chewing or hard candies.     __X__ 2. No Alcohol for 24 hours before or after surgery.   ____ 3. Bring all medications with you on the day of surgery if instructed.    ____ 4. Notify your doctor if there is any change in your medical condition     (cold, fever, infections).     Do not wear jewelry, make-up, hairpins, clips or nail polish.  Do not wear lotions, powders, or perfumes. You may wear deodorant.  Do not shave 48 hours prior to surgery. Men may shave face and neck.  Do not bring valuables to the hospital.    [facility] is not responsible for any belongings or valuables.               Contacts, dentures or bridgework may not be worn into surgery.  Leave your suitcase in the car. After surgery it may be brought to your room.  For patients admitted to the hospital, discharge time is determined by your  treatment team.   Patients discharged the day of surgery will not be allowed to drive home.   Please read over the following fact sheets that you were given:     _X___ Take these medicines the morning of surgery with A SIP OF WATER:    1. AMLODIPINE  2. SERTALINE  3.   4.  5.  6.  ____ Fleet Enema (as directed)   ____ Use CHG Soap as directed  ____ Use inhalers on the day of surgery  _X___ Stop metformin 2 days prior to surgery-LAST DOSE ON Monday 07-11-15    _X___ Take 1/2 of usual insulin dose the night before surgery and none on the morning of surgery.   ____ Stop Coumadin/Plavix/aspirin-N/A  ____  Stop Anti-inflammatories-NO NSAIDS OR ASA PRODUCTS-TYLENOL OK   ____ Stop supplements until after surgery.    ____ Bring C-Pap to the hospital.

## 2015-07-11 ENCOUNTER — Encounter: Payer: Self-pay | Admitting: Family Medicine

## 2015-07-11 ENCOUNTER — Ambulatory Visit (INDEPENDENT_AMBULATORY_CARE_PROVIDER_SITE_OTHER): Payer: Medicaid Other | Admitting: Family Medicine

## 2015-07-11 VITALS — BP 144/92 | HR 86 | Temp 97.6°F | Wt 166.0 lb

## 2015-07-11 DIAGNOSIS — I10 Essential (primary) hypertension: Secondary | ICD-10-CM | POA: Diagnosis not present

## 2015-07-11 DIAGNOSIS — F331 Major depressive disorder, recurrent, moderate: Secondary | ICD-10-CM | POA: Diagnosis not present

## 2015-07-11 DIAGNOSIS — N63 Unspecified lump in breast: Secondary | ICD-10-CM

## 2015-07-11 DIAGNOSIS — E1165 Type 2 diabetes mellitus with hyperglycemia: Secondary | ICD-10-CM

## 2015-07-11 DIAGNOSIS — Z794 Long term (current) use of insulin: Secondary | ICD-10-CM | POA: Diagnosis not present

## 2015-07-11 DIAGNOSIS — N6320 Unspecified lump in the left breast, unspecified quadrant: Secondary | ICD-10-CM

## 2015-07-11 DIAGNOSIS — Z23 Encounter for immunization: Secondary | ICD-10-CM | POA: Diagnosis not present

## 2015-07-11 DIAGNOSIS — Z5181 Encounter for therapeutic drug level monitoring: Secondary | ICD-10-CM | POA: Diagnosis not present

## 2015-07-11 MED ORDER — TETANUS-DIPHTH-ACELL PERTUSSIS 5-2.5-18.5 LF-MCG/0.5 IM SUSP
0.5000 mL | Freq: Once | INTRAMUSCULAR | Status: AC
  Administered 2015-07-11: 0.5 mL via INTRAMUSCULAR

## 2015-07-11 MED ORDER — SERTRALINE HCL 100 MG PO TABS: 150.0000 mg | ORAL_TABLET | Freq: Every day | ORAL | Status: DC

## 2015-07-11 MED ORDER — LISINOPRIL 10 MG PO TABS: 10.0000 mg | ORAL_TABLET | Freq: Every day | ORAL | Status: DC

## 2015-07-11 MED ORDER — PEN NEEDLES 32G X 4 MM MISC: 1.0000 [IU] | Freq: Two times a day (BID) | Status: DC

## 2015-07-11 NOTE — Progress Notes (Signed)
BP 144/92 mmHg  Pulse 86  Temp(Src) 97.6 F (36.4 C)  Wt 166 lb (75.297 kg)  SpO2 99%  LMP 04/29/2015 (Approximate)   Subjective:    Patient ID: Allison Cooke, female    DOB: 11/18/1960, 54 y.o.   MRN: [id number]  HPI: Allison Cooke is a 54 y.o. female  Chief Complaint  Patient presents with  . Diabetes    She states she's been having diabetic foot pain and headaches  . Hypertension  . Lumpectomy    she is having a breast lump removed on Thursday. Wonders if we need to do any labs.  . Depression    she is struggling with depression due to all she has going on right now.   We discussed diabetes; recently diagnosed after taking prednisone, blood sugar went up over 1000; hospitalized, started on insulin; saw my colleague here while I was away on vacation; she says she is doing well, keeping the two kinds of insulin straight; blood sugars are usually 200 and above; lowest was 70 and she felt bad; she knows what foods to avoid; she is drinking zero calories; pain in the feet; blurred vision; urinating okay, 3-4 x at night; dry mouth; her mother had diabetes too  She is struggling with depression; no thoughts of self-harm; she has had lots of issues going on, hospital stay, has breast lumpectomy coming up later this week; things coming back from childhood are coming; having lots of crying; not sleeping well at night; weight stable; taking SSRI  Having surgery on Thursday, lumpectomy for breast mass  She is in a lot of knee pain (right knee); she fell when her blood sugar was so high and that was checked out when she was in the hospital; she is wearing a brace which helps but she has pain and buckling; swelling is better apparently; she does not recall seeing an orthopaedist in the hospital while she was there for her high blood sugar  Relevant past medical, surgical, family and social history reviewed and updated as indicated. Interim medical history since our last visit  reviewed. Allergies and medications reviewed and updated.  Review of Systems  Constitutional: Negative for fever.  Skin:       No sores on the skin  Psychiatric/Behavioral: Positive for dysphoric mood.  Per HPI unless specifically indicated above     Objective:    BP 144/92 mmHg  Pulse 86  Temp(Src) 97.6 F (36.4 C)  Wt 166 lb (75.297 kg)  SpO2 99%  LMP 04/29/2015 (Approximate)  Wt Readings from Last 3 Encounters:  07/11/15 166 lb (75.297 kg)  07/07/15 166 lb (75.297 kg)  07/04/15 166 lb 9.6 oz (75.569 kg)    Physical Exam  Constitutional: She appears well-developed and well-nourished. No distress.  Weight stable  HENT:  Mouth/Throat: Mucous membranes are not dry.  Eyes: Right eye exhibits no discharge. Left eye exhibits no discharge. Right conjunctiva is not injected. Left conjunctiva is not injected. No scleral icterus.  Cardiovascular: Normal rate and regular rhythm.   No extrasystoles are present.  Pulmonary/Chest: Effort normal and breath sounds normal. She has no decreased breath sounds.  Abdominal: Soft. She exhibits no distension.  Musculoskeletal:       Right shoulder: She exhibits decreased range of motion and tenderness.  Brace on the right knee; antalgic gait, patient favoring bearing weight on her left leg  Skin: Skin is warm. No rash noted. She is not diaphoretic. No pallor.  Psychiatric: Her speech is  normal and behavior is normal. Judgment and thought content normal. Cognition and memory are normal. She exhibits a depressed mood. She expresses no suicidal ideation.  teaful for a short time during our visit; good eye contact with examiner; cooperative, polite   Diabetic Foot Form - Detailed   Diabetic Foot Exam - detailed  Diabetic Foot exam was performed with the following findings:  Yes 07/11/2015 12:48 PM  Visual Foot Exam completed.:  Yes  Are the toenails long?:  No  Are the toenails thick?:  No  Is there elevated skin temparature?:  No  Are the  toenails ingrown?:  No  Normal Range of Motion:  Yes    Pulse Foot Exam completed.:  Yes  Right Dorsalis Pedis:  Present Left Dorsalis Pedis:  Present  Sensory Foot Exam Completed.:  Yes  Swelling:  No  Semmes-Weinstein Monofilament Test  R Site 1-Great Toe:  Pos L Site 1-Great Toe:  Pos  R Site 4:  Pos L Site 4:  Pos  R Site 5:  Pos L Site 5:  Pos       Lab Results  Component Value Date   HGBA1C 12.5* 06/23/2015  A1c done Jun 29, 2015 was 13.8     Assessment & Plan:   Problem List Items Addressed This Visit      Cardiovascular and Mediastinum   Essential hypertension, benign    Up today maybe from stress; add ACE-I; recheck urine microalbumin at f/u in late Feb with the 123456; goal systolic under [id number] mmHg; DASH guidelines will help      Relevant Medications   lisinopril (PRINIVIL,ZESTRIL) 10 MG tablet     Endocrine   Type 2 diabetes mellitus ([place]) - Primary    Still not quite controlled; foot exam by MD; eye exam UTD; will not recheck A1c until 3 months after last (due Feb 23rd or just after); will want to check lipids and get LDL under 100, get HDL over 50; monitor sugars 2x a day; patient reports keeping her insulins straight; refills of pen needles given; limit foods that can raise sugar; patient was encouraged to check feet every night and let me know right away of any new sores, infections, etc.      Relevant Medications   lisinopril (PRINIVIL,ZESTRIL) 10 MG tablet   Other Relevant Orders   Lipid Panel w/o Chol/HDL Ratio (Completed)     Other   Depression, major, recurrent (HCC)    Increase SSRI; supportive listening provided; call if needed; recheck in 3 weeks (asking staff to please do PHQ-9 at that visit)      Relevant Medications   sertraline (ZOLOFT) 100 MG tablet   Mass of left breast    Patient undergoing lumpectomy on Thursday      Medication monitoring encounter    Check CBC, CMP      Relevant Orders   CBC with Differential/Platelet (Completed)    Comprehensive metabolic panel (Completed)    Other Visit Diagnoses    Need for Tdap vaccination        vaccine given today; see AVS    Relevant Medications    Tdap (BOOSTRIX) injection 0.5 mL (Completed)       Follow up plan: Return in about 3 weeks (around 08/01/2015) for 30 minutes.  An after-visit summary was printed and given to the patient at [place].  Please see the patient instructions which may contain other information and recommendations beyond what is mentioned above in the assessment and plan.  Meds  ordered this encounter  Medications  . Multiple Vitamin (MULTIVITAMIN) tablet    Sig: Take 1 tablet by mouth daily.  . Tdap (BOOSTRIX) injection 0.5 mL    Sig:   . sertraline (ZOLOFT) 100 MG tablet    Sig: Take 1.5 tablets (150 mg total) by mouth daily.    Dispense:  45 tablet    Refill:  3    Pharmacist -- I'm increasing her dose; cancel old refills  . lisinopril (PRINIVIL,ZESTRIL) 10 MG tablet    Sig: Take 1 tablet (10 mg total) by mouth daily.    Dispense:  30 tablet    Refill:  1  . Insulin Pen Needle (PEN NEEDLES) 32G X 4 MM MISC    Sig: 1 Units by Does not apply route 2 (two) times daily.    Dispense:  100 each    Refill:  1

## 2015-07-11 NOTE — Assessment & Plan Note (Addendum)
Still not quite controlled; foot exam by MD; eye exam UTD; will not recheck A1c until 3 months after last (due Feb 23rd or just after); will want to check lipids and get LDL under 100, get HDL over 50; monitor sugars 2x a day; patient reports keeping her insulins straight; refills of pen needles given; limit foods that can raise sugar; patient was encouraged to check feet every night and let me know right away of any new sores, infections, etc.

## 2015-07-11 NOTE — Assessment & Plan Note (Addendum)
Up today maybe from stress; add ACE-I; recheck urine microalbumin at f/u in late Feb with the 123456; goal systolic under [id number] mmHg; DASH guidelines will help

## 2015-07-11 NOTE — Patient Instructions (Addendum)
Continue to monitor your sugars twice a day Return in 3 weeks Check your feet every night Start the new blood pressure medicine that will help protect your kidneys You received the vaccine to protect against tetanus and diphtheria and pertussis today; the tetanus and diphtheria portions will provide protection up to ten years, and the pertussis component will give you protection against whooping cough for life Call me if you need me

## 2015-07-11 NOTE — Assessment & Plan Note (Signed)
Check CBC, CMP

## 2015-07-12 LAB — COMPREHENSIVE METABOLIC PANEL
ALT: 27 IU/L (ref 0–32)
AST: 13 IU/L (ref 0–40)
Albumin/Globulin Ratio: 1.5 (ref 1.1–2.5)
Albumin: 4.3 g/dL (ref 3.5–5.5)
Alkaline Phosphatase: 76 IU/L (ref 39–117)
BUN/Creatinine Ratio: 18 (ref 9–23)
BUN: 11 mg/dL (ref 6–24)
CALCIUM: 10.2 mg/dL (ref 8.7–10.2)
CHLORIDE: 104 mmol/L (ref 97–106)
CO2: 26 mmol/L (ref 18–29)
Creatinine, Ser: 0.62 mg/dL (ref 0.57–1.00)
GFR calc non Af Amer: 103 mL/min/{1.73_m2} (ref >59)
GFR, EST AFRICAN AMERICAN: 118 mL/min/{1.73_m2} (ref >59)
GLUCOSE: 99 mg/dL (ref 65–99)
Globulin, Total: 2.8 g/dL (ref 1.5–4.5)
Potassium: 4.5 mmol/L (ref 3.5–5.2)
Sodium: 142 mmol/L (ref 136–144)
TOTAL PROTEIN: 7.1 g/dL (ref 6.0–8.5)

## 2015-07-12 LAB — CBC WITH DIFFERENTIAL/PLATELET
BASOS ABS: 0 10*3/uL (ref 0.0–0.2)
Basos: 0 %
EOS (ABSOLUTE): 0.3 10*3/uL (ref 0.0–0.4)
Eos: 5 %
Hematocrit: 33.7 % — ABNORMAL LOW (ref 34.0–46.6)
Hemoglobin: 11.2 g/dL (ref 11.1–15.9)
IMMATURE GRANS (ABS): 0 10*3/uL (ref 0.0–0.1)
IMMATURE GRANULOCYTES: 0 %
LYMPHS: 28 %
Lymphocytes Absolute: 1.8 10*3/uL (ref 0.7–3.1)
MCH: 30.3 pg (ref 26.6–33.0)
MCHC: 33.2 g/dL (ref 31.5–35.7)
MCV: 91 fL (ref 79–97)
Monocytes Absolute: 0.4 10*3/uL (ref 0.1–0.9)
Monocytes: 6 %
NEUTROS PCT: 61 %
Neutrophils Absolute: 3.9 10*3/uL (ref 1.4–7.0)
PLATELETS: 288 10*3/uL (ref 150–379)
RBC: 3.7 x10E6/uL — ABNORMAL LOW (ref 3.77–5.28)
RDW: 14.1 % (ref 12.3–15.4)
WBC: 6.4 10*3/uL (ref 3.4–10.8)

## 2015-07-12 LAB — LIPID PANEL W/O CHOL/HDL RATIO
Cholesterol, Total: 257 mg/dL — ABNORMAL HIGH (ref 100–199)
HDL: 71 mg/dL (ref >39)
LDL Calculated: 159 mg/dL — ABNORMAL HIGH (ref 0–99)
TRIGLYCERIDES: 133 mg/dL (ref 0–149)
VLDL CHOLESTEROL CAL: 27 mg/dL (ref 5–40)

## 2015-07-12 NOTE — Assessment & Plan Note (Signed)
Increase SSRI; supportive listening provided; call if needed; recheck in 3 weeks (asking staff to please do PHQ-9 at that visit)

## 2015-07-12 NOTE — Assessment & Plan Note (Signed)
Patient undergoing lumpectomy on Thursday

## 2015-07-14 ENCOUNTER — Encounter: Payer: Self-pay | Admitting: *Deleted

## 2015-07-14 ENCOUNTER — Ambulatory Visit: Payer: Medicaid Other | Admitting: Anesthesiology

## 2015-07-14 ENCOUNTER — Ambulatory Visit
Admission: RE | Admit: 2015-07-14 | Discharge: 2015-07-14 | Disposition: A | Discharge status: Home / Self Care | Payer: Medicaid Other | Source: Ambulatory Visit | Attending: General Surgery | Admitting: General Surgery

## 2015-07-14 ENCOUNTER — Encounter
Admission: RE | Disposition: A | Discharge status: Home / Self Care | Payer: Self-pay | Source: Ambulatory Visit | Attending: General Surgery

## 2015-07-14 DIAGNOSIS — Z8249 Family history of ischemic heart disease and other diseases of the circulatory system: Secondary | ICD-10-CM | POA: Diagnosis not present

## 2015-07-14 DIAGNOSIS — Z9049 Acquired absence of other specified parts of digestive tract: Secondary | ICD-10-CM | POA: Insufficient documentation

## 2015-07-14 DIAGNOSIS — I10 Essential (primary) hypertension: Secondary | ICD-10-CM | POA: Insufficient documentation

## 2015-07-14 DIAGNOSIS — J449 Chronic obstructive pulmonary disease, unspecified: Secondary | ICD-10-CM | POA: Diagnosis not present

## 2015-07-14 DIAGNOSIS — R011 Cardiac murmur, unspecified: Secondary | ICD-10-CM | POA: Diagnosis not present

## 2015-07-14 DIAGNOSIS — R0602 Shortness of breath: Secondary | ICD-10-CM | POA: Diagnosis not present

## 2015-07-14 DIAGNOSIS — D649 Anemia, unspecified: Secondary | ICD-10-CM | POA: Insufficient documentation

## 2015-07-14 DIAGNOSIS — Z79899 Other long term (current) drug therapy: Secondary | ICD-10-CM | POA: Diagnosis not present

## 2015-07-14 DIAGNOSIS — N6452 Nipple discharge: Secondary | ICD-10-CM | POA: Diagnosis present

## 2015-07-14 DIAGNOSIS — Z803 Family history of malignant neoplasm of breast: Secondary | ICD-10-CM | POA: Insufficient documentation

## 2015-07-14 DIAGNOSIS — E109 Type 1 diabetes mellitus without complications: Secondary | ICD-10-CM | POA: Insufficient documentation

## 2015-07-14 DIAGNOSIS — M1990 Unspecified osteoarthritis, unspecified site: Secondary | ICD-10-CM | POA: Insufficient documentation

## 2015-07-14 DIAGNOSIS — F329 Major depressive disorder, single episode, unspecified: Secondary | ICD-10-CM | POA: Insufficient documentation

## 2015-07-14 DIAGNOSIS — Z833 Family history of diabetes mellitus: Secondary | ICD-10-CM | POA: Insufficient documentation

## 2015-07-14 DIAGNOSIS — R51 Headache: Secondary | ICD-10-CM | POA: Insufficient documentation

## 2015-07-14 DIAGNOSIS — Z87891 Personal history of nicotine dependence: Secondary | ICD-10-CM | POA: Insufficient documentation

## 2015-07-14 DIAGNOSIS — Z794 Long term (current) use of insulin: Secondary | ICD-10-CM | POA: Diagnosis not present

## 2015-07-14 DIAGNOSIS — D242 Benign neoplasm of left breast: Secondary | ICD-10-CM | POA: Insufficient documentation

## 2015-07-14 HISTORY — PX: BREAST LUMPECTOMY: SHX2

## 2015-07-14 LAB — GLUCOSE, CAPILLARY
GLUCOSE-CAPILLARY: 161 mg/dL — AB (ref 65–99)
Glucose-Capillary: 148 mg/dL — ABNORMAL HIGH (ref 65–99)

## 2015-07-14 LAB — POCT PREGNANCY, URINE: PREG TEST UR: NEGATIVE

## 2015-07-14 SURGERY — BREAST LUMPECTOMY
Anesthesia: General | Laterality: Left | Wound class: Clean

## 2015-07-14 MED ORDER — FENTANYL CITRATE (PF) 100 MCG/2ML IJ SOLN: 25.0000 ug | INTRAMUSCULAR | Status: DC | PRN

## 2015-07-14 MED ORDER — ACETAMINOPHEN 10 MG/ML IV SOLN
INTRAVENOUS | Status: DC | PRN
  Administered 2015-07-14: 1000 mg via INTRAVENOUS

## 2015-07-14 MED ORDER — PROPOFOL 10 MG/ML IV BOLUS
INTRAVENOUS | Status: DC | PRN
  Administered 2015-07-14: 160 mg via INTRAVENOUS

## 2015-07-14 MED ORDER — FENTANYL CITRATE (PF) 100 MCG/2ML IJ SOLN
25.0000 ug | INTRAMUSCULAR | Status: AC | PRN
  Administered 2015-07-14 (×6): 25 ug via INTRAVENOUS

## 2015-07-14 MED ORDER — TRAMADOL HCL 50 MG PO TABS
ORAL_TABLET | ORAL | Status: AC
  Filled 2015-07-14: qty 1

## 2015-07-14 MED ORDER — TRAMADOL HCL 50 MG PO TABS
50.0000 mg | ORAL_TABLET | Freq: Four times a day (QID) | ORAL | Status: DC | PRN
Start: 2015-07-14 — End: 2015-08-03

## 2015-07-14 MED ORDER — GLYCOPYRROLATE 0.2 MG/ML IJ SOLN
INTRAMUSCULAR | Status: DC | PRN
  Administered 2015-07-14: .2 mg via INTRAVENOUS

## 2015-07-14 MED ORDER — FAMOTIDINE 20 MG PO TABS
ORAL_TABLET | ORAL | Status: AC
  Administered 2015-07-14: 20 mg via ORAL
  Filled 2015-07-14: qty 1

## 2015-07-14 MED ORDER — TRAMADOL HCL 50 MG PO TABS
50.0000 mg | ORAL_TABLET | Freq: Four times a day (QID) | ORAL | Status: DC | PRN
  Administered 2015-07-14: 50 mg via ORAL

## 2015-07-14 MED ORDER — FAMOTIDINE 20 MG PO TABS
20.0000 mg | ORAL_TABLET | Freq: Once | ORAL | Status: AC
  Administered 2015-07-14: 20 mg via ORAL

## 2015-07-14 MED ORDER — FENTANYL CITRATE (PF) 100 MCG/2ML IJ SOLN
INTRAMUSCULAR | Status: AC
  Administered 2015-07-14: 25 ug via INTRAVENOUS
  Filled 2015-07-14: qty 2

## 2015-07-14 MED ORDER — ACETAMINOPHEN 10 MG/ML IV SOLN
INTRAVENOUS | Status: AC
  Filled 2015-07-14: qty 100

## 2015-07-14 MED ORDER — ONDANSETRON HCL 4 MG/2ML IJ SOLN: 4.0000 mg | Freq: Once | INTRAMUSCULAR | Status: DC | PRN

## 2015-07-14 MED ORDER — CEFAZOLIN SODIUM-DEXTROSE 2-3 GM-% IV SOLR
INTRAVENOUS | Status: AC
Start: 2015-07-14 — End: 2015-07-14
  Administered 2015-07-14: 2 g via INTRAVENOUS
  Filled 2015-07-14: qty 50

## 2015-07-14 MED ORDER — SODIUM CHLORIDE 0.9 % IV SOLN
INTRAVENOUS | Status: DC
  Administered 2015-07-14: 07:00:00 via INTRAVENOUS

## 2015-07-14 MED ORDER — LIDOCAINE HCL (CARDIAC) 20 MG/ML IV SOLN
INTRAVENOUS | Status: DC | PRN
  Administered 2015-07-14: 80 mg via INTRAVENOUS

## 2015-07-14 MED ORDER — PHENYLEPHRINE HCL 10 MG/ML IJ SOLN
INTRAMUSCULAR | Status: DC | PRN
  Administered 2015-07-14 (×2): 200 ug via INTRAVENOUS
  Administered 2015-07-14 (×2): 100 ug via INTRAVENOUS

## 2015-07-14 MED ORDER — BUPIVACAINE HCL (PF) 0.5 % IJ SOLN
INTRAMUSCULAR | Status: AC
  Filled 2015-07-14: qty 30

## 2015-07-14 MED ORDER — BUPIVACAINE HCL (PF) 0.5 % IJ SOLN
INTRAMUSCULAR | Status: DC | PRN
  Administered 2015-07-14: 17 mL

## 2015-07-14 MED ORDER — ONDANSETRON HCL 4 MG/2ML IJ SOLN
INTRAMUSCULAR | Status: DC | PRN
  Administered 2015-07-14: 4 mg via INTRAVENOUS

## 2015-07-14 MED ORDER — CEFAZOLIN SODIUM-DEXTROSE 2-3 GM-% IV SOLR
2.0000 g | INTRAVENOUS | Status: AC
  Administered 2015-07-14: 2 g via INTRAVENOUS

## 2015-07-14 MED ORDER — FENTANYL CITRATE (PF) 100 MCG/2ML IJ SOLN
INTRAMUSCULAR | Status: DC | PRN
  Administered 2015-07-14 (×2): 50 ug via INTRAVENOUS

## 2015-07-14 MED ORDER — CHLORHEXIDINE GLUCONATE 4 % EX LIQD: 1.0000 "application " | Freq: Once | CUTANEOUS | Status: DC

## 2015-07-14 MED ORDER — MIDAZOLAM HCL 2 MG/2ML IJ SOLN
INTRAMUSCULAR | Status: DC | PRN
  Administered 2015-07-14: 2 mg via INTRAVENOUS

## 2015-07-14 SURGICAL SUPPLY — 37 items
BLADE SURG 15 STRL SS SAFETY (BLADE) ×6 IMPLANT
BULB RESERV EVAC DRAIN JP 100C (MISCELLANEOUS) ×1 IMPLANT
CANISTER SUCT 1200ML W/VALVE (MISCELLANEOUS) ×3 IMPLANT
CHLORAPREP W/TINT 26ML (MISCELLANEOUS) ×3 IMPLANT
CLOSURE WOUND 1/2 X4 (GAUZE/BANDAGES/DRESSINGS)
CNTNR SPEC 2.5X3XGRAD LEK (MISCELLANEOUS) ×1
CONT SPEC 4OZ STER OR WHT (MISCELLANEOUS) ×2
CONT SPEC 4OZ STRL OR WHT (MISCELLANEOUS) ×1
CONTAINER SPEC 2.5X3XGRAD LEK (MISCELLANEOUS) ×4 IMPLANT
COVER LIGHT HANDLE STERIS (MISCELLANEOUS) ×2 IMPLANT
COVER PROBE FLX POLY STRL (MISCELLANEOUS) ×3 IMPLANT
DEVICE LOCALIZATION ULTRAWIRE (WIRE) ×1 IMPLANT
DRAIN CHANNEL JP 15F RND 16 (MISCELLANEOUS) ×1 IMPLANT
DRAPE LAPAROTOMY TRNSV 106X77 (MISCELLANEOUS) ×3 IMPLANT
GLOVE BIO SURGEON STRL SZ7 (GLOVE) ×7 IMPLANT
GOWN STRL REUS W/ TWL LRG LVL3 (GOWN DISPOSABLE) ×3 IMPLANT
GOWN STRL REUS W/TWL LRG LVL3 (GOWN DISPOSABLE) ×6
HARMONIC SCALPEL FOCUS (MISCELLANEOUS) ×1 IMPLANT
KIT RM TURNOVER STRD PROC AR (KITS) ×3 IMPLANT
LABEL OR SOLS (LABEL) ×3 IMPLANT
LIQUID BAND (GAUZE/BANDAGES/DRESSINGS) ×3 IMPLANT
MARGIN MAP 10MM (MISCELLANEOUS) ×3 IMPLANT
NDL HYPO 25X1 1.5 SAFETY (NEEDLE) ×1 IMPLANT
NDL SAFETY 22GX1.5 (NEEDLE) ×3 IMPLANT
NEEDLE HYPO 25X1 1.5 SAFETY (NEEDLE) ×3 IMPLANT
PACK BASIN MINOR ARMC (MISCELLANEOUS) ×3 IMPLANT
PAD GROUND ADULT SPLIT (MISCELLANEOUS) ×3 IMPLANT
STRIP CLOSURE SKIN 1/2X4 (GAUZE/BANDAGES/DRESSINGS) ×1 IMPLANT
SUT ETH BLK MONO 3 0 FS 1 12/B (SUTURE) ×6 IMPLANT
SUT MNCRL AB 3-0 PS2 27 (SUTURE) ×3 IMPLANT
SUT VIC AB 2-0 BRD 54 (SUTURE) ×3 IMPLANT
SUT VIC AB 2-0 CT1 27 (SUTURE) ×3
SUT VIC AB 2-0 CT1 TAPERPNT 27 (SUTURE) IMPLANT
SUT VIC AB 2-0 CT2 27 (SUTURE) ×6 IMPLANT
SYRINGE 10CC LL (SYRINGE) ×3 IMPLANT
ULTRAWIRE LOCALIZATION DEVICE (WIRE)
WATER STERILE IRR 1000ML POUR (IV SOLUTION) ×3 IMPLANT

## 2015-07-14 NOTE — Anesthesia Postprocedure Evaluation (Signed)
Anesthesia Post Note  Patient: Allison Cooke  Procedure(s) Performed: Procedure(s) (LRB): BREAST LUMPECTOMY (Left)  Patient location during evaluation: PACU Anesthesia Type: General Level of consciousness: awake and alert Pain management: satisfactory to patient Vital Signs Assessment: post-procedure vital signs reviewed and stable Respiratory status: nonlabored ventilation Cardiovascular status: stable Anesthetic complications: no    Last Vitals:  Filed Vitals:   07/14/15 0609 07/14/15 0840  BP: 124/79 151/90  Pulse: 90 88  Temp: 36.3 C 36.8 C  Resp: 16 18    Last Pain:  Filed Vitals:   07/14/15 0902  PainSc: 6                  VAN STAVEREN,Hila Bolding

## 2015-07-14 NOTE — Transfer of Care (Signed)
Immediate Anesthesia Transfer of Care Note  Patient: Allison Cooke  Procedure(s) Performed: Procedure(s): BREAST LUMPECTOMY (Left)  Patient Location: PACU  Anesthesia Type:General  Level of Consciousness: awake, alert , oriented and patient cooperative  Airway & Oxygen Therapy: Patient Spontanous Breathing and Patient connected to face mask oxygen  Post-op Assessment: Report given to RN, Post -op Vital signs reviewed and stable and Patient moving all extremities X 4  Post vital signs: Reviewed and stable  Last Vitals:  Filed Vitals:   07/14/15 0609  BP: 124/79  Pulse: 90  Temp: 36.3 C  Resp: 16    Complications: No apparent anesthesia complications

## 2015-07-14 NOTE — Anesthesia Preprocedure Evaluation (Signed)
Anesthesia Evaluation  Patient identified by MRN, date of birth, ID band Patient awake    Reviewed: Allergy & Precautions, NPO status , Patient's Chart, lab work & pertinent test results  History of Anesthesia Complications Negative for: history of anesthetic complications  Airway Mallampati: II       Dental no notable dental hx.    Pulmonary shortness of breath and with exertion, COPD, Current Smoker,     + decreased breath sounds      Cardiovascular hypertension, Pt. on medications  Rhythm:Regular Rate:Normal     Neuro/Psych Depression    GI/Hepatic negative GI ROS, Neg liver ROS,   Endo/Other  diabetes, Well Controlled, Type 1, Insulin Dependent  Renal/GU negative Renal ROS     Musculoskeletal   Abdominal Normal abdominal exam  (+)   Peds  Hematology  (+) anemia ,   Anesthesia Other Findings   Reproductive/Obstetrics                             Anesthesia Physical Anesthesia Plan  ASA: III  Anesthesia Plan: General   Post-op Pain Management:    Induction: Intravenous  Airway Management Planned: LMA  Additional Equipment:   Intra-op Plan:   Post-operative Plan: Extubation in OR  Informed Consent: I have reviewed the patients History and Physical, chart, labs and discussed the procedure including the risks, benefits and alternatives for the proposed anesthesia with the patient or authorized representative who has indicated his/her understanding and acceptance.     Plan Discussed with: CRNA  Anesthesia Plan Comments:         Anesthesia Quick Evaluation

## 2015-07-14 NOTE — Interval H&P Note (Signed)
History and Physical Interval Note:  07/14/2015 7:10 AM  Allison Cooke  has presented today for surgery, with the diagnosis of LEFT BREAST MASS,NIPPLE DISCHARGE  The various methods of treatment have been discussed with the patient and family. After consideration of risks, benefits and other options for treatment, the patient has consented to  Procedure(s): BREAST LUMPECTOMY (Left) as a surgical intervention .  The patient's history has been reviewed, patient examined, no change in status, stable for surgery.  I have reviewed the patient's chart and labs.  Questions were answered to the patient's satisfaction.     SANKAR,SEEPLAPUTHUR G

## 2015-07-14 NOTE — Op Note (Signed)
Preop diagnosis: Persistent bloody discharge left nipple  Post op diagnosis: Same  Operation: Left breast lumpectomy  Surgeon: S.G.Dvante Hands  Assistant:     Anesthesia: Gen.  Complications: None  EBL: 25 mL  Drains: None  Description: Patient was placed the supine position the operating table and put to sleep with an LMA. Timeout was performed. The left breast area was prepped and draped sterile field. Patient had previous cytology on the nipple drainage which was visible bloody and at times watery. Initiating that had shown some atypia clinic core biopsy revealed only fibrocystic changes. The patient persisted with the stopper drainage and on repeat evaluation was noted to have multiple large dilated ducts with some the echodensities was some other ducts closed the nipple extending from the nipple outward over a 5-6 cm distance and located between 3 and 4:00 location. Ultrasound probe was brought to the field and the extent of these ductal dilatation was mapped out on the skin. A radial incision was made from the areolar margin outward between 3 and 4:00 location. Skin and subcutaneous tissue with an elevated on both sides and towards the nipple and laterally. The involved breast tissue containing the dilated ducts were extremely firm in consistency and by finger palpation and by the previous extent marked excision of this heart: Area right up to the nipple was performed. Cautery was used to control bleeding. The excised tissue measured approximately 6-7 cm by about 4-5 cm and was tagged for margins and sent to pathology. After ensuring hemostasis the wound was irrigated and closed with 2 layers of 2-0 Vicryl in the deep tissue. Skin was then approximated with subcuticular 3-0 Monocryl and covered with liqui  Ban. About 15 mL of half percent Marcaine was also used in the subcutaneous tissue for postop analgesia. Patient subsequently was returned recovery room stable condition.

## 2015-07-14 NOTE — Discharge Instructions (Signed)
AMBULATORY SURGERY  DISCHARGE INSTRUCTIONS   1) The drugs that you were given will stay in your system until tomorrow so for the next 24 hours you should not:  A) Drive an automobile B) Make any legal decisions C) Drink any alcoholic beverage   2) You may resume regular meals tomorrow.  Today it is better to start with liquids and gradually work up to solid foods.  You may eat anything you prefer, but it is better to start with liquids, then soup and crackers, and gradually work up to solid foods.   3) Please notify your doctor immediately if you have any unusual bleeding, trouble breathing, redness and pain at the surgery site, drainage, fever, or pain not relieved by medication.    4) Additional Instructions:        Please contact your physician with any problems or Same Day Surgery at [phone], Monday through Friday 6 am to 4 pm, or [place] at [facility] number at [phone]. Breast Biopsy, Care After These instructions give you information on caring for yourself after your procedure. Your doctor may also give you more specific instructions. Call your doctor if you have any problems or questions after your procedure. HOME CARE  Only take medicine as told by your doctor.  Do not take aspirin.  Keep your sutures (stitches) dry when bathing.  Protect the biopsy area. Do not let the area get bumped.  Avoid activities that could pull the biopsy site open until your doctor approves. This includes:  Stretching.  Reaching.  Exercise.  Sports.  Lifting more than 3lb.  Continue your normal diet.  Wear a good support bra for as long as told by your doctor.  Change any bandages (dressings) as told by your doctor.  Do not drink alcohol while taking pain medicine.  Keep all doctor visits as told. Ask when your test results will be ready. Make sure you get your test results. GET HELP RIGHT AWAY IF:   You have a fever.  You have more bleeding (more  than a small spot) from the biopsy site.  You have trouble breathing.  You have yellowish-white fluid (pus) coming from the biopsy site.  You have redness, puffiness (swelling), or more pain in the biopsy site.  You have a bad smell coming from the biopsy site.  Your biopsy site opens after sutures, staples, or sticky strips have been removed.  You have a rash.  You need stronger medicine. MAKE SURE YOU:  Understand these instructions.  Will watch your condition.  Will get help right away if you are not doing well or get worse.   This information is not intended to replace advice given to you by your health care provider. Make sure you discuss any questions you have with your health care provider.   Document Released: 05/19/2009 Document Revised: 08/13/2014 Document Reviewed: 09/02/2011 Elsevier Interactive Patient Education 2016 [employer]  Breast Biopsy, Care After These instructions give you information on caring for yourself after your procedure. Your doctor may also give you more specific instructions. Call your doctor if you have any problems or questions after your procedure. HOME CARE  Only take medicine as told by your doctor.  Do not take aspirin.  Keep your sutures (stitches) dry when bathing.  Protect the biopsy area. Do not let the area get bumped.  Avoid activities that could pull the biopsy site open until your doctor approves. This includes:  Stretching.  Reaching.  Exercise.  Sports.  Lifting  more than 3lb.  Continue your normal diet.  Wear a good support bra for as long as told by your doctor.  Change any bandages (dressings) as told by your doctor.  Do not drink alcohol while taking pain medicine.  Keep all doctor visits as told. Ask when your test results will be ready. Make sure you get your test results. GET HELP RIGHT AWAY IF:   You have a fever.  You have more bleeding (more than a small spot) from the biopsy site.  You  have trouble breathing.  You have yellowish-white fluid (pus) coming from the biopsy site.  You have redness, puffiness (swelling), or more pain in the biopsy site.  You have a bad smell coming from the biopsy site.  Your biopsy site opens after sutures, staples, or sticky strips have been removed.  You have a rash.  You need stronger medicine. MAKE SURE YOU:  Understand these instructions.  Will watch your condition.  Will get help right away if you are not doing well or get worse.   This information is not intended to replace advice given to you by your health care provider. Make sure you discuss any questions you have with your health care provider.   Document Released: 05/19/2009 Document Revised: 08/13/2014 Document Reviewed: 09/02/2011 Elsevier Interactive Patient Education 2016 [employer].  Breast Biopsy, Care After These instructions give you information on caring for yourself after your procedure. Your doctor may also give you more specific instructions. Call your doctor if you have any problems or questions after your procedure. HOME CARE  Only take medicine as told by your doctor.  Do not take aspirin.  Keep your sutures (stitches) dry when bathing.  Protect the biopsy area. Do not let the area get bumped.  Avoid activities that could pull the biopsy site open until your doctor approves. This includes:  Stretching.  Reaching.  Exercise.  Sports.  Lifting more than 3lb.  Continue your normal diet.  Wear a good support bra for as long as told by your doctor.  Change any bandages (dressings) as told by your doctor.  Do not drink alcohol while taking pain medicine.  Keep all doctor visits as told. Ask when your test results will be ready. Make sure you get your test results. GET HELP RIGHT AWAY IF:   You have a fever.  You have more bleeding (more than a small spot) from the biopsy site.  You have trouble breathing.  You have  yellowish-white fluid (pus) coming from the biopsy site.  You have redness, puffiness (swelling), or more pain in the biopsy site.  You have a bad smell coming from the biopsy site.  Your biopsy site opens after sutures, staples, or sticky strips have been removed.  You have a rash.  You need stronger medicine. MAKE SURE YOU:  Understand these instructions.  Will watch your condition.  Will get help right away if you are not doing well or get worse.   This information is not intended to replace advice given to you by your health care provider. Make sure you discuss any questions you have with your health care provider.   Document Released: 05/19/2009 Document Revised: 08/13/2014 Document Reviewed: 09/02/2011 Elsevier Interactive Patient Education 2016 Elsevier Inc.  Breast Biopsy, Care After These instructions give you information on caring for yourself after your procedure. Your doctor may also give you more specific instructions. Call your doctor if you have any problems or questions after your procedure. HOME CARE  Only take medicine as told by your doctor.  Do not take aspirin.  Keep your sutures (stitches) dry when bathing.  Protect the biopsy area. Do not let the area get bumped.  Avoid activities that could pull the biopsy site open until your doctor approves. This includes:  Stretching.  Reaching.  Exercise.  Sports.  Lifting more than 3lb.  Continue your normal diet.  Wear a good support bra for as long as told by your doctor.  Change any bandages (dressings) as told by your doctor.  Do not drink alcohol while taking pain medicine.  Keep all doctor visits as told. Ask when your test results will be ready. Make sure you get your test results. GET HELP RIGHT AWAY IF:   You have a fever.  You have more bleeding (more than a small spot) from the biopsy site.  You have trouble breathing.  You have yellowish-white fluid (pus) coming from the biopsy  site.  You have redness, puffiness (swelling), or more pain in the biopsy site.  You have a bad smell coming from the biopsy site.  Your biopsy site opens after sutures, staples, or sticky strips have been removed.  You have a rash.  You need stronger medicine. MAKE SURE YOU:  Understand these instructions.  Will watch your condition.  Will get help right away if you are not doing well or get worse.   This information is not intended to replace advice given to you by your health care provider. Make sure you discuss any questions you have with your health care provider.   Document Released: 05/19/2009 Document Revised: 08/13/2014 Document Reviewed: 09/02/2011 Elsevier Interactive Patient Education 2016 Else[employer].  Breast Biopsy, Care After These instructions give you information on caring for yourself after your procedure. Your doctor may also give you more specific instructions. Call your doctor if you have any problems or questions after your procedure. HOME CARE  Only take medicine as told by your doctor.  Do not take aspirin.  Keep your sutures (stitches) dry when bathing.  Protect the biopsy area. Do not let the area get bumped.  Avoid activities that could pull the biopsy site open until your doctor approves. This includes:  Stretching.  Reaching.  Exercise.  Sports.  Lifting more than 3lb.  Continue your normal diet.  Wear a good support bra for as long as told by your doctor.  Change any bandages (dressings) as told by your doctor.  Do not drink alcohol while taking pain medicine.  Keep all doctor visits as told. Ask when your test results will be ready. Make sure you get your test results. GET HELP RIGHT AWAY IF:   You have a fever.  You have more bleeding (more than a small spot) from the biopsy site.  You have trouble breathing.  You have yellowish-white fluid (pus) coming from the biopsy site.  You have redness, puffiness (swelling),  or more pain in the biopsy site.  You have a bad smell coming from the biopsy site.  Your biopsy site opens after sutures, staples, or sticky strips have been removed.  You have a rash.  You need stronger medicine. MAKE SURE YOU:  Understand these instructions.  Will watch your condition.  Will get help right away if you are not doing well or get worse.   This information is not intended to replace advice given to you by your health care provider. Make sure you discuss any questions you have with your health care provider.  Document Released: 05/19/2009 Document Revised: 08/13/2014 Document Reviewed: 09/02/2011 Elsevier Interactive Patient Education [employer].

## 2015-07-14 NOTE — Anesthesia Procedure Notes (Signed)
Procedure Name: LMA Insertion Date/Time: 07/14/2015 7:29 AM Performed by: Silvana Newness Pre-anesthesia Checklist: Patient identified, Emergency Drugs available, Suction available, Patient being monitored and Timeout performed Patient Re-evaluated:Patient Re-evaluated prior to inductionOxygen Delivery Method: Circle system utilized Preoxygenation: Pre-oxygenation with 100% oxygen Intubation Type: IV induction Ventilation: Mask ventilation without difficulty LMA: LMA inserted LMA Size: 3.5 Number of attempts: 1 Placement Confirmation: positive ETCO2 and breath sounds checked- equal and bilateral Tube secured with: Tape Dental Injury: Teeth and Oropharynx as per pre-operative assessment

## 2015-07-14 NOTE — H&P (View-Only) (Signed)
Patient ID: Allison Cooke, female   DOB: Dec 02, 1960, 54 y.o.   MRN: [id number]  Chief Complaint  Patient presents with  . Follow-up    HPI Allison Cooke is a 54 y.o. female.  Here today for follow up left nipple drainage. She states it is about the same. She notices the blood tinged drainage about every other day. No breast pain. She was evaluated for this in August with finding of dilated ducts over large area lateral to the nipple. Core biopsy was performed showing fibrocystic changes and no atypia. The patient had not followed up after due to other health issues. Since that time, she has been placed on insulin. Also, one week ago she fell and injured right knee. I have reviewed the history of present illness with the patient. HPI  Past Medical History  Diagnosis Date  . Hypertension   . Pre-diabetes   . SOB (shortness of breath)   . Heart murmur   . Headache   . Arthritis   . Anemia   . Seizures ([place])     as a child  . Diabetes mellitus without complication [facility])     Past Surgical History  Procedure Laterality Date  . Cholecystectomy    . Cesarean section    . Fine needle aspiration Left     breast cyst, benign, age 38?  Marland Kitchen Breast surgery Left   . Direct laryngoscopy N/A 04/26/2015    Procedure: Microlaryngoscopy with exxcision of vocal cord polyp ;  Surgeon: Beverly Gust, MD;  Location: [facility] ORS;  Service: ENT;  Laterality: N/A;    Family History  Problem Relation Age of Onset  . Hypertension Mother   . Heart disease Mother   . Hyperlipidemia Mother   . Diabetes Mother   . Heart disease Father   . Hyperlipidemia Father   . Hypertension Father   . Breast cancer Cousin   . Diabetes Sister   . Hypertension Daughter   . Diabetes Maternal Grandmother   . Diabetes Sister     Social History Social History  Substance Use Topics  . Smoking status: Current Some Day Smoker -- 0.25 packs/day for 20 years    Types: Cigarettes  . Smokeless tobacco: Never Used  .  Alcohol Use: No    Allergies  Allergen Reactions  . Shrimp [Shellfish Allergy] Hives and Itching    Current Outpatient Prescriptions  Medication Sig Dispense Refill  . amLODipine (NORVASC) 5 MG tablet Take 1 tablet (5 mg total) by mouth daily. 30 tablet 0  . B-D INS SYR ULTRAFINE 1CC/31G 31G X 5/16" 1 ML MISC use at bedtime as directed WITH LEVEMIR  0  . blood glucose meter kit and supplies KIT Dispense based on patient and insurance preference. Use up to four times daily as directed. (FOR ICD-9 250.00, 250.01). 1 each 0  . diphenhydrAMINE (BENADRYL) 25 mg capsule Take 25 mg by mouth every 4 (four) hours as needed.    Marland Kitchen glipiZIDE (GLUCOTROL XL) 2.5 MG 24 hr tablet Take 2 tablets (5 mg total) by mouth daily with breakfast. 60 tablet 0  . insulin detemir (LEVEMIR) 100 UNIT/ML injection Inject 0.2 mLs (20 Units total) into the skin at bedtime. 10 mL 1  . metFORMIN (GLUCOPHAGE) 500 MG tablet Take 1 tablet (500 mg total) by mouth 2 (two) times daily with a meal. 60 tablet 0  . sertraline (ZOLOFT) 100 MG tablet Take 1 tablet (100 mg total) by mouth daily. 30 tablet 3  . Syringe,  Disposable, 1 ML MISC 1 Syringe by Does not apply route at bedtime. To use to take levemir injection. 100 each 0   No current facility-administered medications for this visit.   Facility-Administered Medications Ordered in Other Visits  Medication Dose Route Frequency Provider Last Rate Last Dose  . albuterol (PROVENTIL) (2.5 MG/3ML) 0.083% nebulizer solution 2.5 mg  2.5 mg Nebulization Once Arnetha Courser, MD   2.5 mg at 03/08/15 1572    Review of Systems Review of Systems  Constitutional: Negative.   Respiratory: Negative.   Cardiovascular: Negative.   Neurological: Positive for headaches.    Blood pressure 120/70, pulse 70, resp. rate 14, height 5' 2"  (1.575 m), weight 166 lb (75.297 kg), last menstrual period 04/29/2015.  Physical Exam Physical Exam  Constitutional: She is oriented to person, place, and  time. She appears well-developed and well-nourished.  HENT:  Mouth/Throat: Oropharynx is clear and moist.  Eyes: Conjunctivae are normal. No scleral icterus.  Neck: Neck supple.  Cardiovascular: Normal rate, regular rhythm and normal heart sounds.   Pulmonary/Chest: Effort normal and breath sounds normal. Right breast exhibits no inverted nipple, no mass, no nipple discharge, no skin change and no tenderness. Left breast exhibits mass and nipple discharge. Left breast exhibits no inverted nipple, no skin change and no tenderness.    Left breast with mild fullness to palpation lateral to the nipple. Area is tender, and pressure here elicits dark red discharge from nipple. Area appears similar to previous exam.  Abdominal: Soft. Bowel sounds are normal.  Lymphadenopathy:    She has no cervical adenopathy.    She has no axillary adenopathy.  Neurological: She is alert and oriented to person, place, and time.  Skin: Skin is warm and dry.  Psychiatric: Her behavior is normal.    Data Reviewed Previous lab reports and progress notes. Mammogram was normal. Left breast ultrasound repeated today. Findings similar to what they were in August. There are multiple markedly dilated ducts and cystic spaces extending from the nipple along the 3-4 o'clock direction for over 5 cm. Close to the nipple, some ill-defined echodensities seen in some of the ducts.   Assessment    Left breast mass with bloody nipple discharge, prior cytology had shown some atypia but core biopsy only showed fibrocystic changes. At this point, excision of this area is indicated to rule out underlying malignancy.     Plan    Recommend excision of involved area. Procedure and risks discussed in full and patient agreeable.     Patient's surgery has been scheduled for 07-14-15 at [facility].  PCP:  Candace Cruise 07/07/2015, 11:58 AM

## 2015-07-15 ENCOUNTER — Telehealth: Payer: Self-pay | Admitting: Family Medicine

## 2015-07-15 LAB — SURGICAL PATHOLOGY

## 2015-07-15 MED ORDER — ATORVASTATIN CALCIUM 40 MG PO TABS: 40.0000 mg | ORAL_TABLET | Freq: Every day | ORAL | Status: DC

## 2015-07-15 NOTE — Telephone Encounter (Signed)
Patient had breast lumpectomy yesterday; please call and see how she's feeling Let her know her labs showed normal kidney function, and normal liver function Her cholesterol is high, so we'll want to start a medicine to help her lower that Start new pill, one at bedtime, and we'll recheck fasting cholesterol with other labs in late February when we check her A1C Try to stay away from bacon and sausage and cheese and eggs and hamburgers and hot dogs and bologna Get more fruits and vegetables and fiber If she develops abdominal pain or muscle aches on the medicine, stop it and call us Thank you

## 2015-07-15 NOTE — Telephone Encounter (Signed)
Patient notified

## 2015-07-19 ENCOUNTER — Telehealth: Payer: Self-pay | Admitting: *Deleted

## 2015-07-19 NOTE — Telephone Encounter (Signed)
-----   Message from Christene Lye, MD sent at 07/19/2015  9:24 AM EST ----- Rosann Auerbach, please let pt pt know the pathology was normal. Had a benign tumor and fibrocystic changes.

## 2015-07-19 NOTE — Telephone Encounter (Signed)
Notified patient as instructed, patient pleased. Discussed follow-up appointments, patient agrees  

## 2015-07-27 ENCOUNTER — Encounter: Payer: Self-pay | Admitting: *Deleted

## 2015-07-27 ENCOUNTER — Ambulatory Visit (INDEPENDENT_AMBULATORY_CARE_PROVIDER_SITE_OTHER): Payer: Medicaid Other | Admitting: General Surgery

## 2015-07-27 ENCOUNTER — Encounter: Payer: Self-pay | Admitting: General Surgery

## 2015-07-27 VITALS — BP 122/78 | HR 74 | Resp 14 | Ht 62.0 in | Wt 164.0 lb

## 2015-07-27 DIAGNOSIS — D242 Benign neoplasm of left breast: Secondary | ICD-10-CM

## 2015-07-27 DIAGNOSIS — N6012 Diffuse cystic mastopathy of left breast: Secondary | ICD-10-CM

## 2015-07-27 DIAGNOSIS — Z1239 Encounter for other screening for malignant neoplasm of breast: Secondary | ICD-10-CM

## 2015-07-27 NOTE — Patient Instructions (Addendum)
Patient to return in two months bilateral screening mammogram

## 2015-07-27 NOTE — Progress Notes (Signed)
This is a 54 year old female here today for her post op left lumpectomy done on 128/16. Patient states she is doing well.  I have reviewed the history of present illness with the patient.   The left lumpectomy site looks clean and healing well. No signs of infection/seroma. Path- papilloma, FC changes. Pt advised on this.  Patient to return in two months bilateral screening mammogram

## 2015-08-03 ENCOUNTER — Encounter: Payer: Self-pay | Admitting: Family Medicine

## 2015-08-03 ENCOUNTER — Ambulatory Visit (INDEPENDENT_AMBULATORY_CARE_PROVIDER_SITE_OTHER): Payer: Medicaid Other | Admitting: Family Medicine

## 2015-08-03 VITALS — BP 121/85 | HR 81 | Temp 98.3°F | Wt 164.0 lb

## 2015-08-03 DIAGNOSIS — E1165 Type 2 diabetes mellitus with hyperglycemia: Secondary | ICD-10-CM

## 2015-08-03 DIAGNOSIS — Z794 Long term (current) use of insulin: Secondary | ICD-10-CM

## 2015-08-03 DIAGNOSIS — I10 Essential (primary) hypertension: Secondary | ICD-10-CM | POA: Diagnosis not present

## 2015-08-03 DIAGNOSIS — M25561 Pain in right knee: Secondary | ICD-10-CM | POA: Diagnosis not present

## 2015-08-03 DIAGNOSIS — F331 Major depressive disorder, recurrent, moderate: Secondary | ICD-10-CM

## 2015-08-03 DIAGNOSIS — Z720 Tobacco use: Secondary | ICD-10-CM

## 2015-08-03 MED ORDER — TRAMADOL HCL 50 MG PO TABS: 50.0000 mg | ORAL_TABLET | Freq: Four times a day (QID) | ORAL | Status: DC | PRN

## 2015-08-03 NOTE — Progress Notes (Signed)
BP 121/85 mmHg  Pulse 81  Temp(Src) 98.3 F (36.8 C)  Wt 164 lb (74.39 kg)  SpO2 100%   Subjective:    Patient ID: Allison Cooke, female    DOB: 1960/11/28, 53 y.o.   MRN: [id number]  HPI: Allison Cooke is a 54 y.o. female  Chief Complaint  Patient presents with  . Depression    follow up, increased Sertraline to 150mg . Doing much better.  . Medication Refill    she also needs refills on Metformin, Amlodipine, and Glipizide   She says she is feeling a lot better than she did; she wants to stay on the medicine; sertraline  Depression screen PHQ 2/9 08/03/2015  Decreased Interest 1  Down, Depressed, Hopeless 1  PHQ - 2 Score 2  Altered sleeping 3  Tired, decreased energy 1  Change in appetite 1  Feeling bad or failure about yourself  1  Trouble concentrating 1  Moving slowly or fidgety/restless 1  Suicidal thoughts 0  PHQ-9 Score 10  Difficult doing work/chores Very difficult   Since last visit, had breast biopsy and there was no cancer; she saw the surgeon last week; she goes back for a mammogram in February 2017; still having a little pain at the site, but he says it's healing good  High cholesterol; no problems with statin; no muscle aches; trying to watch a good diet; drinks 2% milk; she thinks she could back to 1% milk when I asked and we discussed diet; she does not eat much cheese  Labs from Jul 11, 2015: Cholesterol, Total 100 - 199 mg/dL 257 (H)   Triglycerides 0 - 149 mg/dL 133   HDL >39 mg/dL 71   VLDL Cholesterol Cal 5 - 40 mg/dL 27   LDL Calculated 0 - 99 mg/dL 159 (H)        Diabetes; sugars are up and down; might be 120 or 150 or 170 or 200; one day it was 255, but mostly in the 120-200 range; does have dry mouth; vision is getting better, it had been pretty blurry  Last A1c Jun 29, 2015 [facility] DCA Hb A1c Waived <7.0 % 13.8 (H)       Still having sharp pains in her foot; walking on her right knee better, but there is still pain; right knee  injury with the fall in November; wore a brace for a while, and still wears it when going out; not wearing it today, though; the brace helps; gets stiff, but does not lock; knee will buckle at times EXAM: RIGHT KNEE - COMPLETE 4+ VIEW  COMPARISON: None.  FINDINGS: No convincing acute fracture. There is a well corticated bone fragment along the medial margin of the medial femoral condyle. This has a chronic appearance with no associated soft tissue swelling or joint effusion. It may reflect an accessory ossification center.  Knee joint is normally spaced and aligned. No joint effusion. No arthropathic change.  IMPRESSION: No acute fracture or dislocation. No joint effusion.  Relevant past medical, surgical, family and social history reviewed and updated as indicated. Interim medical history since our last visit reviewed. Allergies and medications reviewed and updated.  Review of Systems Per HPI unless specifically indicated above     Objective:    BP 121/85 mmHg  Pulse 81  Temp(Src) 98.3 F (36.8 C)  Wt 164 lb (74.39 kg)  SpO2 100%  Wt Readings from Last 3 Encounters:  08/03/15 164 lb (74.39 kg)  07/27/15 164 lb (74.39 kg)  07/14/15 166 lb (75.297 kg)    Physical Exam  Constitutional: She appears well-developed and well-nourished. No distress.  HENT:  Head: Normocephalic and atraumatic.  Eyes: EOM are normal. No scleral icterus.  Neck: No thyromegaly present.  Cardiovascular: Normal rate, regular rhythm and normal heart sounds.   No murmur heard. Pulmonary/Chest: Effort normal and breath sounds normal. No respiratory distress. She has no wheezes.  Abdominal: Soft. Bowel sounds are normal. She exhibits no distension.  Musculoskeletal: She exhibits no edema.       Right knee: She exhibits decreased range of motion and swelling (anteromedially). She exhibits no effusion. Tenderness found.  Neurological: She is alert. She exhibits normal muscle tone.  Skin: Skin is  warm and dry. She is not diaphoretic. No pallor.  Psychiatric: She has a normal mood and affect. Her behavior is normal. Judgment and thought content normal.   Diabetic Foot Form - Detailed   Diabetic Foot Exam - detailed  Diabetic Foot exam was performed with the following findings:  Yes 08/03/2015 10:47 AM  Visual Foot Exam completed.:  Yes  Is there a history of foot ulcer?:  No  Are the toenails long?:  No  Are the toenails thick?:  No  Are the toenails ingrown?:  No    Pulse Foot Exam completed.:  Yes  Right Dorsalis Pedis:  Present Left Dorsalis Pedis:  Present  Sensory Foot Exam Completed.:  Yes  Semmes-Weinstein Monofilament Test  R Site 1-Great Toe:  Pos L Site 1-Great Toe:  Pos  R Site 4:  Pos L Site 4:  Pos  R Site 5:  Pos L Site 5:  Pos       Breast biopsy, partial section of path report copied and pasted: DIAGNOSIS: A. LEFT BREAST, 3 TO 4:00; LUMPECTOMY: - SCLEROSING INTRADUCTAL PAPILLOMA, EXCISED (1.1 CM). - FIBROCYSTIC CHANGE AND INFLAMMATION, SEE NOTE. - NEGATIVE FOR ATYPIA AND MALIGNANCY.     Assessment & Plan:   Problem List Items Addressed This Visit      Cardiovascular and Mediastinum   Essential hypertension, benign    Well-controlled, continue DASH guidelines, lisinopril and amlodipine        Endocrine   Type 2 diabetes mellitus (HCC)    Continue current regimen; sugars sound to be under much better control; next A1c during on or just after 10/10/15; foot exam by MD today, patient to check feet regularly and notify me of any sores or problems        Other   Depression, major, recurrent ([place])    Continue SSRI; supportive listening; call if symptoms worsening      Tobacco abuse    I encouraged patient to quit smoking; see AVS for tips for success; I am here to help if and when the day comes that she is ready to try to stop smoking      Right anterior knee pain - Primary    Patient fell in November, still with patient; refer to orthopaedist;  refill of tramadol given today      Relevant Orders   Ambulatory referral to Orthopedic Surgery      Follow up plan: Return in about 2 months (around 10/10/2015), or or just after, for thirty minute follow-up with fasting labs.  An after-visit summary was printed and given to the patient at [facility].  Please see the patient instructions which may contain other information and recommendations beyond what is mentioned above in the assessment and plan. Orders Placed This Encounter  Procedures  .  Ambulatory referral to Orthopedic Surgery   Meds ordered this encounter  Medications  . traMADol (ULTRAM) 50 MG tablet    Sig: Take 1 tablet (50 mg total) by mouth every 6 (six) hours as needed.    Dispense:  30 tablet    Refill:  0

## 2015-08-03 NOTE — Patient Instructions (Addendum)
We'll get you to the orthopaedist Do wear the brace Use the tramadol if needed Keep checking sugars, try to check 3x a day and bring some of those readings to your next appointment Call me before next appointment if sugars are starting to drop so we can reduce your medicine Return in March for fasting labs and your next check-up I really encourage you to quit smoking You can sign up for free smoking cessation classes if you'd like by calling [phone] Avoid flavored vapor or e-cigarettes, because they can cause popcorn lung  Smoking Cessation, Tips for Success If you are ready to quit smoking, congratulations! You have chosen to help yourself be healthier. Cigarettes bring nicotine, tar, carbon monoxide, and other irritants into your body. Your lungs, heart, and blood vessels will be able to work better without these poisons. There are many different ways to quit smoking. Nicotine gum, nicotine patches, a nicotine inhaler, or nicotine nasal spray can help with physical craving. Hypnosis, support groups, and medicines help break the habit of smoking. WHAT THINGS CAN I DO TO MAKE QUITTING EASIER?  Here are some tips to help you quit for good:  Pick a date when you will quit smoking completely. Tell all of your friends and family about your plan to quit on that date.  Do not try to slowly cut down on the number of cigarettes you are smoking. Pick a quit date and quit smoking completely starting on that day.  Throw away all cigarettes.   Clean and remove all ashtrays from your home, work, and car.  On a card, write down your reasons for quitting. Carry the card with you and read it when you get the urge to smoke.  Cleanse your body of nicotine. Drink enough water and fluids to keep your urine clear or pale yellow. Do this after quitting to flush the nicotine from your body.  Learn to predict your moods. Do not let a bad situation be your excuse to have a cigarette. Some situations in your  life might tempt you into wanting a cigarette.  Never have "just one" cigarette. It leads to wanting another and another. Remind yourself of your decision to quit.  Change habits associated with smoking. If you smoked while driving or when feeling stressed, try other activities to replace smoking. Stand up when drinking your coffee. Brush your teeth after eating. Sit in a different chair when you read the paper. Avoid alcohol while trying to quit, and try to drink fewer caffeinated beverages. Alcohol and caffeine may urge you to smoke.  Avoid foods and drinks that can trigger a desire to smoke, such as sugary or spicy foods and alcohol.  Ask people who smoke not to smoke around you.  Have something planned to do right after eating or having a cup of coffee. For example, plan to take a walk or exercise.  Try a relaxation exercise to calm you down and decrease your stress. Remember, you may be tense and nervous for the first 2 weeks after you quit, but this will pass.  Find new activities to keep your hands busy. Play with a pen, coin, or rubber band. Doodle or draw things on paper.  Brush your teeth right after eating. This will help cut down on the craving for the taste of tobacco after meals. You can also try mouthwash.   Use oral substitutes in place of cigarettes. Try using lemon drops, carrots, cinnamon sticks, or chewing gum. Keep them handy so they are  available when you have the urge to smoke.  When you have the urge to smoke, try deep breathing.  Designate your home as a nonsmoking area.  If you are a heavy smoker, ask your health care provider about a prescription for nicotine chewing gum. It can ease your withdrawal from nicotine.  Reward yourself. Set aside the cigarette money you save and buy yourself something nice.  Look for support from others. Join a support group or smoking cessation program. Ask someone at home or at work to help you with your plan to quit  smoking.  Always ask yourself, "Do I need this cigarette or is this just a reflex?" Tell yourself, "Today, I choose not to smoke," or "I do not want to smoke." You are reminding yourself of your decision to quit.  Do not replace cigarette smoking with electronic cigarettes (commonly called e-cigarettes). The safety of e-cigarettes is unknown, and some may contain harmful chemicals.  If you relapse, do not give up! Plan ahead and think about what you will do the next time you get the urge to smoke. HOW WILL I FEEL WHEN I QUIT SMOKING? You may have symptoms of withdrawal because your body is used to nicotine (the addictive substance in cigarettes). You may crave cigarettes, be irritable, feel very hungry, cough often, get headaches, or have difficulty concentrating. The withdrawal symptoms are only temporary. They are strongest when you first quit but will go away within 10-14 days. When withdrawal symptoms occur, stay in control. Think about your reasons for quitting. Remind yourself that these are signs that your body is healing and getting used to being without cigarettes. Remember that withdrawal symptoms are easier to treat than the major diseases that smoking can cause.  Even after the withdrawal is over, expect periodic urges to smoke. However, these cravings are generally short lived and will go away whether you smoke or not. Do not smoke! WHAT RESOURCES ARE AVAILABLE TO HELP ME QUIT SMOKING? Your health care provider can direct you to community resources or hospitals for support, which may include:  Group support.  Education.  Hypnosis.  Therapy.   This information is not intended to replace advice given to you by your health care provider. Make sure you discuss any questions you have with your health care provider.   Document Released: 04/20/2004 Document Revised: 08/13/2014 Document Reviewed: 01/08/2013 Elsevier Interactive Patient Education [employer].

## 2015-08-05 ENCOUNTER — Other Ambulatory Visit: Payer: Self-pay | Admitting: Family Medicine

## 2015-08-05 DIAGNOSIS — R739 Hyperglycemia, unspecified: Secondary | ICD-10-CM

## 2015-08-05 NOTE — Telephone Encounter (Signed)
Pt came by stated she needs refills on the following medications:  Amlodipine B D Ins syr ultrafine Diphenhydramine Glipizide Metformin Multivitamin Tramadol Lisinopril  Pharm is [facility]. Thanks.

## 2015-08-05 NOTE — Telephone Encounter (Signed)
Routing to provider. Tramadol rx already done and Lisinopril has refill.

## 2015-08-08 NOTE — Assessment & Plan Note (Signed)
Continue SSRI; supportive listening; call if symptoms worsening

## 2015-08-08 NOTE — Assessment & Plan Note (Signed)
Patient fell in November, still with patient; refer to orthopaedist; refill of tramadol given today

## 2015-08-08 NOTE — Assessment & Plan Note (Signed)
Well-controlled, continue DASH guidelines, lisinopril and amlodipine

## 2015-08-08 NOTE — Assessment & Plan Note (Signed)
I encouraged patient to quit smoking; see AVS for tips for success; I am here to help if and when the day comes that she is ready to try to stop smoking

## 2015-08-08 NOTE — Assessment & Plan Note (Addendum)
Continue current regimen; sugars sound to be under much better control; next A1c during on or just after 10/10/15; foot exam by MD today, patient to check feet regularly and notify me of any sores or problems

## 2015-08-09 ENCOUNTER — Telehealth: Payer: Self-pay

## 2015-08-09 ENCOUNTER — Telehealth: Payer: Self-pay | Admitting: Family Medicine

## 2015-08-09 MED ORDER — GLIPIZIDE ER 2.5 MG PO TB24: 5.0000 mg | ORAL_TABLET | Freq: Every day | ORAL | Status: DC

## 2015-08-09 MED ORDER — ONE-DAILY MULTI VITAMINS PO TABS: 1.0000 | ORAL_TABLET | Freq: Every day | ORAL | Status: DC

## 2015-08-09 MED ORDER — DIPHENHYDRAMINE HCL 25 MG PO CAPS: 25.0000 mg | ORAL_CAPSULE | Freq: Four times a day (QID) | ORAL | Status: DC | PRN

## 2015-08-09 MED ORDER — "BD INSULIN SYR ULTRAFINE II 31G X 5/16"" 1 ML MISC": Status: DC

## 2015-08-09 MED ORDER — AMLODIPINE BESYLATE 5 MG PO TABS: 5.0000 mg | ORAL_TABLET | Freq: Every day | ORAL | Status: DC

## 2015-08-09 MED ORDER — METFORMIN HCL 500 MG PO TABS: 500.0000 mg | ORAL_TABLET | Freq: Two times a day (BID) | ORAL | Status: DC

## 2015-08-09 NOTE — Telephone Encounter (Signed)
Patient states that she needs a refill for the needles that she uses to inject her insulin.  Patient states that the pharmacist says that she should have enough needles but patient says that the pharmacist is mistaken.  Please call patient to gather additional information and to clarify which needles she should have refilled.  Please call patient at [phone].   Pharmacy phone number is: [phone]

## 2015-08-09 NOTE — Telephone Encounter (Signed)
RX was refilled already this morning. Patient notified.

## 2015-08-09 NOTE — Telephone Encounter (Signed)
Patient need refill on her metFORMIN (GLUCOPHAGE) 500 MG tablet, and send to [facility]. Please call patient when it is ready.

## 2015-08-10 NOTE — Telephone Encounter (Signed)
Left message to call.

## 2015-08-11 DIAGNOSIS — N6452 Nipple discharge: Secondary | ICD-10-CM

## 2015-08-11 NOTE — Telephone Encounter (Signed)
Left message to call.

## 2015-08-16 NOTE — Telephone Encounter (Signed)
Left message to call.  Multiple messages left, no return calls.

## 2015-08-24 ENCOUNTER — Other Ambulatory Visit: Payer: Self-pay | Admitting: Orthopedic Surgery

## 2015-08-24 DIAGNOSIS — M25561 Pain in right knee: Secondary | ICD-10-CM

## 2015-09-07 ENCOUNTER — Other Ambulatory Visit: Payer: Self-pay

## 2015-09-07 DIAGNOSIS — Z5181 Encounter for therapeutic drug level monitoring: Secondary | ICD-10-CM

## 2015-09-07 MED ORDER — LISINOPRIL 10 MG PO TABS: 10.0000 mg | ORAL_TABLET | Freq: Every day | ORAL | Status: DC

## 2015-09-07 NOTE — Telephone Encounter (Signed)
I called patient; she is going to have a MRI of her knee soon Please do come by for a BMP tomorrow She is having depression; sees psychiatrist tomorrow; mobile crisis number number given; support provided; call 911 if thinking of hurting herself; she agrees

## 2015-09-07 NOTE — Assessment & Plan Note (Signed)
Check BMP tomorrow

## 2015-09-07 NOTE — Telephone Encounter (Signed)
Routing to provider  

## 2015-09-08 ENCOUNTER — Other Ambulatory Visit: Payer: Medicaid Other

## 2015-09-08 DIAGNOSIS — Z5181 Encounter for therapeutic drug level monitoring: Secondary | ICD-10-CM

## 2015-09-09 ENCOUNTER — Other Ambulatory Visit: Payer: Self-pay | Admitting: Family Medicine

## 2015-09-09 LAB — BASIC METABOLIC PANEL
BUN/Creatinine Ratio: 22 (ref 9–23)
BUN: 15 mg/dL (ref 6–24)
CALCIUM: 9.9 mg/dL (ref 8.7–10.2)
CO2: 22 mmol/L (ref 18–29)
CREATININE: 0.68 mg/dL (ref 0.57–1.00)
Chloride: 102 mmol/L (ref 96–106)
GFR, EST AFRICAN AMERICAN: 115 mL/min/{1.73_m2} (ref >59)
GFR, EST NON AFRICAN AMERICAN: 99 mL/min/{1.73_m2} (ref >59)
Glucose: 102 mg/dL — ABNORMAL HIGH (ref 65–99)
Potassium: 4.3 mmol/L (ref 3.5–5.2)
Sodium: 140 mmol/L (ref 134–144)

## 2015-09-09 NOTE — Telephone Encounter (Signed)
[facility]

## 2015-09-09 NOTE — Telephone Encounter (Signed)
Left message to call to see what insulin she needs.

## 2015-09-12 NOTE — Telephone Encounter (Signed)
Routing to provider  

## 2015-09-13 ENCOUNTER — Ambulatory Visit
Admission: RE | Admit: 2015-09-13 | Discharge: 2015-09-13 | Disposition: A | Discharge status: Home / Self Care | Payer: Medicaid Other | Source: Ambulatory Visit | Attending: Orthopedic Surgery | Admitting: Orthopedic Surgery

## 2015-09-13 DIAGNOSIS — M25561 Pain in right knee: Secondary | ICD-10-CM

## 2015-09-13 MED ORDER — INSULIN DETEMIR 100 UNIT/ML ~~LOC~~ SOLN: 22.0000 [IU] | SUBCUTANEOUS | Status: DC

## 2015-09-13 MED ORDER — SYRINGE (DISPOSABLE) 1 ML MISC: 1.0000 | Freq: Every day | Status: DC

## 2015-09-13 NOTE — Telephone Encounter (Signed)
Rx for insulin and needles sent

## 2015-09-21 ENCOUNTER — Ambulatory Visit: Payer: Medicaid Other | Admitting: General Surgery

## 2015-09-23 ENCOUNTER — Telehealth: Payer: Self-pay

## 2015-09-23 NOTE — Telephone Encounter (Signed)
Request for Accu-Check Aviva Plus test strips. Not sure where this Rx is called in [facility], and I don't want to order/re-order the wrong thing.   [facility]arm[facility] is [facility] Rx #: [id number]

## 2015-09-24 MED ORDER — GLUCOSE BLOOD VI STRP: ORAL_STRIP | Status: DC

## 2015-09-24 NOTE — Telephone Encounter (Signed)
Rx approved thank you

## 2015-10-03 ENCOUNTER — Ambulatory Visit: Payer: Medicaid Other | Admitting: General Surgery

## 2015-10-11 ENCOUNTER — Ambulatory Visit: Payer: Medicaid Other | Admitting: General Surgery

## 2015-10-20 ENCOUNTER — Other Ambulatory Visit: Payer: Self-pay

## 2015-10-20 MED ORDER — LISINOPRIL 10 MG PO TABS: 10.0000 mg | ORAL_TABLET | Freq: Every day | ORAL | Status: DC

## 2015-10-20 MED ORDER — ATORVASTATIN CALCIUM 40 MG PO TABS: 40.0000 mg | ORAL_TABLET | Freq: Every day | ORAL | Status: DC

## 2015-10-20 NOTE — Telephone Encounter (Signed)
Labs due at the end of the month; one month rx sent of each

## 2015-10-20 NOTE — Telephone Encounter (Signed)
Routing to provider  

## 2015-11-02 ENCOUNTER — Encounter: Payer: Self-pay | Admitting: Family Medicine

## 2015-11-02 ENCOUNTER — Ambulatory Visit (INDEPENDENT_AMBULATORY_CARE_PROVIDER_SITE_OTHER): Payer: Medicaid Other | Admitting: Family Medicine

## 2015-11-02 VITALS — BP 138/89 | HR 86 | Temp 97.9°F | Ht 64.0 in | Wt 184.0 lb

## 2015-11-02 DIAGNOSIS — Z794 Long term (current) use of insulin: Secondary | ICD-10-CM | POA: Diagnosis not present

## 2015-11-02 DIAGNOSIS — Z5181 Encounter for therapeutic drug level monitoring: Secondary | ICD-10-CM | POA: Diagnosis not present

## 2015-11-02 DIAGNOSIS — R490 Dysphonia: Secondary | ICD-10-CM | POA: Diagnosis not present

## 2015-11-02 DIAGNOSIS — M25561 Pain in right knee: Secondary | ICD-10-CM | POA: Diagnosis not present

## 2015-11-02 DIAGNOSIS — F331 Major depressive disorder, recurrent, moderate: Secondary | ICD-10-CM

## 2015-11-02 DIAGNOSIS — I10 Essential (primary) hypertension: Secondary | ICD-10-CM

## 2015-11-02 DIAGNOSIS — E1165 Type 2 diabetes mellitus with hyperglycemia: Secondary | ICD-10-CM | POA: Diagnosis not present

## 2015-11-02 MED ORDER — TRAMADOL HCL 50 MG PO TABS: 50.0000 mg | ORAL_TABLET | Freq: Four times a day (QID) | ORAL | Status: DC | PRN

## 2015-11-02 MED ORDER — SERTRALINE HCL 100 MG PO TABS: 200.0000 mg | ORAL_TABLET | Freq: Every day | ORAL | Status: DC

## 2015-11-02 NOTE — Assessment & Plan Note (Signed)
Limit salt

## 2015-11-02 NOTE — Progress Notes (Signed)
BP 138/89 mmHg  Pulse 86  Temp(Src) 97.9 F (36.6 C)  Ht 5\' 4"  (1.626 m)  Wt 184 lb (83.462 kg)  BMI 31.57 kg/m2  SpO2 100%   Subjective:    Patient ID: Allison Cooke, female    DOB: 1960-09-23, 55 y.o.   MRN: [id number]  HPI: Allison Cooke is a 55 y.o. female  Chief Complaint  Patient presents with  . Diabetes    follow up and labs  . Hypertension    follow up and labs  . Knee Pain    she is still having sharp pains in her knees and wants a refill on Tramadol  . Shortness of Breath    she says she'd like an inhaler because she gets SOB sometimes  . Orders    she doesn't know when her last pap was; also needs referral for colonoscopy  . Depression    managed by Ellender Hose   She has diabetes type 2; check FSBS, sometimes 300 at the highest; maybe just once or twice under 100; usually in the 200s; some numbness in the feet; bunion pain; using insulin; 20 units every day  HTN; 130s today; limits salt in her food  Weight gain of 20 pounds; appetite is increased; more depressed; no nodules in the neck; more constipation; energy is low; dry skin  She needs an inhaler, feels like it is hard to breathe sometimes; going on a long while; nothing new  Relevant past medical, surgical, family and social history reviewed and updated as indicated. Interim medical history since our last visit reviewed. Allergies and medications reviewed and updated.  Review of Systems  Per HPI unless specifically indicated above     Objective:    BP 138/89 mmHg  Pulse 86  Temp(Src) 97.9 F (36.6 C)  Ht 5\' 4"  (1.626 m)  Wt 184 lb (83.462 kg)  BMI 31.57 kg/m2  SpO2 100%  Wt Readings from Last 3 Encounters:  11/02/15 184 lb (83.462 kg)  08/03/15 164 lb (74.39 kg)  07/27/15 164 lb (74.39 kg)    Physical Exam  Constitutional: She appears well-developed and well-nourished. No distress.  HENT:  Head: Normocephalic and atraumatic.  Very hoarse voice  Eyes: EOM are normal. No scleral  icterus.  Neck: Neck supple. No JVD present. No tracheal deviation present. No thyroid mass and no thyromegaly present.  Cardiovascular: Normal rate, regular rhythm and normal heart sounds.   No murmur heard. Pulmonary/Chest: Effort normal and breath sounds normal. No respiratory distress. She has no wheezes. She has no rales.  Abdominal: Soft. Bowel sounds are normal. She exhibits no distension.  Musculoskeletal: She exhibits no edema.       Right knee: She exhibits decreased range of motion. She exhibits no effusion. Swelling: anteromedially. Tenderness found.  Neurological: She is alert. She exhibits normal muscle tone.  Skin: Skin is warm and dry. She is not diaphoretic. No pallor.  No xerosis  Psychiatric: Her behavior is normal. Judgment and thought content normal. She exhibits a depressed mood.  Mildly depressed affect, but good eye contact with examiner       Assessment & Plan:   Problem List Items Addressed This Visit      Cardiovascular and Mediastinum   Essential hypertension, benign    Limit salt        Endocrine   Type 2 diabetes mellitus ([place]) - Primary    labs      Relevant Orders   Lipid Panel w/o Chol/HDL Ratio (  Completed)   Hgb A1c w/o eAG (Completed)     Other   Depression, major, recurrent ([place])    Please stay on med; supportive listening provided; I personally called psychiatrist's office and explained that she has transportation issues and to please take that into account, reschedule      Relevant Medications   sertraline (ZOLOFT) 100 MG tablet   Other Relevant Orders   Vitamin B12 (Completed)   TSH (Completed)   T4, free (Completed)   Hoarseness of voice    Has been seen by ENT      Medication monitoring encounter    Check liver and renal function      Relevant Orders   Comprehensive metabolic panel (Completed)   Right anterior knee pain    Refill of tramadol      Relevant Medications   traMADol (ULTRAM) 50 MG tablet      Follow  up plan: Return in about 1 month (around 12/03/2015) for visit with Dr. Sanda Klein at [facility].  An after-visit summary was printed and given to the patient at [facility].  Please see the patient instructions which may contain other information and recommendations beyond what is mentioned above in the assessment and plan. Orders Placed This Encounter  Procedures  . Comprehensive metabolic panel  . Lipid Panel w/o Chol/HDL Ratio  . Hgb A1c w/o eAG  . Vitamin B12  . TSH  . T4, free   Meds ordered this encounter  Medications  . DISCONTD: sertraline (ZOLOFT) 100 MG tablet    Sig: Take 200 mg by mouth daily.  . sertraline (ZOLOFT) 100 MG tablet    Sig: Take 2 tablets (200 mg total) by mouth daily.    Dispense:  60 tablet    Refill:  0  . traMADol (ULTRAM) 50 MG tablet    Sig: Take 1 tablet (50 mg total) by mouth every 6 (six) hours as needed.    Dispense:  30 tablet    Refill:  0

## 2015-11-02 NOTE — Patient Instructions (Signed)
There is hope. I am here to help you. I'll see you in one month.

## 2015-11-02 NOTE — Assessment & Plan Note (Signed)
Check liver and renal function 

## 2015-11-02 NOTE — Assessment & Plan Note (Addendum)
Please stay on med; supportive listening provided; I personally called psychiatrist's office and explained that she has transportation issues and to please take that into account, reschedule

## 2015-11-02 NOTE — Assessment & Plan Note (Signed)
Refill of tramadol. 

## 2015-11-02 NOTE — Assessment & Plan Note (Signed)
labs

## 2015-11-03 ENCOUNTER — Telehealth: Payer: Self-pay | Admitting: Family Medicine

## 2015-11-03 LAB — COMPREHENSIVE METABOLIC PANEL
A/G RATIO: 1.4 (ref 1.2–2.2)
ALT: 28 IU/L (ref 0–32)
AST: 10 IU/L (ref 0–40)
Albumin: 4.4 g/dL (ref 3.5–5.5)
Alkaline Phosphatase: 107 IU/L (ref 39–117)
BUN/Creatinine Ratio: 26 — ABNORMAL HIGH (ref 9–23)
BUN: 19 mg/dL (ref 6–24)
Bilirubin Total: <0.2 mg/dL (ref 0.0–1.2)
CO2: 22 mmol/L (ref 18–29)
CREATININE: 0.74 mg/dL (ref 0.57–1.00)
Calcium: 9.8 mg/dL (ref 8.7–10.2)
Chloride: 99 mmol/L (ref 96–106)
GFR calc Af Amer: 106 mL/min/{1.73_m2} (ref >59)
GFR calc non Af Amer: 92 mL/min/{1.73_m2} (ref >59)
GLOBULIN, TOTAL: 3.1 g/dL (ref 1.5–4.5)
Glucose: 124 mg/dL — ABNORMAL HIGH (ref 65–99)
POTASSIUM: 4.9 mmol/L (ref 3.5–5.2)
SODIUM: 138 mmol/L (ref 134–144)
Total Protein: 7.5 g/dL (ref 6.0–8.5)

## 2015-11-03 LAB — LIPID PANEL W/O CHOL/HDL RATIO
CHOLESTEROL TOTAL: 291 mg/dL — AB (ref 100–199)
HDL: 109 mg/dL (ref >39)
LDL Calculated: 168 mg/dL — ABNORMAL HIGH (ref 0–99)
TRIGLYCERIDES: 68 mg/dL (ref 0–149)
VLDL Cholesterol Cal: 14 mg/dL (ref 5–40)

## 2015-11-03 LAB — VITAMIN B12: Vitamin B-12: 935 pg/mL (ref 211–946)

## 2015-11-03 LAB — T4, FREE: FREE T4: 0.95 ng/dL (ref 0.82–1.77)

## 2015-11-03 LAB — TSH: TSH: 1.58 u[IU]/mL (ref 0.450–4.500)

## 2015-11-03 LAB — HGB A1C W/O EAG: Hgb A1c MFr Bld: 7.5 % — ABNORMAL HIGH (ref 4.8–5.6)

## 2015-11-03 NOTE — Telephone Encounter (Signed)
Call pt with lab results

## 2015-11-04 NOTE — Telephone Encounter (Signed)
I left brief msg; calling about labs

## 2015-11-09 NOTE — Telephone Encounter (Signed)
I left message; A1c much better; still want to talk with her about labs; please call me when available

## 2015-11-10 ENCOUNTER — Other Ambulatory Visit: Payer: Self-pay

## 2015-11-10 DIAGNOSIS — R739 Hyperglycemia, unspecified: Secondary | ICD-10-CM

## 2015-11-10 MED ORDER — ATORVASTATIN CALCIUM 40 MG PO TABS: 40.0000 mg | ORAL_TABLET | Freq: Every day | ORAL | Status: DC

## 2015-11-10 NOTE — Telephone Encounter (Signed)
refilled 

## 2015-11-10 NOTE — Telephone Encounter (Signed)
Routing to provider. She is following you to [facility]. 

## 2015-11-11 NOTE — Telephone Encounter (Signed)
Patient requesting return call °

## 2015-11-11 NOTE — Telephone Encounter (Signed)
I left message; will call her tomorrow

## 2015-11-12 NOTE — Telephone Encounter (Signed)
I reached again; left message; will try again on Monday

## 2015-11-14 NOTE — Telephone Encounter (Signed)
I left another message; will try again tomorrow

## 2015-11-15 NOTE — Telephone Encounter (Signed)
I left another message; asked her to call back if there is another number or another time I should be trying to reach her to go over her labs

## 2015-11-16 ENCOUNTER — Encounter: Payer: Self-pay | Admitting: General Surgery

## 2015-11-16 NOTE — Assessment & Plan Note (Signed)
Has been seen by ENT

## 2015-11-16 NOTE — Telephone Encounter (Signed)
I have tried numerous times to reach patient; have not been successful; I left a detailed msg on her identified voicemail; explained A1c much better; LDL too high, so I'm concerned about her cholesterol, if she's taking her medicine, please call me back about that; her other labs look very good; I'll close out this note

## 2015-11-23 ENCOUNTER — Telehealth: Payer: Self-pay | Admitting: Family Medicine

## 2015-11-23 NOTE — Telephone Encounter (Signed)
Patient returning your call [phone]..think it was pertaining to lab results

## 2015-11-27 MED ORDER — ATORVASTATIN CALCIUM 40 MG PO TABS: 40.0000 mg | ORAL_TABLET | Freq: Every day | ORAL | Status: DC

## 2015-11-27 NOTE — Telephone Encounter (Signed)
I spoke with patient; we talked about her A1c coming down; her lipids are not as good as they should be; start atorvastatin Return to see me for visit and fasting labs on or AFTER June 30th; she will call to make appt

## 2015-12-06 ENCOUNTER — Telehealth: Payer: Self-pay | Admitting: Family Medicine

## 2015-12-06 MED ORDER — ALBUTEROL SULFATE HFA 108 (90 BASE) MCG/ACT IN AERS: 2.0000 | INHALATION_SPRAY | RESPIRATORY_TRACT | Status: DC | PRN

## 2015-12-06 NOTE — Telephone Encounter (Signed)
I do not see inhaler on medication list?

## 2015-12-06 NOTE — Telephone Encounter (Signed)
Please call pharmacy to find out what inhaler(s) she has been prescribed over last 2 years; thanks!

## 2015-12-06 NOTE — Telephone Encounter (Signed)
Pt is requesting refill on inhaler. Pt states at night time she has a hard time breathing and pt state she is suppose to be on one. [facility] [place].

## 2015-12-06 NOTE — Telephone Encounter (Signed)
I sent Rx; let her know I sent Rx to pharmacy; if still having problems, please call us back

## 2015-12-06 NOTE — Telephone Encounter (Signed)
Pharmacy never had rx for inhaler, nor does pt know what the name is?  She states needs one due to SOB at night?

## 2015-12-07 ENCOUNTER — Ambulatory Visit: Payer: Medicaid Other | Admitting: General Surgery

## 2015-12-08 ENCOUNTER — Other Ambulatory Visit: Payer: Self-pay | Admitting: Family Medicine

## 2015-12-08 MED ORDER — LISINOPRIL 10 MG PO TABS: 10.0000 mg | ORAL_TABLET | Freq: Every day | ORAL | Status: DC

## 2015-12-08 MED ORDER — GLIPIZIDE ER 2.5 MG PO TB24: 5.0000 mg | ORAL_TABLET | Freq: Every day | ORAL | Status: DC

## 2015-12-08 NOTE — Telephone Encounter (Signed)
Patient requesting refills on glipizide and lisinopril. [facility]

## 2015-12-13 ENCOUNTER — Ambulatory Visit: Payer: Medicaid Other | Admitting: General Surgery

## 2015-12-19 ENCOUNTER — Ambulatory Visit (INDEPENDENT_AMBULATORY_CARE_PROVIDER_SITE_OTHER): Payer: Medicaid Other | Admitting: General Surgery

## 2015-12-19 ENCOUNTER — Encounter: Payer: Self-pay | Admitting: General Surgery

## 2015-12-19 VITALS — BP 130/74 | HR 77 | Resp 14 | Ht 65.0 in | Wt 184.0 lb

## 2015-12-19 DIAGNOSIS — D242 Benign neoplasm of left breast: Secondary | ICD-10-CM | POA: Diagnosis not present

## 2015-12-19 DIAGNOSIS — N6012 Diffuse cystic mastopathy of left breast: Secondary | ICD-10-CM

## 2015-12-19 NOTE — Patient Instructions (Signed)
Patient to return in six months bilateral diagnotic mammogram Continue self breast exams. Call office for any new breast issues or concerns.

## 2015-12-19 NOTE — Progress Notes (Signed)
Patient ID: Allison Cooke, female   DOB: 03-13-1961, 55 y.o.   MRN: [id number]  Chief Complaint  Patient presents with  . Follow-up    mammogram    HPI Allison Cooke is a 55 y.o. female 6 mos post left breast lumpectomy-intraductal papilloma, fcd, who presents for a breast evaluation. The most recent mammogram was done on 11/09/15. She has had no more discharge from left nipple. Patient does perform regular self breast checks and gets regular mammograms done. She reports some mild tenderness in the left breast at times.   I have reviewed the history of present illness with the patient.  HPI  Past Medical History  Diagnosis Date  . Hypertension   . SOB (shortness of breath)   . Heart murmur   . Headache   . Arthritis   . Anemia   . Seizures ([place])     as a child  . Diabetes mellitus without complication [facility])     Past Surgical History  Procedure Laterality Date  . Cholecystectomy    . Cesarean section    . Fine needle aspiration Left     breast cyst, benign, age 51?  Marland Kitchen Breast surgery Left   . Direct laryngoscopy N/A 04/26/2015    Procedure: Microlaryngoscopy with exxcision of vocal cord polyp ;  Surgeon: Beverly Gust, MD;  Location: [facility] ORS;  Service: ENT;  Laterality: N/A;  . Breast lumpectomy Left 07/14/2015    Procedure: BREAST LUMPECTOMY;  Surgeon: Christene Lye, MD;  Location: [facility] ORS;  Service: General;  Laterality: Left;    Family History  Problem Relation Age of Onset  . Hypertension Mother   . Heart disease Mother   . Hyperlipidemia Mother   . Diabetes Mother   . Heart disease Father   . Hyperlipidemia Father   . Hypertension Father   . Breast cancer Cousin   . Diabetes Sister   . Hypertension Daughter   . Diabetes Maternal Grandmother   . Diabetes Sister   . Cancer Neg Hx   . Stroke Neg Hx   . COPD Neg Hx     Social History Social History  Substance Use Topics  . Smoking status: Current Some Day Smoker -- 0.25 packs/day for 20 years     Types: Cigarettes  . Smokeless tobacco: Never Used  . Alcohol Use: No    Allergies  Allergen Reactions  . Shrimp [Shellfish Allergy] Hives and Itching    Current Outpatient Prescriptions  Medication Sig Dispense Refill  . albuterol (PROVENTIL HFA;VENTOLIN HFA) 108 (90 Base) MCG/ACT inhaler Inhale 2 puffs into the lungs every 4 (four) hours as needed for wheezing or shortness of breath. 1 Inhaler 1  . amLODipine (NORVASC) 5 MG tablet Take 1 tablet (5 mg total) by mouth daily. 30 tablet 5  . atorvastatin (LIPITOR) 40 MG tablet Take 1 tablet (40 mg total) by mouth at bedtime. 30 tablet 2  . B-D INS SYR ULTRAFINE 1CC/31G 31G X 5/16" 1 ML MISC use at bedtime as directed WITH LEVEMIR 100 each 0  . glipiZIDE (GLUCOTROL XL) 2.5 MG 24 hr tablet Take 2 tablets (5 mg total) by mouth daily with breakfast. 60 tablet 1  . glucose blood (ACCU-CHEK AVIVA) test strip Check fingerstick blood sugars three times a day; E11.65, LON 99 months; insulin-dependent; Accu-check Aviva Plus strips 100 each 12  . insulin aspart (NOVOLOG) 100 UNIT/ML injection Inject 5 Units into the skin 3 (three) times daily before meals. IF GLUCOSE IS > 180    .  insulin detemir (LEVEMIR) 100 UNIT/ML injection Inject 0.22 mLs (22 Units total) into the skin every morning. 10 mL 1  . Insulin Pen Needle (PEN NEEDLES) 32G X 4 MM MISC 1 Units by Does not apply route 2 (two) times daily. 100 each 1  . lisinopril (PRINIVIL,ZESTRIL) 10 MG tablet Take 1 tablet (10 mg total) by mouth daily. 30 tablet 1  . metFORMIN (GLUCOPHAGE) 500 MG tablet Take 1 tablet (500 mg total) by mouth 2 (two) times daily with a meal. 60 tablet 5  . Multiple Vitamin (MULTIVITAMIN) tablet Take 1 tablet by mouth daily. 30 tablet 1  . sertraline (ZOLOFT) 100 MG tablet Take 2 tablets (200 mg total) by mouth daily. 60 tablet 0  . traMADol (ULTRAM) 50 MG tablet Take 1 tablet (50 mg total) by mouth every 6 (six) hours as needed. 30 tablet 0   No current  facility-administered medications for this visit.   Facility-Administered Medications Ordered in Other Visits  Medication Dose Route Frequency Provider Last Rate Last Dose  . albuterol (PROVENTIL) (2.5 MG/3ML) 0.083% nebulizer solution 2.5 mg  2.5 mg Nebulization Once Arnetha Courser, MD   2.5 mg at 03/08/15 [id number]    Review of Systems Review of Systems  Constitutional: Negative.   Respiratory: Negative.   Cardiovascular: Negative.     Blood pressure 130/74, pulse 77, resp. rate 14, height 5\' 5"  (1.651 m), weight 184 lb (83.462 kg).  Physical Exam Physical Exam  Constitutional: She is oriented to person, place, and time. She appears well-developed and well-nourished.  Eyes: Conjunctivae are normal. No scleral icterus.  Neck: Neck supple.  Cardiovascular: Normal rate, regular rhythm and normal heart sounds.   Pulmonary/Chest: Effort normal and breath sounds normal. Right breast exhibits no inverted nipple, no mass, no nipple discharge, no skin change and no tenderness. Left breast exhibits no inverted nipple, no mass, no nipple discharge, no skin change and no tenderness.    No more left breast discharge from nipple.   Abdominal: Soft. Bowel sounds are normal. There is no tenderness.  Lymphadenopathy:    She has no cervical adenopathy.    She has no axillary adenopathy.  Neurological: She is alert and oriented to person, place, and time.  Skin: Skin is warm and dry.    Data Reviewed Mammogram right reviewed -stable  Assessment    Stable exam. History of benign neoplasm left breast, FCD      Plan    Patient to return in six months with bilateral diagnotic mammogram    PCP: Enid Derry This has been scribed by Lesly Rubenstein LPN    [facility] G 12/19/2015, 9:32 AM

## 2015-12-30 ENCOUNTER — Encounter: Payer: Self-pay | Admitting: Family Medicine

## 2016-01-04 ENCOUNTER — Ambulatory Visit (INDEPENDENT_AMBULATORY_CARE_PROVIDER_SITE_OTHER): Payer: Medicaid Other | Admitting: Family Medicine

## 2016-01-04 ENCOUNTER — Encounter: Payer: Self-pay | Admitting: Family Medicine

## 2016-01-04 VITALS — BP 116/74 | HR 87 | Temp 97.8°F | Resp 16 | Wt 179.0 lb

## 2016-01-04 DIAGNOSIS — F4312 Post-traumatic stress disorder, chronic: Secondary | ICD-10-CM | POA: Diagnosis not present

## 2016-01-04 DIAGNOSIS — Z794 Long term (current) use of insulin: Secondary | ICD-10-CM

## 2016-01-04 DIAGNOSIS — F331 Major depressive disorder, recurrent, moderate: Secondary | ICD-10-CM

## 2016-01-04 DIAGNOSIS — M25561 Pain in right knee: Secondary | ICD-10-CM

## 2016-01-04 DIAGNOSIS — I10 Essential (primary) hypertension: Secondary | ICD-10-CM | POA: Diagnosis not present

## 2016-01-04 DIAGNOSIS — E1165 Type 2 diabetes mellitus with hyperglycemia: Secondary | ICD-10-CM | POA: Diagnosis not present

## 2016-01-04 DIAGNOSIS — R739 Hyperglycemia, unspecified: Secondary | ICD-10-CM

## 2016-01-04 DIAGNOSIS — Z1211 Encounter for screening for malignant neoplasm of colon: Secondary | ICD-10-CM | POA: Diagnosis not present

## 2016-01-04 MED ORDER — METFORMIN HCL 500 MG PO TABS: 1000.0000 mg | ORAL_TABLET | Freq: Two times a day (BID) | ORAL | Status: DC

## 2016-01-04 MED ORDER — "BD INSULIN SYR ULTRAFINE II 31G X 5/16"" 1 ML MISC": Status: DC

## 2016-01-04 MED ORDER — ALBUTEROL SULFATE HFA 108 (90 BASE) MCG/ACT IN AERS: 2.0000 | INHALATION_SPRAY | RESPIRATORY_TRACT | Status: DC | PRN

## 2016-01-04 MED ORDER — TRAMADOL HCL 50 MG PO TABS: 50.0000 mg | ORAL_TABLET | Freq: Four times a day (QID) | ORAL | Status: DC | PRN

## 2016-01-04 MED ORDER — INSULIN DETEMIR 100 UNIT/ML ~~LOC~~ SOLN: 25.0000 [IU] | SUBCUTANEOUS | Status: DC

## 2016-01-04 MED ORDER — LISINOPRIL 10 MG PO TABS: 10.0000 mg | ORAL_TABLET | Freq: Every day | ORAL | Status: DC

## 2016-01-04 MED ORDER — AMLODIPINE BESYLATE 5 MG PO TABS: 5.0000 mg | ORAL_TABLET | Freq: Every day | ORAL | Status: DC

## 2016-01-04 MED ORDER — GLUCOSE BLOOD VI STRP
ORAL_STRIP | Status: DC
Start: 2016-01-04 — End: 2016-02-10

## 2016-01-04 MED ORDER — ATORVASTATIN CALCIUM 40 MG PO TABS: 40.0000 mg | ORAL_TABLET | Freq: Every day | ORAL | Status: DC

## 2016-01-04 MED ORDER — INSULIN ASPART 100 UNIT/ML ~~LOC~~ SOLN: 5.0000 [IU] | Freq: Three times a day (TID) | SUBCUTANEOUS | Status: DC
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
## 2016-01-04 NOTE — Patient Instructions (Addendum)
I'll talk with Allison Cooke today at [facility] see about EMDR for treatment  Return on or just after June 30th for next visit and labs  Please do see your eye doctor regularly, and have your eyes examined every year (or more often per his or her recommendation) Check your feet every night and let me know right away of any sores, infections, numbness, etc. Try to limit sweets, white bread, white rice, white potatoes  Check your fingerstick blood sugars 3x a day STOP glipizide Increase metformin Increase the levemir Keep the novolog the same

## 2016-01-18 ENCOUNTER — Other Ambulatory Visit: Payer: Self-pay

## 2016-01-18 ENCOUNTER — Telehealth: Payer: Self-pay

## 2016-01-18 NOTE — Telephone Encounter (Signed)
Gastroenterology Pre-Procedure Review  Request Date: 03/06/16 Requesting Physician: Dr. Sanda Klein  PATIENT REVIEW QUESTIONS: The patient responded to the following health history questions as indicated:    1. Are you having any GI issues? no 2. Do you have a personal history of Polyps? no 3. Do you have a family history of Colon Cancer or Polyps? no 4. Diabetes Mellitus? yes (Type 2) 5. Joint replacements in the past 12 months?no 6. Major health problems in the past 3 months?no 7. Any artificial heart valves, MVP, or defibrillator?no    MEDICATIONS & ALLERGIES:    Patient reports the following regarding taking any anticoagulation/antiplatelet therapy:   Plavix, Coumadin, Eliquis, Xarelto, Lovenox, Pradaxa, Brilinta, or Effient? no Aspirin? no  Patient confirms/reports the following medications:  Current Outpatient Prescriptions  Medication Sig Dispense Refill  . albuterol (PROVENTIL HFA;VENTOLIN HFA) 108 (90 Base) MCG/ACT inhaler Inhale 2 puffs into the lungs every 4 (four) hours as needed for wheezing or shortness of breath. 1 Inhaler 2  . amLODipine (NORVASC) 5 MG tablet Take 1 tablet (5 mg total) by mouth daily. 30 tablet 5  . atorvastatin (LIPITOR) 40 MG tablet Take 1 tablet (40 mg total) by mouth at bedtime. 30 tablet 2  . B-D INS SYR ULTRAFINE 1CC/31G 31G X 5/16" 1 ML MISC use at bedtime as directed WITH LEVEMIR 100 each 0  . glucose blood (ACCU-CHEK AVIVA) test strip Check fingerstick blood sugars three times a day; E11.65, LON 99 months; insulin-dependent; Accu-check Aviva Plus strips 100 each 12  . insulin aspart (NOVOLOG) 100 UNIT/ML injection Inject 5 Units into the skin 3 (three) times daily with meals. 10 mL 1  . insulin detemir (LEVEMIR) 100 UNIT/ML injection Inject 0.25 mLs (25 Units total) into the skin every morning. 10 mL 1  . Insulin Pen Needle (PEN NEEDLES) 32G X 4 MM MISC 1 Units by Does not apply route 2 (two) times daily. 100 each 1  . lisinopril (PRINIVIL,ZESTRIL) 10  MG tablet Take 1 tablet (10 mg total) by mouth daily. 30 tablet 1  . metFORMIN (GLUCOPHAGE) 500 MG tablet Take 2 tablets (1,000 mg total) by mouth 2 (two) times daily with a meal. 120 tablet 5  . Multiple Vitamin (MULTIVITAMIN) tablet Take 1 tablet by mouth daily. 30 tablet 1  . sertraline (ZOLOFT) 100 MG tablet Take 2 tablets (200 mg total) by mouth daily. 60 tablet 0  . traMADol (ULTRAM) 50 MG tablet Take 1 tablet (50 mg total) by mouth every 6 (six) hours as needed. 30 tablet 0   No current facility-administered medications for this visit.   Facility-Administered Medications Ordered in Other Visits  Medication Dose Route Frequency Provider Last Rate Last Dose  . albuterol (PROVENTIL) (2.5 MG/3ML) 0.083% nebulizer solution 2.5 mg  2.5 mg Nebulization Once Arnetha Courser, MD   2.5 mg at 03/08/15 [id number]    Patient confirms/reports the following allergies:  Allergies  Allergen Reactions  . Shrimp [Shellfish Allergy] Hives and Itching    No orders of the defined types were placed in this encounter.    AUTHORIZATION INFORMATION Primary Insurance: 1D#: Group #:  Secondary Insurance: 1D#: Group #:  SCHEDULE INFORMATION: Date: 03/06/16 Time: Location: [place]

## 2016-01-25 ENCOUNTER — Encounter: Payer: Self-pay | Admitting: Emergency Medicine

## 2016-01-25 ENCOUNTER — Emergency Department
Admission: EM | Admit: 2016-01-25 | Discharge: 2016-01-25 | Disposition: A | Discharge status: Home / Self Care | Payer: Medicaid Other | Attending: Emergency Medicine | Admitting: Emergency Medicine

## 2016-01-25 DIAGNOSIS — E1165 Type 2 diabetes mellitus with hyperglycemia: Secondary | ICD-10-CM | POA: Diagnosis not present

## 2016-01-25 DIAGNOSIS — Z794 Long term (current) use of insulin: Secondary | ICD-10-CM | POA: Insufficient documentation

## 2016-01-25 DIAGNOSIS — R739 Hyperglycemia, unspecified: Secondary | ICD-10-CM

## 2016-01-25 DIAGNOSIS — E119 Type 2 diabetes mellitus without complications: Secondary | ICD-10-CM | POA: Diagnosis not present

## 2016-01-25 DIAGNOSIS — Z79899 Other long term (current) drug therapy: Secondary | ICD-10-CM | POA: Diagnosis not present

## 2016-01-25 DIAGNOSIS — M1990 Unspecified osteoarthritis, unspecified site: Secondary | ICD-10-CM | POA: Insufficient documentation

## 2016-01-25 DIAGNOSIS — F17210 Nicotine dependence, cigarettes, uncomplicated: Secondary | ICD-10-CM | POA: Insufficient documentation

## 2016-01-25 DIAGNOSIS — Z7984 Long term (current) use of oral hypoglycemic drugs: Secondary | ICD-10-CM | POA: Insufficient documentation

## 2016-01-25 DIAGNOSIS — I10 Essential (primary) hypertension: Secondary | ICD-10-CM | POA: Diagnosis not present

## 2016-01-25 DIAGNOSIS — N390 Urinary tract infection, site not specified: Secondary | ICD-10-CM | POA: Diagnosis not present

## 2016-01-25 LAB — COMPREHENSIVE METABOLIC PANEL
ALBUMIN: 4.4 g/dL (ref 3.5–5.0)
ALK PHOS: 114 U/L (ref 38–126)
ALT: 24 U/L (ref 14–54)
ANION GAP: 10 (ref 5–15)
AST: 11 U/L — ABNORMAL LOW (ref 15–41)
BUN: 30 mg/dL — ABNORMAL HIGH (ref 6–20)
CALCIUM: 9.9 mg/dL (ref 8.9–10.3)
CHLORIDE: 101 mmol/L (ref 101–111)
CO2: 20 mmol/L — AB (ref 22–32)
Creatinine, Ser: 1.16 mg/dL — ABNORMAL HIGH (ref 0.44–1.00)
GFR, EST NON AFRICAN AMERICAN: 52 mL/min — AB (ref >60)
Glucose, Bld: 423 mg/dL — ABNORMAL HIGH (ref 65–99)
Potassium: 4.7 mmol/L (ref 3.5–5.1)
SODIUM: 131 mmol/L — AB (ref 135–145)
Total Bilirubin: 0.6 mg/dL (ref 0.3–1.2)
Total Protein: 8 g/dL (ref 6.5–8.1)

## 2016-01-25 LAB — URINALYSIS COMPLETE WITH MICROSCOPIC (ARMC ONLY)
BILIRUBIN URINE: NEGATIVE
Glucose, UA: >500 mg/dL — AB
NITRITE: NEGATIVE
PROTEIN: NEGATIVE mg/dL
SPECIFIC GRAVITY, URINE: 1.016 (ref 1.005–1.030)
pH: 5 (ref 5.0–8.0)

## 2016-01-25 LAB — CBC
HCT: 34.8 % — ABNORMAL LOW (ref 35.0–47.0)
HEMOGLOBIN: 12 g/dL (ref 12.0–16.0)
MCH: 30 pg (ref 26.0–34.0)
MCHC: 34.5 g/dL (ref 32.0–36.0)
MCV: 87 fL (ref 80.0–100.0)
Platelets: 204 10*3/uL (ref 150–440)
RBC: 4.01 MIL/uL (ref 3.80–5.20)
RDW: 13.9 % (ref 11.5–14.5)
WBC: 7.6 10*3/uL (ref 3.6–11.0)

## 2016-01-25 LAB — GLUCOSE, CAPILLARY: Glucose-Capillary: 291 mg/dL — ABNORMAL HIGH (ref 65–99)

## 2016-01-25 MED ORDER — CEPHALEXIN 500 MG PO CAPS
500.0000 mg | ORAL_CAPSULE | Freq: Once | ORAL | Status: AC
  Administered 2016-01-25: 500 mg via ORAL
  Filled 2016-01-25: qty 1

## 2016-01-25 MED ORDER — SODIUM CHLORIDE 0.9 % IV BOLUS (SEPSIS)
1000.0000 mL | Freq: Once | INTRAVENOUS | Status: AC
  Administered 2016-01-25: 1000 mL via INTRAVENOUS

## 2016-01-25 MED ORDER — CEPHALEXIN 500 MG PO CAPS: 500.0000 mg | ORAL_CAPSULE | Freq: Three times a day (TID) | ORAL | Status: DC

## 2016-01-25 MED ORDER — INSULIN ASPART 100 UNIT/ML ~~LOC~~ SOLN
8.0000 [IU] | Freq: Once | SUBCUTANEOUS | Status: AC
  Administered 2016-01-25: 8 [IU] via INTRAVENOUS
  Filled 2016-01-25: qty 8

## 2016-01-25 NOTE — ED Provider Notes (Signed)
[facility] Emergency Department Provider Note  Time seen: 8:25 AM  I have reviewed the triage vital signs and the nursing notes.   HISTORY  Chief Complaint Hyperglycemia    HPI Allison Cooke is a 55 y.o. female with a past medical history of hypertension, diabetes, seizure disorder, who presents to the emergency department with a blood sugar. According to the patient her blood glucose was too high to read last night, she states this morning he was in the 400s, when she rechecked it again this morning it once again read too high to read so she came to the emergency department for evaluation. Patient states for the past one month her blood sugars have been elevated eyes 400s. The patient relates this to starting new psychiatric medications sertraline and trazodone. Patient states her doctor increased her metformin and insulin dose one month ago, but her blood sugars remained high. Patient denies any recent illnesses, nausea, vomiting, diarrhea or dysuria.     Past Medical History  Diagnosis Date  . Hypertension   . SOB (shortness of breath)   . Heart murmur   . Headache   . Arthritis   . Anemia   . Seizures ([place])     as a child  . Diabetes mellitus without complication [facility])     Patient Active Problem List   Diagnosis Date Noted  . Right anterior knee pain 08/03/2015  . Type 2 diabetes mellitus ([place]) 06/28/2015  . Abnormal EKG 04/22/2015  . Dysphagia 03/30/2015  . Medication monitoring encounter 03/30/2015  . Essential hypertension, benign 03/02/2015  . Depression, major, recurrent ([place]) 03/02/2015  . Tobacco abuse 03/02/2015  . Chronic dermatitis 03/02/2015  . Hoarseness of voice 03/02/2015    Past Surgical History  Procedure Laterality Date  . Cholecystectomy    . Cesarean section    . Fine needle aspiration Left     breast cyst, benign, age 68?  Marland Kitchen Breast surgery Left   . Direct laryngoscopy N/A 04/26/2015    Procedure: Microlaryngoscopy  with exxcision of vocal cord polyp ;  Surgeon: Beverly Gust, MD;  Location: [facility] ORS;  Service: ENT;  Laterality: N/A;  . Breast lumpectomy Left 07/14/2015    Procedure: BREAST LUMPECTOMY;  Surgeon: Christene Lye, MD;  Location: [facility] ORS;  Service: General;  Laterality: Left;    Current Outpatient Rx  Name  Route  Sig  Dispense  Refill  . albuterol (PROVENTIL HFA;VENTOLIN HFA) 108 (90 Base) MCG/ACT inhaler   Inhalation   Inhale 2 puffs into the lungs every 4 (four) hours as needed for wheezing or shortness of breath.   1 Inhaler   2   . amLODipine (NORVASC) 5 MG tablet   Oral   Take 1 tablet (5 mg total) by mouth daily.   30 tablet   5   . atorvastatin (LIPITOR) 40 MG tablet   Oral   Take 1 tablet (40 mg total) by mouth at bedtime.   30 tablet   2   . B-D INS SYR ULTRAFINE 1CC/31G 31G X 5/16" 1 ML MISC      use at bedtime as directed WITH LEVEMIR   100 each   0     Dispense as written.   Marland Kitchen glucose blood (ACCU-CHEK AVIVA) test strip      Check fingerstick blood sugars three times a day; E11.65, LON 99 months; insulin-dependent; Accu-check Aviva Plus strips   100 each   12   . insulin aspart (NOVOLOG) 100  UNIT/ML injection   Subcutaneous   Inject 5 Units into the skin 3 (three) times daily with meals.   10 mL   1   . insulin detemir (LEVEMIR) 100 UNIT/ML injection   Subcutaneous   Inject 0.25 mLs (25 Units total) into the skin every morning.   10 mL   1   . Insulin Pen Needle (PEN NEEDLES) 32G X 4 MM MISC   Does not apply   1 Units by Does not apply route 2 (two) times daily.   100 each   1   . lisinopril (PRINIVIL,ZESTRIL) 10 MG tablet   Oral   Take 1 tablet (10 mg total) by mouth daily.   30 tablet   1   . metFORMIN (GLUCOPHAGE) 500 MG tablet   Oral   Take 2 tablets (1,000 mg total) by mouth 2 (two) times daily with a meal.   120 tablet   5   . Multiple Vitamin (MULTIVITAMIN) tablet   Oral   Take 1 tablet by mouth daily.   30 tablet    1   . sertraline (ZOLOFT) 100 MG tablet   Oral   Take 2 tablets (200 mg total) by mouth daily.   60 tablet   0   . traMADol (ULTRAM) 50 MG tablet   Oral   Take 1 tablet (50 mg total) by mouth every 6 (six) hours as needed.   30 tablet   0     Allergies Shrimp  Family History  Problem Relation Age of Onset  . Hypertension Mother   . Heart disease Mother   . Hyperlipidemia Mother   . Diabetes Mother   . Heart disease Father   . Hyperlipidemia Father   . Hypertension Father   . Breast cancer Cousin   . Diabetes Sister   . Hypertension Daughter   . Diabetes Maternal Grandmother   . Diabetes Sister   . Cancer Neg Hx   . Stroke Neg Hx   . COPD Neg Hx     Social History Social History  Substance Use Topics  . Smoking status: Current Some Day Smoker -- 0.25 packs/day for 20 years    Types: Cigarettes  . Smokeless tobacco: Never Used  . Alcohol Use: No    Review of Systems Constitutional: Negative for fever. Cardiovascular: Negative for chest pain. Respiratory: Negative for shortness of breath. Gastrointestinal: Negative for abdominal pain, vomiting and diarrhea. Genitourinary: Negative for dysuria. Neurological: Negative for headache 10-point ROS otherwise negative.  ____________________________________________   PHYSICAL EXAM:  VITAL SIGNS: ED Triage Vitals  Enc Vitals Group     BP 01/25/16 0758 131/111 mmHg     Pulse Rate 01/25/16 0758 93     Resp 01/25/16 0758 20     Temp 01/25/16 0758 97.8 F (36.6 C)     Temp Source 01/25/16 0758 Oral     SpO2 01/25/16 0758 96 %     Weight --      Height --      Head Cir --      Peak Flow --      Pain Score --      Pain Loc --      Pain Edu? --      Excl. in [place]? --     Constitutional: Alert and oriented. Well appearing and in no distress. Eyes: Normal exam ENT   Head: Normocephalic and atraumatic   Mouth/Throat: Mucous membranes are moist. Cardiovascular: Normal rate, regular rhythm. No  murmur  Respiratory: Normal respiratory effort without tachypnea nor retractions. Breath sounds are clear  Gastrointestinal: Soft and nontender. No distention.   Musculoskeletal: Nontender with normal range of motion in all extremities. No lower extremity tenderness or edema. Neurologic:  Normal speech and language. No gross focal neurologic deficits Skin:  Skin is warm, dry and intact.  Psychiatric: Mood and affect are normal.   ____________________________________________    INITIAL IMPRESSION / ASSESSMENT AND PLAN / ED COURSE  Pertinent labs & imaging results that were available during my care of the patient were reviewed by me and considered in my medical decision making (see chart for details).  Patient presents the emergency department for elevated blood glucose. Patient states her blood glucose has been elevated for the past one month since starting new medications. Patient has not spoken to her doctor today. Has not taken her medications today. We will check labs, dose IV fluids, likely dose IV insulin to decrease the patient's blood glucose. Overall the patient appears well, nontoxic.  Labs are consistent with hyperglycemia as well as a urinary tract infection. Blood sugars come down nicely after insulin and IV fluids. We will discharge the patient home with Keflex for her urinary tract infection. Patient follow-up with her primary care doctor this week to discuss her current diabetic regimen.  ____________________________________________   FINAL CLINICAL IMPRESSION(S) / ED DIAGNOSES  Hyperglycemia Urinary tract infection  Harvest Dark, MD 01/25/16 1156

## 2016-01-25 NOTE — ED Notes (Addendum)
Pt presents with high blood sugar for one month and has been taking her insulin and metformin as directed.  Reports numbers in the 400's.

## 2016-01-25 NOTE — Discharge Instructions (Signed)
Hyperglycemia °Hyperglycemia occurs when the glucose (sugar) in your blood is too high. Hyperglycemia can happen for many reasons, but it most often happens to people who do not know they have diabetes or are not managing their diabetes properly.  °CAUSES  °Whether you have diabetes or not, there are other causes of hyperglycemia. Hyperglycemia can occur when you have diabetes, but it can also occur in other situations that you might not be as aware of, such as: °Diabetes °· If you have diabetes and are having problems controlling your blood glucose, hyperglycemia could occur because of some of the following reasons: °· Not following your meal plan. °· Not taking your diabetes medications or not taking it properly. °· Exercising less or doing less activity than you normally do. °· Being sick. °Pre-diabetes °· This cannot be ignored. Before people develop Type 2 diabetes, they almost always have "pre-diabetes." This is when your blood glucose levels are higher than normal, but not yet high enough to be diagnosed as diabetes. Research has shown that some long-term damage to the body, especially the heart and circulatory system, may already be occurring during pre-diabetes. If you take action to manage your blood glucose when you have pre-diabetes, you may delay or prevent Type 2 diabetes from developing. °Stress °· If you have diabetes, you may be "diet" controlled or on oral medications or insulin to control your diabetes. However, you may find that your blood glucose is higher than usual in the hospital whether you have diabetes or not. This is often referred to as "stress hyperglycemia." Stress can elevate your blood glucose. This happens because of hormones put out by the body during times of stress. If stress has been the cause of your high blood glucose, it can be followed regularly by your caregiver. That way he/she can make sure your hyperglycemia does not continue to get worse or progress to  diabetes. °Steroids °· Steroids are medications that act on the infection fighting system (immune system) to block inflammation or infection. One side effect can be a rise in blood glucose. Most people can produce enough extra insulin to allow for this rise, but for those who cannot, steroids make blood glucose levels go even higher. It is not unusual for steroid treatments to "uncover" diabetes that is developing. It is not always possible to determine if the hyperglycemia will go away after the steroids are stopped. A special blood test called an A1c is sometimes done to determine if your blood glucose was elevated before the steroids were started. °SYMPTOMS °· Thirsty. °· Frequent urination. °· Dry mouth. °· Blurred vision. °· Tired or fatigue. °· Weakness. °· Sleepy. °· Tingling in feet or leg. °DIAGNOSIS  °Diagnosis is made by monitoring blood glucose in one or all of the following ways: °· A1c test. This is a chemical found in your blood. °· Fingerstick blood glucose monitoring. °· Laboratory results. °TREATMENT  °First, knowing the cause of the hyperglycemia is important before the hyperglycemia can be treated. Treatment may include, but is not be limited to: °· Education. °· Change or adjustment in medications. °· Change or adjustment in meal plan. °· Treatment for an illness, infection, etc. °· More frequent blood glucose monitoring. °· Change in exercise plan. °· Decreasing or stopping steroids. °· Lifestyle changes. °HOME CARE INSTRUCTIONS  °· Test your blood glucose as directed. °· Exercise regularly. Your caregiver will give you instructions about exercise. Pre-diabetes or diabetes which comes on with stress is helped by exercising. °· Eat wholesome,   balanced meals. Eat often and at regular, fixed times. Your caregiver or nutritionist will give you a meal plan to guide your sugar intake.  Being at an ideal weight is important. If needed, losing as little as 10 to 15 pounds may help improve blood  glucose levels. SEEK MEDICAL CARE IF:   You have questions about medicine, activity, or diet.  You continue to have symptoms (problems such as increased thirst, urination, or weight gain). SEEK IMMEDIATE MEDICAL CARE IF:   You are vomiting or have diarrhea.  Your breath smells fruity.  You are breathing faster or slower.  You are very sleepy or incoherent.  You have numbness, tingling, or pain in your feet or hands.  You have chest pain.  Your symptoms get worse even though you have been following your caregiver's orders.  If you have any other questions or concerns.   This information is not intended to replace advice given to you by your health care provider. Make sure you discuss any questions you have with your health care provider.   Document Released: 01/16/2001 Document Revised: 10/15/2011 Document Reviewed: 03/29/2015 Elsevier Interactive Patient Education 2016 [employer]  Urinary Tract Infection A urinary tract infection (UTI) can occur any place along the urinary tract. The tract includes the kidneys, ureters, bladder, and urethra. A type of germ called bacteria often causes a UTI. UTIs are often helped with antibiotic medicine.  HOME CARE   If given, take antibiotics as told by your doctor. Finish them even if you start to feel better.  Drink enough fluids to keep your pee (urine) clear or pale yellow.  Avoid tea, drinks with caffeine, and bubbly (carbonated) drinks.  Pee often. Avoid holding your pee in for a long time.  Pee before and after having sex (intercourse).  Wipe from front to back after you poop (bowel movement) if you are a woman. Use each tissue only once. GET HELP RIGHT AWAY IF:   You have back pain.  You have lower belly (abdominal) pain.  You have chills.  You feel sick to your stomach (nauseous).  You throw up (vomit).  Your burning or discomfort with peeing does not go away.  You have a fever.  Your symptoms are not better  in 3 days. MAKE SURE YOU:   Understand these instructions.  Will watch your condition.  Will get help right away if you are not doing well or get worse.   This information is not intended to replace advice given to you by your health care provider. Make sure you discuss any questions you have with your health care provider.   Document Released: 01/09/2008 Document Revised: 08/13/2014 Document Reviewed: 02/21/2012 Elsevier Interactive Patient Education [employer].

## 2016-01-25 NOTE — ED Notes (Signed)
Pt arrives with reports of elevated blood sugars  Last check was this am and she reports a FSBS of 450

## 2016-01-26 ENCOUNTER — Telehealth: Payer: Self-pay | Admitting: Family Medicine

## 2016-01-26 MED ORDER — INSULIN DETEMIR 100 UNIT/ML ~~LOC~~ SOLN: 30.0000 [IU] | SUBCUTANEOUS | Status: DC

## 2016-01-26 NOTE — Telephone Encounter (Signed)
Have her increased her long-acting once a day insulin to thirty units daily Continue the meal-time insulin, six units of the short-acting with each meal Really watch diet Get her in for first available

## 2016-01-26 NOTE — Telephone Encounter (Signed)
Pt notified will make appt

## 2016-01-26 NOTE — Telephone Encounter (Signed)
Pt was in the ER yesterday for her diabetes. Pt needs a ER fu as soon as she can. There are no availabilties for this week and possibly next week. Please advise as to when she can be seen. [phone]

## 2016-01-27 LAB — URINE CULTURE: Culture: >=100000 — AB

## 2016-01-29 ENCOUNTER — Encounter: Payer: Self-pay | Admitting: Family Medicine

## 2016-01-29 DIAGNOSIS — F4312 Post-traumatic stress disorder, chronic: Secondary | ICD-10-CM

## 2016-01-29 HISTORY — DX: Post-traumatic stress disorder, chronic: F43.12

## 2016-01-29 NOTE — Assessment & Plan Note (Signed)
Patient has seen orthopaedics for this, but has not had an MRI; will ask her to see ortho again

## 2016-01-29 NOTE — Assessment & Plan Note (Signed)
This seems to be her biggest issue right now; I spoke with patient about EMDR and promised her that I would speak with her counselor about this after our visit; later, I did speak with [place] and she agreed to look into EMDR as a treatment option for her PTSD; supportive listening and encouragement given to patient

## 2016-01-29 NOTE — Assessment & Plan Note (Signed)
Patient is seeing therapist and psychiatrist; I called therapist and suggested they consider EMDR as a treatment modality for her; supportive listening provided to patient; I am here for her

## 2016-01-29 NOTE — Assessment & Plan Note (Signed)
Controlled today 

## 2016-01-29 NOTE — Assessment & Plan Note (Addendum)
Encouraged healthier eating, avoidance of sugary drinks, white bread, etc; foot exam by MD today; A1c due in June; stop sulfonylurea; increase long-acting insulin; leave short-acting insulin the same; increase metformin; return in June

## 2016-02-03 ENCOUNTER — Ambulatory Visit (INDEPENDENT_AMBULATORY_CARE_PROVIDER_SITE_OTHER): Payer: Medicaid Other | Admitting: Family Medicine

## 2016-02-03 ENCOUNTER — Encounter: Payer: Self-pay | Admitting: Family Medicine

## 2016-02-03 VITALS — BP 122/84 | HR 97 | Temp 98.5°F | Resp 16 | Wt 174.0 lb

## 2016-02-03 DIAGNOSIS — R440 Auditory hallucinations: Secondary | ICD-10-CM

## 2016-02-03 DIAGNOSIS — R269 Unspecified abnormalities of gait and mobility: Secondary | ICD-10-CM | POA: Diagnosis not present

## 2016-02-03 DIAGNOSIS — E1165 Type 2 diabetes mellitus with hyperglycemia: Secondary | ICD-10-CM | POA: Diagnosis not present

## 2016-02-03 DIAGNOSIS — R51 Headache: Secondary | ICD-10-CM

## 2016-02-03 DIAGNOSIS — R441 Visual hallucinations: Secondary | ICD-10-CM | POA: Diagnosis not present

## 2016-02-03 DIAGNOSIS — Z794 Long term (current) use of insulin: Secondary | ICD-10-CM | POA: Diagnosis not present

## 2016-02-03 DIAGNOSIS — F331 Major depressive disorder, recurrent, moderate: Secondary | ICD-10-CM

## 2016-02-03 DIAGNOSIS — N3000 Acute cystitis without hematuria: Secondary | ICD-10-CM

## 2016-02-03 DIAGNOSIS — M25561 Pain in right knee: Secondary | ICD-10-CM | POA: Diagnosis not present

## 2016-02-03 DIAGNOSIS — M545 Low back pain, unspecified: Secondary | ICD-10-CM

## 2016-02-03 DIAGNOSIS — R519 Headache, unspecified: Secondary | ICD-10-CM

## 2016-02-03 HISTORY — DX: Low back pain, unspecified: M54.50

## 2016-02-03 MED ORDER — INSULIN ASPART 100 UNIT/ML ~~LOC~~ SOLN: 9.0000 [IU] | Freq: Three times a day (TID) | SUBCUTANEOUS | Status: DC

## 2016-02-03 MED ORDER — TRAMADOL HCL 50 MG PO TABS: 50.0000 mg | ORAL_TABLET | Freq: Four times a day (QID) | ORAL | Status: DC | PRN

## 2016-02-03 NOTE — Assessment & Plan Note (Signed)
Uncontrolled; increase meal-time short-acting to nine units TID and leave long-acting alone; check sugars 3x a day and call me next week with update for further insulin adjustment; last A1c was 7.5 3 months ago; patient did not feel like having blood drawn today so we'll bring her back in 3 weeks for fasting labs and visit; call sooner through with sugar updates

## 2016-02-03 NOTE — Assessment & Plan Note (Signed)
Seeing psychiatrist and therapist; on medication; she promises to call 911 if any thoughts of self-harm; has supportive family; will get head CT to r/o any frontal lobe tumor in particular with worsening mood and other symptoms

## 2016-02-03 NOTE — Patient Instructions (Addendum)
Increase the Novolog to NINE units three times a day with meals Continue the Levemir at THIRTY units once a day Check your sugars 3x a day and call me in one week with an update and we can adjust your insulin more Have xrays of your lower back done next week We'll get a head CT next week Your last A1c was 7.5 in late March  You will rise... :-)

## 2016-02-03 NOTE — Assessment & Plan Note (Signed)
Will get lumbar spine series; chronic pain; limited Rx for tramadol given today; refer to back specialist
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
## 2016-02-06 ENCOUNTER — Ambulatory Visit: Payer: Medicaid Other | Admitting: Family Medicine

## 2016-02-08 ENCOUNTER — Telehealth: Payer: Self-pay | Admitting: Family Medicine

## 2016-02-08 ENCOUNTER — Inpatient Hospital Stay
Admission: EM | Admit: 2016-02-08 | Discharge: 2016-02-10 | DRG: 638 | Disposition: A | Discharge status: Home / Self Care | Payer: Medicaid Other | Attending: Internal Medicine | Admitting: Internal Medicine

## 2016-02-08 DIAGNOSIS — R011 Cardiac murmur, unspecified: Secondary | ICD-10-CM | POA: Diagnosis present

## 2016-02-08 DIAGNOSIS — M1990 Unspecified osteoarthritis, unspecified site: Secondary | ICD-10-CM | POA: Diagnosis present

## 2016-02-08 DIAGNOSIS — E1110 Type 2 diabetes mellitus with ketoacidosis without coma: Secondary | ICD-10-CM | POA: Diagnosis present

## 2016-02-08 DIAGNOSIS — E875 Hyperkalemia: Secondary | ICD-10-CM | POA: Diagnosis present

## 2016-02-08 DIAGNOSIS — E1010 Type 1 diabetes mellitus with ketoacidosis without coma: Secondary | ICD-10-CM | POA: Diagnosis not present

## 2016-02-08 DIAGNOSIS — Z23 Encounter for immunization: Secondary | ICD-10-CM | POA: Diagnosis not present

## 2016-02-08 DIAGNOSIS — Z8249 Family history of ischemic heart disease and other diseases of the circulatory system: Secondary | ICD-10-CM | POA: Diagnosis not present

## 2016-02-08 DIAGNOSIS — F17210 Nicotine dependence, cigarettes, uncomplicated: Secondary | ICD-10-CM | POA: Diagnosis present

## 2016-02-08 DIAGNOSIS — R739 Hyperglycemia, unspecified: Secondary | ICD-10-CM

## 2016-02-08 DIAGNOSIS — E860 Dehydration: Secondary | ICD-10-CM | POA: Diagnosis present

## 2016-02-08 DIAGNOSIS — Z79899 Other long term (current) drug therapy: Secondary | ICD-10-CM

## 2016-02-08 DIAGNOSIS — R569 Unspecified convulsions: Secondary | ICD-10-CM | POA: Diagnosis present

## 2016-02-08 DIAGNOSIS — E871 Hypo-osmolality and hyponatremia: Secondary | ICD-10-CM | POA: Diagnosis present

## 2016-02-08 DIAGNOSIS — E1042 Type 1 diabetes mellitus with diabetic polyneuropathy: Secondary | ICD-10-CM | POA: Diagnosis present

## 2016-02-08 DIAGNOSIS — F4312 Post-traumatic stress disorder, chronic: Secondary | ICD-10-CM | POA: Diagnosis present

## 2016-02-08 DIAGNOSIS — E785 Hyperlipidemia, unspecified: Secondary | ICD-10-CM | POA: Diagnosis present

## 2016-02-08 DIAGNOSIS — I10 Essential (primary) hypertension: Secondary | ICD-10-CM | POA: Diagnosis present

## 2016-02-08 DIAGNOSIS — Z91013 Allergy to seafood: Secondary | ICD-10-CM

## 2016-02-08 DIAGNOSIS — Z7984 Long term (current) use of oral hypoglycemic drugs: Secondary | ICD-10-CM

## 2016-02-08 DIAGNOSIS — F329 Major depressive disorder, single episode, unspecified: Secondary | ICD-10-CM | POA: Diagnosis present

## 2016-02-08 DIAGNOSIS — D649 Anemia, unspecified: Secondary | ICD-10-CM | POA: Diagnosis present

## 2016-02-08 DIAGNOSIS — N179 Acute kidney failure, unspecified: Secondary | ICD-10-CM | POA: Diagnosis present

## 2016-02-08 LAB — CBC
HCT: 40 % (ref 35.0–47.0)
HEMOGLOBIN: 13.3 g/dL (ref 12.0–16.0)
MCH: 29.5 pg (ref 26.0–34.0)
MCHC: 33.2 g/dL (ref 32.0–36.0)
MCV: 88.7 fL (ref 80.0–100.0)
Platelets: 306 10*3/uL (ref 150–440)
RBC: 4.51 MIL/uL (ref 3.80–5.20)
RDW: 14 % (ref 11.5–14.5)
WBC: 10.2 10*3/uL (ref 3.6–11.0)

## 2016-02-08 LAB — URINALYSIS COMPLETE WITH MICROSCOPIC (ARMC ONLY)
Bilirubin Urine: NEGATIVE
Leukocytes, UA: NEGATIVE
Nitrite: NEGATIVE
PROTEIN: NEGATIVE mg/dL
SPECIFIC GRAVITY, URINE: 1.02 (ref 1.005–1.030)
pH: 5 (ref 5.0–8.0)

## 2016-02-08 LAB — BASIC METABOLIC PANEL
ANION GAP: 20 — AB (ref 5–15)
ANION GAP: 8 (ref 5–15)
Anion gap: 7 (ref 5–15)
BUN: 36 mg/dL — ABNORMAL HIGH (ref 6–20)
BUN: 26 mg/dL — AB (ref 6–20)
BUN: 25 mg/dL — AB (ref 6–20)
CALCIUM: 10 mg/dL (ref 8.9–10.3)
CALCIUM: 8.2 mg/dL — AB (ref 8.9–10.3)
CALCIUM: 8.6 mg/dL — AB (ref 8.9–10.3)
CHLORIDE: 87 mmol/L — AB (ref 101–111)
CO2: 15 mmol/L — AB (ref 22–32)
CO2: 19 mmol/L — AB (ref 22–32)
CO2: 21 mmol/L — AB (ref 22–32)
CREATININE: 1.43 mg/dL — AB (ref 0.44–1.00)
CREATININE: 0.86 mg/dL (ref 0.44–1.00)
CREATININE: 0.72 mg/dL (ref 0.44–1.00)
Chloride: 106 mmol/L (ref 101–111)
Chloride: 105 mmol/L (ref 101–111)
GFR calc Af Amer: 47 mL/min — ABNORMAL LOW (ref >60)
GFR calc Af Amer: >60 mL/min (ref >60)
GFR calc Af Amer: >60 mL/min (ref >60)
GFR calc non Af Amer: 41 mL/min — ABNORMAL LOW (ref >60)
GLUCOSE: 674 mg/dL — AB (ref 65–99)
GLUCOSE: 224 mg/dL — AB (ref 65–99)
GLUCOSE: 188 mg/dL — AB (ref 65–99)
Potassium: 5.4 mmol/L — ABNORMAL HIGH (ref 3.5–5.1)
Potassium: 4.1 mmol/L (ref 3.5–5.1)
Potassium: 4 mmol/L (ref 3.5–5.1)
Sodium: 122 mmol/L — ABNORMAL LOW (ref 135–145)
Sodium: 133 mmol/L — ABNORMAL LOW (ref 135–145)
Sodium: 133 mmol/L — ABNORMAL LOW (ref 135–145)

## 2016-02-08 LAB — GLUCOSE, CAPILLARY
GLUCOSE-CAPILLARY: 375 mg/dL — AB (ref 65–99)
GLUCOSE-CAPILLARY: 171 mg/dL — AB (ref 65–99)
GLUCOSE-CAPILLARY: 190 mg/dL — AB (ref 65–99)
GLUCOSE-CAPILLARY: 149 mg/dL — AB (ref 65–99)
Glucose-Capillary: 129 mg/dL — ABNORMAL HIGH (ref 65–99)
Glucose-Capillary: 167 mg/dL — ABNORMAL HIGH (ref 65–99)
Glucose-Capillary: 174 mg/dL — ABNORMAL HIGH (ref 65–99)
Glucose-Capillary: 197 mg/dL — ABNORMAL HIGH (ref 65–99)
Glucose-Capillary: 234 mg/dL — ABNORMAL HIGH (ref 65–99)
Glucose-Capillary: 242 mg/dL — ABNORMAL HIGH (ref 65–99)
Glucose-Capillary: >600 mg/dL (ref 65–99)

## 2016-02-08 LAB — MRSA PCR SCREENING: MRSA BY PCR: NEGATIVE

## 2016-02-08 LAB — LIPASE, BLOOD: LIPASE: 44 U/L (ref 11–51)

## 2016-02-08 LAB — TROPONIN I

## 2016-02-08 MED ORDER — PNEUMOCOCCAL VAC POLYVALENT 25 MCG/0.5ML IJ INJ
0.5000 mL | INJECTION | INTRAMUSCULAR | Status: AC
  Administered 2016-02-09: 0.5 mL via INTRAMUSCULAR
  Filled 2016-02-08: qty 0.5

## 2016-02-08 MED ORDER — AMLODIPINE BESYLATE 5 MG PO TABS
5.0000 mg | ORAL_TABLET | Freq: Every day | ORAL | Status: DC
  Administered 2016-02-08 – 2016-02-10 (×3): 5 mg via ORAL
  Filled 2016-02-08 (×3): qty 1

## 2016-02-08 MED ORDER — SODIUM CHLORIDE 0.9 % IV SOLN
INTRAVENOUS | Status: DC
  Administered 2016-02-08: 3.2 [IU]/h via INTRAVENOUS
  Filled 2016-02-08: qty 2.5

## 2016-02-08 MED ORDER — SODIUM CHLORIDE 0.9 % IV BOLUS (SEPSIS)
1000.0000 mL | Freq: Once | INTRAVENOUS | Status: AC
  Administered 2016-02-08: 1000 mL via INTRAVENOUS

## 2016-02-08 MED ORDER — SODIUM CHLORIDE 0.9 % IV SOLN
INTRAVENOUS | Status: AC
  Administered 2016-02-08: 15:00:00 via INTRAVENOUS

## 2016-02-08 MED ORDER — ONDANSETRON HCL 4 MG PO TABS: 4.0000 mg | ORAL_TABLET | Freq: Four times a day (QID) | ORAL | Status: DC | PRN

## 2016-02-08 MED ORDER — ACETAMINOPHEN 650 MG RE SUPP: 650.0000 mg | Freq: Four times a day (QID) | RECTAL | Status: DC | PRN

## 2016-02-08 MED ORDER — ALBUTEROL SULFATE (2.5 MG/3ML) 0.083% IN NEBU: 3.0000 mL | INHALATION_SOLUTION | RESPIRATORY_TRACT | Status: DC | PRN

## 2016-02-08 MED ORDER — BREXPIPRAZOLE 1 MG PO TABS
4.0000 mg | ORAL_TABLET | Freq: Every day | ORAL | Status: DC
  Administered 2016-02-09 – 2016-02-10 (×2): 4 mg via ORAL
  Filled 2016-02-08 (×2): qty 1

## 2016-02-08 MED ORDER — DEXTROSE-NACL 5-0.45 % IV SOLN
INTRAVENOUS | Status: DC
  Administered 2016-02-08: 17:00:00 via INTRAVENOUS

## 2016-02-08 MED ORDER — SODIUM CHLORIDE 0.9 % IV SOLN
INTRAVENOUS | Status: DC
  Administered 2016-02-09 – 2016-02-10 (×2): via INTRAVENOUS

## 2016-02-08 MED ORDER — TRAMADOL HCL 50 MG PO TABS
50.0000 mg | ORAL_TABLET | Freq: Four times a day (QID) | ORAL | Status: DC | PRN
  Administered 2016-02-08: 50 mg via ORAL
  Filled 2016-02-08: qty 1

## 2016-02-08 MED ORDER — SERTRALINE HCL 100 MG PO TABS
200.0000 mg | ORAL_TABLET | Freq: Every day | ORAL | Status: DC
  Administered 2016-02-08 – 2016-02-10 (×3): 200 mg via ORAL
  Filled 2016-02-08 (×3): qty 2

## 2016-02-08 MED ORDER — ACETAMINOPHEN 325 MG PO TABS: 650.0000 mg | ORAL_TABLET | Freq: Four times a day (QID) | ORAL | Status: DC | PRN

## 2016-02-08 MED ORDER — SODIUM CHLORIDE 0.9% FLUSH
3.0000 mL | Freq: Two times a day (BID) | INTRAVENOUS | Status: DC
  Administered 2016-02-08 – 2016-02-09 (×2): 3 mL via INTRAVENOUS

## 2016-02-08 MED ORDER — INSULIN ASPART 100 UNIT/ML ~~LOC~~ SOLN
8.0000 [IU] | Freq: Once | SUBCUTANEOUS | Status: AC
  Administered 2016-02-08: 8 [IU] via INTRAVENOUS
  Filled 2016-02-08: qty 8

## 2016-02-08 MED ORDER — ATORVASTATIN CALCIUM 20 MG PO TABS
40.0000 mg | ORAL_TABLET | Freq: Every day | ORAL | Status: DC
  Administered 2016-02-08 – 2016-02-09 (×2): 40 mg via ORAL
  Filled 2016-02-08 (×2): qty 2

## 2016-02-08 MED ORDER — BREXPIPRAZOLE 4 MG PO TABS: 4.0000 mg | ORAL_TABLET | Freq: Every day | ORAL | Status: DC

## 2016-02-08 MED ORDER — TRAZODONE HCL 100 MG PO TABS
100.0000 mg | ORAL_TABLET | Freq: Every day | ORAL | Status: DC
  Administered 2016-02-08 – 2016-02-09 (×2): 100 mg via ORAL
  Filled 2016-02-08: qty 2
  Filled 2016-02-08: qty 1

## 2016-02-08 MED ORDER — HEPARIN SODIUM (PORCINE) 5000 UNIT/ML IJ SOLN
5000.0000 [IU] | Freq: Three times a day (TID) | INTRAMUSCULAR | Status: DC
  Administered 2016-02-08 – 2016-02-10 (×5): 5000 [IU] via SUBCUTANEOUS
  Filled 2016-02-08 (×5): qty 1

## 2016-02-08 MED ORDER — ONDANSETRON HCL 4 MG/2ML IJ SOLN: 4.0000 mg | Freq: Four times a day (QID) | INTRAMUSCULAR | Status: DC | PRN

## 2016-02-08 NOTE — ED Notes (Signed)
Pt c/o dizziness/off balance for the past 2 days with glucometer reading high at home.Allison Cooke

## 2016-02-08 NOTE — Telephone Encounter (Signed)
I see that she is in the hospital; I just sent a note to the admitting attending asking him to have social work see the patient while hospitalized, because she may qualify for even more help with her out of control sugars (like a home health nurse) in addition to the personal care services aide Unfortunately, we are not permitted to talk tot he daughter per the FYI listed, so please call the PATIENT and explain the above; she is welcome to have the hospital social worker call me once they talk

## 2016-02-08 NOTE — ED Provider Notes (Signed)
Time Seen: Approximately 1210  I have reviewed the triage notes  Chief Complaint: Hyperglycemia   History of Present Illness: Allison Cooke is a 55 y.o. female who has a history of insulin-dependent diabetes and was recently seen and evaluated here for a persistent hyperglycemia. Patient states she has been taking her medications and his had and adjustments of both her oral medication along with her subcutaneous insulin. The patient states that she still is been running consistently high blood sugars at home with frequent urination. She denies any fever. She only describes some epigastric abdominal pain with a decreased appetite. She denies any chest pain, arm pain, or jaw pain. She states no productive cough or wheezing. She's not noticed any new skin lesions.   Past Medical History  Diagnosis Date  . Hypertension   . SOB (shortness of breath)   . Heart murmur   . Headache   . Arthritis   . Anemia   . Seizures ([place])     as a child  . Diabetes mellitus without complication ([place])   . Chronic post-traumatic stress disorder (PTSD) 01/29/2016  . Lumbar back pain 02/03/2016    Patient Active Problem List   Diagnosis Date Noted  . DKA (diabetic ketoacidoses) ([place]) 02/08/2016  . Lumbar back pain 02/03/2016  . Chronic post-traumatic stress disorder (PTSD) 01/29/2016  . Right anterior knee pain 08/03/2015  . Type 2 diabetes mellitus ([place]) 06/28/2015  . Abnormal EKG 04/22/2015  . Dysphagia 03/30/2015  . Medication monitoring encounter 03/30/2015  . Essential hypertension, benign 03/02/2015  . Depression, major, recurrent ([place]) 03/02/2015  . Tobacco abuse 03/02/2015  . Chronic dermatitis 03/02/2015  . Hoarseness of voice 03/02/2015    Past Surgical History  Procedure Laterality Date  . Cholecystectomy    . Cesarean section    . Fine needle aspiration Left     breast cyst, benign, age 78?  Marland Kitchen Breast surgery Left   . Direct laryngoscopy N/A 04/26/2015    Procedure:  Microlaryngoscopy with exxcision of vocal cord polyp ;  Surgeon: Beverly Gust, MD;  Location: [facility] ORS;  Service: ENT;  Laterality: N/A;  . Breast lumpectomy Left 07/14/2015    Procedure: BREAST LUMPECTOMY;  Surgeon: Christene Lye, MD;  Location: [facility] ORS;  Service: General;  Laterality: Left;    Past Surgical History  Procedure Laterality Date  . Cholecystectomy    . Cesarean section    . Fine needle aspiration Left     breast cyst, benign, age 18?  Marland Kitchen Breast surgery Left   . Direct laryngoscopy N/A 04/26/2015    Procedure: Microlaryngoscopy with exxcision of vocal cord polyp ;  Surgeon: Beverly Gust, MD;  Location: [facility] ORS;  Service: ENT;  Laterality: N/A;  . Breast lumpectomy Left 07/14/2015    Procedure: BREAST LUMPECTOMY;  Surgeon: Christene Lye, MD;  Location: [facility] ORS;  Service: General;  Laterality: Left;    Current Outpatient Rx  Name  Route  Sig  Dispense  Refill  . albuterol (PROVENTIL HFA;VENTOLIN HFA) 108 (90 Base) MCG/ACT inhaler   Inhalation   Inhale 2 puffs into the lungs every 4 (four) hours as needed for wheezing or shortness of breath.   1 Inhaler   2   . amLODipine (NORVASC) 5 MG tablet   Oral   Take 1 tablet (5 mg total) by mouth daily.   30 tablet   5   . atorvastatin (LIPITOR) 40 MG tablet   Oral   Take 1 tablet (40 mg  total) by mouth at bedtime.   30 tablet   2   . Brexpiprazole (REXULTI) 4 MG TABS   Oral   Take 4 mg by mouth daily.         . insulin aspart (NOVOLOG) 100 UNIT/ML injection   Subcutaneous   Inject 9 Units into the skin 3 (three) times daily with meals.   10 mL   5     New instructions, cancel other refills   . insulin detemir (LEVEMIR) 100 UNIT/ML injection   Subcutaneous   Inject 0.3 mLs (30 Units total) into the skin every morning.   10 mL   1     New instructions, cancel the old   . lisinopril (PRINIVIL,ZESTRIL) 10 MG tablet   Oral   Take 1 tablet (10 mg total) by mouth daily.   30 tablet    1   . metFORMIN (GLUCOPHAGE) 500 MG tablet   Oral   Take 2 tablets (1,000 mg total) by mouth 2 (two) times daily with a meal.   120 tablet   5   . sertraline (ZOLOFT) 100 MG tablet   Oral   Take 2 tablets (200 mg total) by mouth daily.   60 tablet   0   . traMADol (ULTRAM) 50 MG tablet   Oral   Take 1 tablet (50 mg total) by mouth every 6 (six) hours as needed for moderate pain.   40 tablet   0   . traZODone (DESYREL) 100 MG tablet   Oral   Take 100 mg by mouth at bedtime.         . B-D INS SYR ULTRAFINE 1CC/31G 31G X 5/16" 1 ML MISC      use at bedtime as directed WITH LEVEMIR   100 each   0     Dispense as written.   Marland Kitchen glucose blood (ACCU-CHEK AVIVA) test strip      Check fingerstick blood sugars three times a day; E11.65, LON 99 months; insulin-dependent; Accu-check Aviva Plus strips   100 each   12   . Insulin Pen Needle (PEN NEEDLES) 32G X 4 MM MISC   Does not apply   1 Units by Does not apply route 2 (two) times daily.   100 each   1     Allergies:  Shrimp  Family History: Family History  Problem Relation Age of Onset  . Hypertension Mother   . Heart disease Mother   . Hyperlipidemia Mother   . Diabetes Mother   . Heart disease Father   . Hyperlipidemia Father   . Hypertension Father   . Breast cancer Cousin   . Diabetes Sister   . Hypertension Daughter   . Diabetes Maternal Grandmother   . Diabetes Sister   . Cancer Neg Hx   . Stroke Neg Hx   . COPD Neg Hx     Social History: Social History  Substance Use Topics  . Smoking status: Current Some Day Smoker -- 0.25 packs/day for 20 years    Types: Cigarettes  . Smokeless tobacco: Never Used  . Alcohol Use: No     Review of Systems:   10 point review of systems was performed and was otherwise negative:  Constitutional: No fever Eyes: No visual disturbances ENT: No sore throat, ear pain Cardiac: No chest pain Respiratory: No shortness of breath, wheezing, or stridor Abdomen:  Epigastric abdominal pain with nausea and no vomiting or diarrhea Endocrine: No weight loss, No night sweats Extremities:  No peripheral edema, cyanosis Skin: No rashes, easy bruising Neurologic: No focal weakness, trouble with speech or swollowing Urologic: No dysuria, Hematuria, or urinary frequency   Physical Exam:  ED Triage Vitals  Enc Vitals Group     BP 02/08/16 1142 132/84 mmHg     Pulse Rate 02/08/16 1142 96     Resp 02/08/16 1142 18     Temp 02/08/16 1142 98.2 F (36.8 C)     Temp Source 02/08/16 1142 Oral     SpO2 02/08/16 1142 99 %     Weight 02/08/16 1142 170 lb (77.111 kg)     Height 02/08/16 1142 5\' 3"  (1.6 m)     Head Cir --      Peak Flow --      Pain Score 02/08/16 1146 0     Pain Loc --      Pain Edu? --      Excl. in [place]? --     General: Awake , Alert , and Oriented times 3; GCS 15 Head: Normal cephalic , atraumatic Eyes: Pupils equal , round, reactive to light Nose/Throat: No nasal drainage, patent upper airway without erythema or exudate. Ketotic breath. Neck: Supple, Full range of motion, No anterior adenopathy or palpable thyroid masses Lungs: Clear to ascultation without wheezes , rhonchi, or rales Heart: Regular rate, regular rhythm without murmurs , gallops , or rubs Abdomen: Soft, non tender without rebound, guarding , or rigidity; bowel sounds positive and symmetric in all 4 quadrants. No organomegaly .        Extremities: 2 plus symmetric pulses. No edema, clubbing or cyanosis Neurologic: normal ambulation, Motor symmetric without deficits, sensory intact Skin: warm, dry, no rashes   Labs:   All laboratory work was reviewed including any pertinent negatives or positives listed below:  Labs Reviewed  GLUCOSE, CAPILLARY - Abnormal; Notable for the following:    Glucose-Capillary >600 (*)    All other components within normal limits  BASIC METABOLIC PANEL - Abnormal; Notable for the following:    Sodium 122 (*)    Potassium 5.4 (*)     Chloride 87 (*)    CO2 15 (*)    Glucose, Bld 674 (*)    BUN 36 (*)    Creatinine, Ser 1.43 (*)    GFR calc non Af Amer 41 (*)    GFR calc Af Amer 47 (*)    Anion gap 20 (*)    All other components within normal limits  URINALYSIS COMPLETEWITH MICROSCOPIC ([facility] ONLY) - Abnormal; Notable for the following:    Color, Urine STRAW (*)    APPearance CLEAR (*)    Glucose, UA >500 (*)    Ketones, ur 1+ (*)    Hgb urine dipstick 1+ (*)    Bacteria, UA RARE (*)    Squamous Epithelial / LPF 0-5 (*)    All other components within normal limits  GLUCOSE, CAPILLARY - Abnormal; Notable for the following:    Glucose-Capillary 375 (*)    All other components within normal limits  CBC  LIPASE, BLOOD  TROPONIN I  BASIC METABOLIC PANEL  BASIC METABOLIC PANEL  BASIC METABOLIC PANEL  BASIC METABOLIC PANEL  CBG MONITORING, ED  Patient presents with objective findings consistent with diabetic ketoacidosis  EKG:  ED ECG REPORT I, Daymon Larsen, the attending physician, personally viewed and interpreted this ECG.  Date: 02/08/2016 EKG Time: 1402 Rate: *100 Rhythm: normal sinus rhythm QRS Axis: normal Intervals: normal ST/T Wave abnormalities:  normal Conduction Disturbances: none Narrative Interpretation: unremarkable No acute ischemic changes    Critical Care:  CRITICAL CARE Performed by: Daymon Larsen   Total critical care time: 43 minutes  Critical care time was exclusive of separately billable procedures and treating other patients.  Critical care was necessary to treat or prevent imminent or life-threatening deterioration.  Critical care was time spent personally by me on the following activities: development of treatment plan with patient and/or surrogate as well as nursing, discussions with consultants, evaluation of patient's response to treatment, examination of patient, obtaining history from patient or surrogate, ordering and performing treatments and interventions,  ordering and review of laboratory studies, ordering and review of radiographic studies, pulse oximetry and re-evaluation of patient's condition. Initial evaluation treatment of diabetic ketoacidosis    ED Course: * Patient was started on IV fluid bolus along with IV insulin therapy. Serial blood sugars showed a decrease in her total blood sugar and she received 2 L of normal saline here in emergency department. Patient's otherwise hemodynamically stable though has findings of renal insufficiency. Patient's case was reviewed with the hospitalist team, further disposition and management depends upon her evaluation.    Assessment: * Acute diabetic ketoacidosis   Final Clinical Impression:  Final diagnoses:  Diabetic ketoacidosis without coma associated with type 1 diabetes mellitus [facility])     Plan:  Inpatient            Daymon Larsen, MD 02/08/16 1538

## 2016-02-08 NOTE — H&P (Signed)
[facility] NAME: Allison Cooke    MR#:  [id number]  DATE OF BIRTH:  Apr 29, 1961  DATE OF ADMISSION:  02/08/2016  PRIMARY CARE PHYSICIAN: Enid Derry, MD   REQUESTING/REFERRING PHYSICIAN: Dr. Meade Maw  CHIEF COMPLAINT:   Chief Complaint  Patient presents with  . Hyperglycemia    HISTORY OF PRESENT ILLNESS:  Allison Cooke  is a 55 y.o. female with a known history of Hypertension, diabetes, postraumatic stress disorder, osteoarthritis, who presented to the hospital due to uncontrolled blood sugars and also having numbness and tingling in her upper and lower extremities. Patient says that she has been compliant with her insulin at home and a primary care physician has been increasing her dose of insulin as her blood sugars have been high. Her last hemoglobin A1c was 7.5. She presents to the ER and was noted to have blood sugars greater than 600 with an elevated anion gap at noted to be in acute diabetic ketoacidosis. Hospitalist services were contacted for admission. Patient denies any fevers, chills, cough, nausea, vomiting, abdominal pain or any other associated symptoms presently.  PAST MEDICAL HISTORY:   Past Medical History  Diagnosis Date  . Hypertension   . SOB (shortness of breath)   . Heart murmur   . Headache   . Arthritis   . Anemia   . Seizures ([place])     as a child  . Diabetes mellitus without complication ([place])   . Chronic post-traumatic stress disorder (PTSD) 01/29/2016  . Lumbar back pain 02/03/2016    PAST SURGICAL HISTORY:   Past Surgical History  Procedure Laterality Date  . Cholecystectomy    . Cesarean section    . Fine needle aspiration Left     breast cyst, benign, age 25?  Marland Kitchen Breast surgery Left   . Direct laryngoscopy N/A 04/26/2015    Procedure: Microlaryngoscopy with exxcision of vocal cord polyp ;  Surgeon: Beverly Gust, MD;  Location: [facility] ORS;  Service: ENT;  Laterality: N/A;  . Breast  lumpectomy Left 07/14/2015    Procedure: BREAST LUMPECTOMY;  Surgeon: Christene Lye, MD;  Location: [facility] ORS;  Service: General;  Laterality: Left;    SOCIAL HISTORY:   Social History  Substance Use Topics  . Smoking status: Current Some Day Smoker -- 0.25 packs/day for 20 years    Types: Cigarettes  . Smokeless tobacco: Never Used  . Alcohol Use: No    FAMILY HISTORY:   Family History  Problem Relation Age of Onset  . Hypertension Mother   . Heart disease Mother   . Hyperlipidemia Mother   . Diabetes Mother   . Heart disease Father   . Hyperlipidemia Father   . Hypertension Father   . Breast cancer Cousin   . Diabetes Sister   . Hypertension Daughter   . Diabetes Maternal Grandmother   . Diabetes Sister   . Cancer Neg Hx   . Stroke Neg Hx   . COPD Neg Hx     DRUG ALLERGIES:   Allergies  Allergen Reactions  . Shrimp [Shellfish Allergy] Hives and Itching    REVIEW OF SYSTEMS:   Review of Systems  Constitutional: Negative for fever and weight loss.  HENT: Negative for congestion, nosebleeds and tinnitus.   Eyes: Negative for blurred vision, double vision and redness.  Respiratory: Negative for cough, hemoptysis and shortness of breath.   Cardiovascular: Negative for chest pain, orthopnea, leg swelling and PND.  Gastrointestinal: Negative  for nausea, vomiting, abdominal pain, diarrhea and melena.  Genitourinary: Negative for dysuria, urgency and hematuria.  Musculoskeletal: Negative for joint pain and falls.  Neurological: Negative for dizziness, tingling, sensory change, focal weakness, seizures, weakness and headaches.  Endo/Heme/Allergies: Negative for polydipsia. Does not bruise/bleed easily.  Psychiatric/Behavioral: Negative for depression and memory loss. The patient is not nervous/anxious.     MEDICATIONS AT HOME:   Prior to Admission medications   Medication Sig Start Date End Date Taking? Authorizing Provider  albuterol (PROVENTIL  HFA;VENTOLIN HFA) 108 (90 Base) MCG/ACT inhaler Inhale 2 puffs into the lungs every 4 (four) hours as needed for wheezing or shortness of breath. 01/04/16  Yes Arnetha Courser, MD  amLODipine (NORVASC) 5 MG tablet Take 1 tablet (5 mg total) by mouth daily. 01/04/16  Yes Arnetha Courser, MD  atorvastatin (LIPITOR) 40 MG tablet Take 1 tablet (40 mg total) by mouth at bedtime. 01/04/16  Yes Arnetha Courser, MD  Brexpiprazole (REXULTI) 4 MG TABS Take 4 mg by mouth daily.   Yes Historical Provider, MD  insulin aspart (NOVOLOG) 100 UNIT/ML injection Inject 9 Units into the skin 3 (three) times daily with meals. 02/03/16  Yes Arnetha Courser, MD  insulin detemir (LEVEMIR) 100 UNIT/ML injection Inject 0.3 mLs (30 Units total) into the skin every morning. 01/26/16  Yes Arnetha Courser, MD  lisinopril (PRINIVIL,ZESTRIL) 10 MG tablet Take 1 tablet (10 mg total) by mouth daily. 01/04/16  Yes Arnetha Courser, MD  metFORMIN (GLUCOPHAGE) 500 MG tablet Take 2 tablets (1,000 mg total) by mouth 2 (two) times daily with a meal. 01/04/16  Yes Arnetha Courser, MD  sertraline (ZOLOFT) 100 MG tablet Take 2 tablets (200 mg total) by mouth daily. 11/02/15  Yes Arnetha Courser, MD  traMADol (ULTRAM) 50 MG tablet Take 1 tablet (50 mg total) by mouth every 6 (six) hours as needed for moderate pain. 02/03/16  Yes Arnetha Courser, MD  traZODone (DESYREL) 100 MG tablet Take 100 mg by mouth at bedtime.   Yes Historical Provider, MD  B-D INS SYR ULTRAFINE 1CC/31G 31G X 5/16" 1 ML MISC use at bedtime as directed WITH LEVEMIR 01/04/16   Arnetha Courser, MD  glucose blood (ACCU-CHEK AVIVA) test strip Check fingerstick blood sugars three times a day; E11.65, LON 99 months; insulin-dependent; Accu-check Aviva Plus strips 01/04/16   Arnetha Courser, MD  Insulin Pen Needle (PEN NEEDLES) 32G X 4 MM MISC 1 Units by Does not apply route 2 (two) times daily. 07/11/15   Arnetha Courser, MD      VITAL SIGNS:  Blood pressure 132/84, pulse 96, temperature 98.2 F (36.8  C), temperature source Oral, resp. rate 18, height 5\' 3"  (1.6 m), weight 77.111 kg (170 lb), last menstrual period 04/29/2015, SpO2 99 %.  PHYSICAL EXAMINATION:  Physical Exam  GENERAL:  55 y.o.-year-old patient lying in the bed in no acute distress.  EYES: Pupils equal, round, reactive to light and accommodation. No scleral icterus. Extraocular muscles intact.  HEENT: Head atraumatic, normocephalic. Oropharynx and nasopharynx clear. No oropharyngeal erythema, moist oral mucosa  NECK:  Supple, no jugular venous distention. No thyroid enlargement, no tenderness.  LUNGS: Normal breath sounds bilaterally, no wheezing, rales, rhonchi. No use of accessory muscles of respiration.  CARDIOVASCULAR: S1, S2 RRR. No murmurs, rubs, gallops, clicks.  ABDOMEN: Soft, nontender, nondistended. Bowel sounds present. No organomegaly or mass.  EXTREMITIES: No pedal edema, cyanosis, or clubbing. + 2 pedal & radial pulses  b/l.   NEUROLOGIC: Cranial nerves II through XII are intact. No focal Motor or sensory deficits appreciated b/l PSYCHIATRIC: The patient is alert and oriented x 3. Good affect.  SKIN: No obvious rash, lesion, or ulcer.   LABORATORY PANEL:   CBC  Recent Labs Lab 02/08/16 1205  WBC 10.2  HGB 13.3  HCT 40.0  PLT 306   ------------------------------------------------------------------------------------------------------------------  Chemistries   Recent Labs Lab 02/08/16 1205  NA 122*  K 5.4*  CL 87*  CO2 15*  GLUCOSE 674*  BUN 36*  CREATININE 1.43*  CALCIUM 10.0   ------------------------------------------------------------------------------------------------------------------  Cardiac Enzymes  Recent Labs Lab 02/08/16 1205  TROPONINI <0.03   ------------------------------------------------------------------------------------------------------------------  RADIOLOGY:  No results found.   IMPRESSION AND PLAN:   55 year old female with past medical history  hypertension, diabetes, posttraumatic stress disorder, osteoarthritis, history of seizures, who presented to the hospital with acute diabetic ketoacidosis.  1. Acute diabetic ketoacidosis-we'll start the patient on IV insulin drip, follow serial metabolic profiles. -Replace electrolytes as needed. Check hemoglobin A1c. -Exact source of the DKA is unclear and likely infectious, questionable related to noncompliance. - will get Endocrinology consult.    2. Hyponatremia - likely Pseudohyponatremia due to Hyperglycemia.  - will improve as sugars corrects.    3. ARF - likely dehydration due to DKA.  - will hydrate with with IV fluids and will follow.   4. Hyperkalemia - due to ARF.  - cont. IV fluids and should correct as renal function improves.   5. HTN - hold Lisinopril, cont. Norvasc.   6. Depression - cont. Zoloft.   7. Hyperlipidemia - cont. Atorvastatin.      All the records are reviewed and case discussed with ED provider. Management plans discussed with the patient, family and they are in agreement.  CODE STATUS: Full  TOTAL TIME TAKING CARE OF THIS PATIENT: 45 minutes.    Henreitta Leber M.D on 02/08/2016 at 3:01 PM  Between 7am to 6pm - Pager - [phone]  After 6pm go to [email or web address] - password [email or web address] [facility]  Office  [phone]  CC: Primary care physician; Enid Derry, MD

## 2016-02-08 NOTE — Progress Notes (Signed)
   02/08/16 1900  Clinical Encounter Type  Visited With Patient  Visit Type Initial  Referral From Nurse  Consult/Referral To Chaplain  Spiritual Encounters  Spiritual Needs Prayer  Stress Factors  Patient Stress Factors Exhausted;Health changes

## 2016-02-08 NOTE — Telephone Encounter (Signed)
Was asked on Friday if she needed someone to come in and help her at home and she stated no. Since appointment patient has changed her mind. She would like for you to write a note stating the she does need help due to her knees and legs being weak and wobbly. Would like to know once you write it is it okay for her daughter Allison Cooke) can pick up the letter.

## 2016-02-09 LAB — CBC
HCT: 32.2 % — ABNORMAL LOW (ref 35.0–47.0)
HEMOGLOBIN: 11.3 g/dL — AB (ref 12.0–16.0)
MCH: 30 pg (ref 26.0–34.0)
MCHC: 35.1 g/dL (ref 32.0–36.0)
MCV: 85.4 fL (ref 80.0–100.0)
Platelets: 230 10*3/uL (ref 150–440)
RBC: 3.77 MIL/uL — AB (ref 3.80–5.20)
RDW: 14.1 % (ref 11.5–14.5)
WBC: 8 10*3/uL (ref 3.6–11.0)

## 2016-02-09 LAB — BASIC METABOLIC PANEL
ANION GAP: 3 — AB (ref 5–15)
Anion gap: 7 (ref 5–15)
Anion gap: 7 (ref 5–15)
BUN: 20 mg/dL (ref 6–20)
BUN: 20 mg/dL (ref 6–20)
BUN: 19 mg/dL (ref 6–20)
CALCIUM: 8.6 mg/dL — AB (ref 8.9–10.3)
CHLORIDE: 106 mmol/L (ref 101–111)
CHLORIDE: 105 mmol/L (ref 101–111)
CO2: 22 mmol/L (ref 22–32)
CO2: 24 mmol/L (ref 22–32)
CO2: 22 mmol/L (ref 22–32)
CREATININE: 0.76 mg/dL (ref 0.44–1.00)
Calcium: 8.8 mg/dL — ABNORMAL LOW (ref 8.9–10.3)
Calcium: 8.9 mg/dL (ref 8.9–10.3)
Chloride: 106 mmol/L (ref 101–111)
Creatinine, Ser: 0.8 mg/dL (ref 0.44–1.00)
Creatinine, Ser: 0.75 mg/dL (ref 0.44–1.00)
GFR calc Af Amer: >60 mL/min (ref >60)
GFR calc non Af Amer: >60 mL/min (ref >60)
GFR calc non Af Amer: >60 mL/min (ref >60)
GLUCOSE: 205 mg/dL — AB (ref 65–99)
Glucose, Bld: 143 mg/dL — ABNORMAL HIGH (ref 65–99)
Glucose, Bld: 289 mg/dL — ABNORMAL HIGH (ref 65–99)
POTASSIUM: 4.5 mmol/L (ref 3.5–5.1)
Potassium: 3.8 mmol/L (ref 3.5–5.1)
Potassium: 3.9 mmol/L (ref 3.5–5.1)
SODIUM: 135 mmol/L (ref 135–145)
Sodium: 133 mmol/L — ABNORMAL LOW (ref 135–145)
Sodium: 134 mmol/L — ABNORMAL LOW (ref 135–145)

## 2016-02-09 LAB — GLUCOSE, CAPILLARY
GLUCOSE-CAPILLARY: 211 mg/dL — AB (ref 65–99)
GLUCOSE-CAPILLARY: 197 mg/dL — AB (ref 65–99)
GLUCOSE-CAPILLARY: 331 mg/dL — AB (ref 65–99)
GLUCOSE-CAPILLARY: 269 mg/dL — AB (ref 65–99)
GLUCOSE-CAPILLARY: 246 mg/dL — AB (ref 65–99)
Glucose-Capillary: 150 mg/dL — ABNORMAL HIGH (ref 65–99)
Glucose-Capillary: 305 mg/dL — ABNORMAL HIGH (ref 65–99)
Glucose-Capillary: 194 mg/dL — ABNORMAL HIGH (ref 65–99)
Glucose-Capillary: 164 mg/dL — ABNORMAL HIGH (ref 65–99)
Glucose-Capillary: 196 mg/dL — ABNORMAL HIGH (ref 65–99)

## 2016-02-09 LAB — HEMOGLOBIN A1C

## 2016-02-09 MED ORDER — INSULIN DETEMIR 100 UNIT/ML ~~LOC~~ SOLN: 25.0000 [IU] | Freq: Every day | SUBCUTANEOUS | Status: DC

## 2016-02-09 MED ORDER — INSULIN DETEMIR 100 UNIT/ML ~~LOC~~ SOLN
30.0000 [IU] | Freq: Every day | SUBCUTANEOUS | Status: DC
  Filled 2016-02-09: qty 0.3

## 2016-02-09 MED ORDER — INSULIN ASPART 100 UNIT/ML ~~LOC~~ SOLN
5.0000 [IU] | Freq: Three times a day (TID) | SUBCUTANEOUS | Status: DC
  Administered 2016-02-09: 5 [IU] via SUBCUTANEOUS
  Filled 2016-02-09: qty 5

## 2016-02-09 MED ORDER — INSULIN ASPART 100 UNIT/ML ~~LOC~~ SOLN
0.0000 [IU] | Freq: Three times a day (TID) | SUBCUTANEOUS | Status: DC
  Administered 2016-02-09: 3 [IU] via SUBCUTANEOUS
  Administered 2016-02-09: 11 [IU] via SUBCUTANEOUS
  Filled 2016-02-09: qty 11
  Filled 2016-02-09: qty 3

## 2016-02-09 MED ORDER — INSULIN DETEMIR 100 UNIT/ML ~~LOC~~ SOLN
25.0000 [IU] | Freq: Every day | SUBCUTANEOUS | Status: DC
  Administered 2016-02-09: 25 [IU] via SUBCUTANEOUS
  Filled 2016-02-09 (×2): qty 0.25

## 2016-02-09 MED ORDER — INSULIN ASPART 100 UNIT/ML ~~LOC~~ SOLN
0.0000 [IU] | Freq: Every day | SUBCUTANEOUS | Status: DC
  Filled 2016-02-09: qty 3

## 2016-02-09 MED ORDER — METFORMIN HCL 500 MG PO TABS
1000.0000 mg | ORAL_TABLET | Freq: Two times a day (BID) | ORAL | Status: DC
  Administered 2016-02-09 – 2016-02-10 (×2): 1000 mg via ORAL
  Filled 2016-02-09 (×2): qty 2

## 2016-02-09 MED ORDER — INSULIN ASPART 100 UNIT/ML ~~LOC~~ SOLN
9.0000 [IU] | Freq: Three times a day (TID) | SUBCUTANEOUS | Status: DC
  Administered 2016-02-09 – 2016-02-10 (×3): 9 [IU] via SUBCUTANEOUS
  Filled 2016-02-09 (×3): qty 9

## 2016-02-09 MED ORDER — INSULIN ASPART 100 UNIT/ML ~~LOC~~ SOLN
0.0000 [IU] | Freq: Three times a day (TID) | SUBCUTANEOUS | Status: DC
  Administered 2016-02-09: 3 [IU] via SUBCUTANEOUS

## 2016-02-09 NOTE — Consult Note (Signed)
Endocrine Initial Consult Note Date of Consult: 02/09/2016  Consulting Service: [facility] Endocrinology  Service Requesting Consult: Dr. Verdell Carmine  SUBJECTIVE: Reason for Consultation: uncontrolled type 2 Diabetes  History of Present Illness: Allison Cooke is a 55 y.o. female with PMH HTN, Type 2 DM, PTSD, and osteoarthritis who presented to the ED with severe hyperglycemia, generalized weakness, and numbness/tingling in the extremities. She was admitted to the ICU with DKA. Endocrinology consulted for assistance with hyperglycemia management. She reports diabetes was diagnosed in the past 6 months. It is complicated by neuropathy. Hb A1c 7.5% as of 10/2015. Family history notable for both parents and sisters with diabetes.   Her home regimen includes levemir 30 units daily, Novolog 9 units tidcc, and metformin 1000 mg BID. She reports adherence with her regimen. Her PCP Dr. Sanda Klein manages her diabetes. She reports persistent hyperglycemia at home recently despite taking her medications. She was experiencing polydipsia, polyuria, generalized weakness, abdominal pain, shakiness, and intermittent blurred vision. She denies fevers, chills, nausea, vomiting, diarrhea. No clear trigger has been identified for her DKA. She feels well currently and is tolerating a carb modified diet without issue. BG is ranging in the 300's on the current regimen of Levemir 25 u qhs and novolog 5 units tidcc.     Patient Active Problem List   Diagnosis Date Noted  . DKA (diabetic ketoacidoses) ([place]) 02/08/2016  . Lumbar back pain 02/03/2016  . Chronic post-traumatic stress disorder (PTSD) 01/29/2016  . Right anterior knee pain 08/03/2015  . Type 2 diabetes mellitus ([place]) 06/28/2015  . Abnormal EKG 04/22/2015  . Dysphagia 03/30/2015  . Medication monitoring encounter 03/30/2015  . Essential hypertension, benign 03/02/2015  . Depression, major, recurrent ([place]) 03/02/2015  . Tobacco abuse 03/02/2015  . Chronic  dermatitis 03/02/2015  . Hoarseness of voice 03/02/2015     Past Medical History  Diagnosis Date  . Hypertension   . SOB (shortness of breath)   . Heart murmur   . Headache   . Arthritis   . Anemia   . Seizures ([place])     as a child  . Diabetes mellitus without complication ([place])   . Chronic post-traumatic stress disorder (PTSD) 01/29/2016  . Lumbar back pain 02/03/2016   Past Surgical History  Procedure Laterality Date  . Cholecystectomy    . Cesarean section    . Fine needle aspiration Left     breast cyst, benign, age 27?  Marland Kitchen Breast surgery Left   . Direct laryngoscopy N/A 04/26/2015    Procedure: Microlaryngoscopy with exxcision of vocal cord polyp ;  Surgeon: Beverly Gust, MD;  Location: [facility] ORS;  Service: ENT;  Laterality: N/A;  . Breast lumpectomy Left 07/14/2015    Procedure: BREAST LUMPECTOMY;  Surgeon: Christene Lye, MD;  Location: [facility] ORS;  Service: General;  Laterality: Left;   Family History  Problem Relation Age of Onset  . Hypertension Mother   . Heart disease Mother   . Hyperlipidemia Mother   . Diabetes Mother   . Heart disease Father   . Hyperlipidemia Father   . Hypertension Father   . Breast cancer Cousin   . Diabetes Sister   . Hypertension Daughter   . Diabetes Maternal Grandmother   . Diabetes Sister   . Cancer Neg Hx   . Stroke Neg Hx   . COPD Neg Hx     Social History:  Social History  Substance Use Topics  . Smoking status: Current Some Day Smoker -- 0.25 packs/day  for 20 years    Types: Cigarettes  . Smokeless tobacco: Never Used  . Alcohol Use: No     Allergies  Allergen Reactions  . Shrimp [Shellfish Allergy] Hives and Itching     Medications:  Current Facility-Administered Medications on File Prior to Encounter  Medication Dose Route Frequency Provider Last Rate Last Dose  . albuterol (PROVENTIL) (2.5 MG/3ML) 0.083% nebulizer solution 2.5 mg  2.5 mg Nebulization Once Arnetha Courser, MD   2.5 mg at 03/08/15 [id number]    Current Outpatient Prescriptions on File Prior to Encounter  Medication Sig Dispense Refill  . albuterol (PROVENTIL HFA;VENTOLIN HFA) 108 (90 Base) MCG/ACT inhaler Inhale 2 puffs into the lungs every 4 (four) hours as needed for wheezing or shortness of breath. 1 Inhaler 2  . amLODipine (NORVASC) 5 MG tablet Take 1 tablet (5 mg total) by mouth daily. 30 tablet 5  . atorvastatin (LIPITOR) 40 MG tablet Take 1 tablet (40 mg total) by mouth at bedtime. 30 tablet 2  . Brexpiprazole (REXULTI) 4 MG TABS Take 4 mg by mouth daily.    . insulin aspart (NOVOLOG) 100 UNIT/ML injection Inject 9 Units into the skin 3 (three) times daily with meals. 10 mL 5  . insulin detemir (LEVEMIR) 100 UNIT/ML injection Inject 0.3 mLs (30 Units total) into the skin every morning. 10 mL 1  . lisinopril (PRINIVIL,ZESTRIL) 10 MG tablet Take 1 tablet (10 mg total) by mouth daily. 30 tablet 1  . metFORMIN (GLUCOPHAGE) 500 MG tablet Take 2 tablets (1,000 mg total) by mouth 2 (two) times daily with a meal. 120 tablet 5  . sertraline (ZOLOFT) 100 MG tablet Take 2 tablets (200 mg total) by mouth daily. 60 tablet 0  . traMADol (ULTRAM) 50 MG tablet Take 1 tablet (50 mg total) by mouth every 6 (six) hours as needed for moderate pain. 40 tablet 0  . traZODone (DESYREL) 100 MG tablet Take 100 mg by mouth at bedtime.    . B-D INS SYR ULTRAFINE 1CC/31G 31G X 5/16" 1 ML MISC use at bedtime as directed WITH LEVEMIR 100 each 0  . glucose blood (ACCU-CHEK AVIVA) test strip Check fingerstick blood sugars three times a day; E11.65, LON 99 months; insulin-dependent; Accu-check Aviva Plus strips 100 each 12  . Insulin Pen Needle (PEN NEEDLES) 32G X 4 MM MISC 1 Units by Does not apply route 2 (two) times daily. 100 each 1     Review of Systems: As in HPI. Otherwise 10 pt ROS was negative.  OBJECTIVE: Temp:  [97.8 F (36.6 C)-98.2 F (36.8 C)] 98 F (36.7 C) (07/06 0800) Pulse Rate:  [64-98] 74 (07/06 1200) Resp:  [14-25] 14 (07/06  1200) BP: (92-133)/(62-109) 103/62 mmHg (07/06 1200) SpO2:  [96 %-100 %] 100 % (07/06 1200)  Temp (24hrs), Avg:98 F (36.7 C), Min:97.8 F (36.6 C), Max:98.2 F (36.8 C)  Weight: 77.111 kg (170 lb)  Physical Exam: Gen: no acute distress, well-nourished, well-appearing HEENT: [place]/AT, eyes anicteric, EOMI, mucous membranes moist, no oropharyngeal lesions Neck: no thyroid enlargement or nodules noted, no cervical lymphadenopathy CAD: regular rate, regular rhythm. No murmur rubs or gallops PULM: clear to ausculation, no wheezes, rhonchi or rales. GI: soft, non tender, non distended. EXT: no clubbing, cyanosis or edema, no lesions or ulcerations Skin: warm, dry, no rash Neuro: grossly non focal, normal DTRs, alert and oriented x 3   Labs:  BMP Latest Ref Rng 02/09/2016 02/09/2016 02/08/2016  Glucose 65 - 99 mg/dL 205(H) 289(H) 143(H)  BUN 6 - 20 mg/dL 19 20 20   Creatinine 0.44 - 1.00 mg/dL 0.75 0.80 0.76  Sodium 135 - 145 mmol/L 134(L) 133(L) 135  Potassium 3.5 - 5.1 mmol/L 4.5 3.9 3.8  Chloride 101 - 111 mmol/L 105 106 106  CO2 22 - 32 mmol/L 22 24 22   Calcium 8.9 - 10.3 mg/dL 8.9 8.6(L) 8.8(L)   CBC Latest Ref Rng 02/09/2016 02/08/2016 01/25/2016  WBC 3.6 - 11.0 K/uL 8.0 10.2 7.6  Hemoglobin 12.0 - 16.0 g/dL 11.3(L) 13.3 12.0  Hematocrit 35.0 - 47.0 % 32.2(L) 40.0 34.8(L)  Platelets 150 - 440 K/uL 230 306 204   Component     Latest Ref Rng 06/23/2015 11/02/2015  Hemoglobin A1C     4.0 - 6.0 % 12.5 (H) 7.5 (H)    Blood glucose values reviewed in glucose accordion view  ASSESSMENT:  DKA - resolved, no clear trigger Uncontrolled type 2 Diabetes  AKI - resolved   RECOMMENDATIONS:   Insulin & oral medications: would restart her home metformin 1000 mg twice daily.  Increase levemir dose to 30 units daily Increase novolog dose to 9 units with meals TID Modify insulin correction scale  Glucose monitoring: AC/HS  Please update Hb A1c Pt will be seen by Dr. Gabriel Carina tomorrow  She  will need Endo f/u 2 weeks post-discharge Thank you for this consult  Atha Starks, MD [facility] Endocrinology

## 2016-02-09 NOTE — Telephone Encounter (Signed)
Patient daughter came by office this morning. Allison Cooke and stated she needed paperwork filled out by you for her to be out of work to take care of her mom and to get help once going home. Patient daughter was notified that she was not on the chart as a person we can talk to and there was not anything we could tell her about her mothers help. Patient was informed by myself and office manager Miel to talk to Social worker at hospital or speak to her case worker at the [facility] office to get services in place. Daughter was also notified to come into office with her mother at next appointment to have [place] paperwork signed.

## 2016-02-09 NOTE — Progress Notes (Signed)
Inpatient Diabetes Program Recommendations  AACE/ADA: New Consensus Statement on Inpatient Glycemic Control (2015)  Target Ranges:  Prepandial:   less than 140 mg/dL      Peak postprandial:   less than 180 mg/dL (1-2 hours)      Critically ill patients:  140 - 180 mg/dL   Lab Results  Component Value Date   GLUCAP 197* 02/09/2016   HGBA1C  02/08/2016    UNABLE TO REPORT AIC DUE TO UNKNOWN INTERFERING FACTOR CAUSING THE ANALYTICAL RANGE TO BE OUTSIDE OF ANALYZER RANGE. SPECIMEN SENT TO [facility] FOR AN ALTERNATIVE METHOD.    Review of Glycemic Control  Results for LACHANA, SARSOUR (MRN [id number]) as of 02/09/2016 07:26  Ref. Range 02/08/2016 23:09 02/09/2016 00:01 02/09/2016 01:03 02/09/2016 02:07 02/09/2016 03:09 02/09/2016 04:03 02/09/2016 05:15 02/09/2016 07:07  Glucose-Capillary Latest Ref Range: 65-99 mg/dL 149 (H) 167 (H) 150 (H) 211 (H) 305 (H) 194 (H) 164 (H) 197 (H)    Diabetes history: Type 2 Outpatient Diabetes medications: Novolog 9 units tid with meals, Levemir 30 units qam, Metformin 1000mg  bid Current orders for Inpatient glycemic control: Levemir 25 units qhs, Novolog moderate correction (0-15 units) tid, Novolog 0-9 units qhs  Inpatient Diabetes Program Recommendations:  Spoke with patient- she takes her insulin in her right leg only- uses a syringe and bottle which she keeps in the fridge.  She takes Levemir in the morning. She opened a new bottle recently.   Please consider changing Levemir to qam (vs. qhs as it is ordered)- start now.  Please consider adding Novolog 3 units tid with meals (hold if she eats less than 50%) Continue Novolog correction as ordered.  I have asked the patient to rotate her insulin administration around her abdomen and not to use her arms or legs for administration.  Patient tells me she had an allergic reaction to hair dye earlier this year which required steroids. She was diagnosed with diabetes at that time. MD has been working with her to get blood sugars  under control.   Gentry Fitz, RN, BA, MHA, CDE Diabetes Coordinator Inpatient Diabetes Program  [phone] (Team Pager) [phone] ([place]) 02/09/2016 7:53 AM

## 2016-02-09 NOTE — Progress Notes (Signed)
[place] [facility] [place] NAME: Emonee Ave    MR#:  [id number]  DATE OF BIRTH:  [birth year]  SUBJECTIVE:  CHIEF COMPLAINT:   Chief Complaint  Patient presents with  . Hyperglycemia   -Admitted with DKA. Sensation has been compliant with the medications. Sugars have been uncontrolled even as outpatient. -Off insulin drip. Feels much better   REVIEW OF SYSTEMS:  Review of Systems  Constitutional: Positive for malaise/fatigue. Negative for fever and chills.  HENT: Negative for ear discharge, ear pain and nosebleeds.   Respiratory: Negative for cough, shortness of breath and wheezing.   Cardiovascular: Negative for chest pain, palpitations and leg swelling.  Gastrointestinal: Negative for nausea, vomiting, abdominal pain, diarrhea and constipation.  Genitourinary: Positive for frequency. Negative for dysuria and urgency.  Neurological: Negative for dizziness, sensory change, speech change, focal weakness, seizures and headaches.  Psychiatric/Behavioral: Negative for depression and suicidal ideas.    DRUG ALLERGIES:   Allergies  Allergen Reactions  . Shrimp [Shellfish Allergy] Hives and Itching    VITALS:  Blood pressure 112/71, pulse 87, temperature 97.9 F (36.6 C), temperature source Oral, resp. rate 18, height 5\' 3"  (1.6 m), weight 77.111 kg (170 lb), last menstrual period 04/29/2015, SpO2 100 %.  PHYSICAL EXAMINATION:  Physical Exam  GENERAL:  55 y.o.-year-old patient lying in the bed with no acute distress.  EYES: Pupils equal, round, reactive to light and accommodation. No scleral icterus. Extraocular muscles intact.  HEENT: Head atraumatic, normocephalic. Oropharynx and nasopharynx clear.  NECK:  Supple, no jugular venous distention. No thyroid enlargement, no tenderness.  LUNGS: Normal breath sounds bilaterally, no wheezing, rales,rhonchi or crepitation. No use of accessory muscles of respiration.  CARDIOVASCULAR: S1,  S2 normal. No murmurs, rubs, or gallops.  ABDOMEN: Soft, nontender, nondistended. Bowel sounds present. No organomegaly or mass.  EXTREMITIES: No pedal edema, cyanosis, or clubbing.  NEUROLOGIC: Cranial nerves II through XII are intact. Muscle strength 5/5 in all extremities. Sensation intact. Gait not checked.  PSYCHIATRIC: The patient is alert and oriented x 3.  SKIN: No obvious rash, lesion, or ulcer.    LABORATORY PANEL:   CBC  Recent Labs Lab 02/09/16 0313  WBC 8.0  HGB 11.3*  HCT 32.2*  PLT 230   ------------------------------------------------------------------------------------------------------------------  Chemistries   Recent Labs Lab 02/09/16 0737  NA 134*  K 4.5  CL 105  CO2 22  GLUCOSE 205*  BUN 19  CREATININE 0.75  CALCIUM 8.9   ------------------------------------------------------------------------------------------------------------------  Cardiac Enzymes  Recent Labs Lab 02/08/16 1205  TROPONINI <0.03   ------------------------------------------------------------------------------------------------------------------  RADIOLOGY:  No results found.  EKG:   Orders placed or performed during the hospital encounter of 02/08/16  . ED EKG  . ED EKG    ASSESSMENT AND PLAN:   55 year old female with past medical history hypertension, diabetes, posttraumatic stress disorder, osteoarthritis, history of seizures, who presented to the hospital with diabetic ketoacidosis.  1. Diabetic ketoacidosis - known history of type 2 diabetes mellitus with uncontrolled blood sugars. -Appreciate endocrinology consult. Patient is off of insulin drip. -Replace electrolytes as needed. Check hemoglobin A1c. -Antidepressant Rexulti can cause metabolic syndrome per pharmacy- need to look into to see if psychiatrist can change this as outpatient - for now on lantus 30 units and novolog tid and ssi  2. ARF - likely dehydration due to DKA.  - will hydrate with  with IV fluids and will follow.   3. HTN - hold Lisinopril, cont. Norvasc.  4. Depression - cont. Zoloft. On rexulti- recently started 2 months ago as her depression did not respond well to other anti depressants- however it can worsen metabolic syndrome- if sugars remain uncontrolled as outpatient- will need to change this Discussed this with the patient- to discuss with her behavioural medicine provider at [facility]  5. DVT prophylaxis-on subcutaneous heparin      All the records are reviewed and case discussed with Care Management/Social Workerr. Management plans discussed with the patient, family and they are in agreement.  CODE STATUS: Full code  TOTAL TIME TAKING CARE OF THIS PATIENT: 37 minutes.   POSSIBLE D/C tomorrow, DEPENDING ON CLINICAL CONDITION.   Gladstone Lighter M.D on 02/09/2016 at 3:24 PM  Between 7am to 6pm - Pager - [phone]  After 6pm go to [email or web address] - password [email or web address] [facility]  Office  [phone]  CC: Primary care physician; Enid Derry, MD

## 2016-02-09 NOTE — Progress Notes (Signed)
Prayer provided and completion of [place].

## 2016-02-10 LAB — GLUCOSE, CAPILLARY
GLUCOSE-CAPILLARY: 406 mg/dL — AB (ref 65–99)
GLUCOSE-CAPILLARY: 442 mg/dL — AB (ref 65–99)
GLUCOSE-CAPILLARY: 67 mg/dL (ref 65–99)
GLUCOSE-CAPILLARY: 201 mg/dL — AB (ref 65–99)
Glucose-Capillary: 420 mg/dL — ABNORMAL HIGH (ref 65–99)
Glucose-Capillary: 157 mg/dL — ABNORMAL HIGH (ref 65–99)
Glucose-Capillary: 105 mg/dL — ABNORMAL HIGH (ref 65–99)

## 2016-02-10 LAB — MISC LABCORP TEST (SEND OUT)
Labcorp test code: 1453
Labcorp test code: 1453

## 2016-02-10 LAB — BASIC METABOLIC PANEL
ANION GAP: 5 (ref 5–15)
BUN: 24 mg/dL — ABNORMAL HIGH (ref 6–20)
CO2: 23 mmol/L (ref 22–32)
Calcium: 8.8 mg/dL — ABNORMAL LOW (ref 8.9–10.3)
Chloride: 107 mmol/L (ref 101–111)
Creatinine, Ser: 0.88 mg/dL (ref 0.44–1.00)
GLUCOSE: 415 mg/dL — AB (ref 65–99)
POTASSIUM: 4.5 mmol/L (ref 3.5–5.1)
Sodium: 135 mmol/L (ref 135–145)

## 2016-02-10 MED ORDER — INSULIN ASPART 100 UNIT/ML ~~LOC~~ SOLN
16.0000 [IU] | Freq: Once | SUBCUTANEOUS | Status: AC
Start: 2016-02-10 — End: 2016-02-10
  Administered 2016-02-10: 16 [IU] via SUBCUTANEOUS
  Filled 2016-02-10: qty 16

## 2016-02-10 MED ORDER — INSULIN DETEMIR 100 UNIT/ML ~~LOC~~ SOLN
35.0000 [IU] | Freq: Every day | SUBCUTANEOUS | Status: DC
  Administered 2016-02-10: 35 [IU] via SUBCUTANEOUS
  Filled 2016-02-10: qty 0.35

## 2016-02-10 MED ORDER — ONETOUCH ULTRASOFT LANCETS MISC: Status: AC

## 2016-02-10 MED ORDER — LISINOPRIL 5 MG PO TABS: 5.0000 mg | ORAL_TABLET | Freq: Every day | ORAL | Status: DC

## 2016-02-10 MED ORDER — GLUCOSE BLOOD VI STRP: ORAL_STRIP | Status: AC

## 2016-02-10 MED ORDER — INSULIN DETEMIR 100 UNIT/ML FLEXPEN: 35.0000 [IU] | PEN_INJECTOR | Freq: Every morning | SUBCUTANEOUS | Status: DC

## 2016-02-10 MED ORDER — INSULIN ASPART 100 UNIT/ML FLEXPEN: 9.0000 [IU] | PEN_INJECTOR | Freq: Three times a day (TID) | SUBCUTANEOUS | Status: DC

## 2016-02-10 NOTE — Progress Notes (Signed)
Pt  discharged  home ambulatory . Scripts at [facility] . Pt i/s   To pick up meds  asap and check blood sugar tid

## 2016-02-10 NOTE — Care Management Note (Signed)
Case Management Note  Patient Details  Name: Allison Cooke MRN: [id number] Date of Birth: September 22, 1960  Subjective/Objective:                  Spoke with patient to discuss discharge planning. She has no PT payer for HHPT (Medicaid). She thinks that her her diabetes meds and her depression meds are counteracting each other- she has spoken with RN about it. She lives alone but (two) daughters live close by- supportive per patient. She has 3 daughters total. Patient states is overwhelmed with daily living- getting confused. Walks some days "some days when my ankle bothers me". Has a glucometer- checks 2 times a day. Trying to obtain disability and assistance through [facility] for PCS services. She states that she is current with Dr. Sanda Klein.   Action/Plan: She denies current needs.   Expected Discharge Date:                  Expected Discharge Plan:     In-House Referral:     Discharge planning Services  CM Consult  Post Acute Care Choice:    Choice offered to:  Patient  DME Arranged:    DME Agency:     HH Arranged:    [place] Agency:     Status of Service:  In process, will continue to follow  If discussed at Long Length of Stay Meetings, dates discussed:    Additional Comments:  Marshell Garfinkel, RN 02/10/2016, 8:03 AM

## 2016-02-10 NOTE — Discharge Summary (Signed)
[place] [facility] [place] NAME: Allison Cooke    MR#:  [id number]  DATE OF BIRTH:  1961-06-21  DATE OF ADMISSION:  02/08/2016 ADMITTING PHYSICIAN: Henreitta Leber, MD  DATE OF DISCHARGE: 02/10/2016  PRIMARY CARE PHYSICIAN: Enid Derry, MD    ADMISSION DIAGNOSIS:  Diabetic ketoacidosis without coma associated with type 1 diabetes mellitus ([place]) [E10.10]  DISCHARGE DIAGNOSIS:  Active Problems:   DKA (diabetic ketoacidoses) ([place])   SECONDARY DIAGNOSIS:   Past Medical History  Diagnosis Date  . Hypertension   . SOB (shortness of breath)   . Heart murmur   . Headache   . Arthritis   . Anemia   . Seizures ([place])     as a child  . Diabetes mellitus without complication ([place])   . Chronic post-traumatic stress disorder (PTSD) 01/29/2016  . Lumbar back pain 02/03/2016    HOSPITAL COURSE:   55 year old female with past medical history hypertension, diabetes, posttraumatic stress disorder, osteoarthritis, history of seizures, who presented to the hospital with diabetic ketoacidosis.  1. Diabetic ketoacidosis - known history of type 2 diabetes mellitus with uncontrolled blood sugars. -Appreciate endocrinology consult. hemoglobin A1c was too high to be reported - education given - being discharged on levemir flex pen 35 units QAM and novolog flex pen 9 units TID with meals -Antidepressant Rexulti can cause metabolic syndrome per pharmacy- need to look into to see if psychiatrist can change this as outpatient  2. ARF - likely dehydration due to DKA.  - Improved with hydration   3. HTN - low dose Lisinopril and Norvasc.   4. Depression - cont. Zoloft.  -On rexulti- recently started 2 months ago as her depression did not respond well to other anti depressants- however it can worsen metabolic syndrome- if sugars remain uncontrolled as outpatient- will need to change this Discussed this with the patient- to discuss with her behavioural  medicine provider at [facility]  Stable for discharge today  DISCHARGE CONDITIONS:   Guarded  CONSULTS OBTAINED:  Treatment Team:  Sheran Fava, MD  DRUG ALLERGIES:   Allergies  Allergen Reactions  . Shrimp [Shellfish Allergy] Hives and Itching    DISCHARGE MEDICATIONS:   Current Discharge Medication List    START taking these medications   Details  insulin aspart (NOVOLOG) 100 UNIT/ML FlexPen Inject 9 Units into the skin 3 (three) times daily with meals. Qty: 90 pen, Refills: 11    Lancets (ONETOUCH ULTRASOFT) lancets Use as instructed Qty: 100 each, Refills: 12      CONTINUE these medications which have CHANGED   Details  glucose blood (ACCU-CHEK AVIVA) test strip Check fingerstick blood sugars three times a day; E11.65, LON 99 months; insulin-dependent; Accu-check Aviva Plus strips Qty: 100 each, Refills: 12    Insulin Detemir (LEVEMIR) 100 UNIT/ML Pen Inject 35 Units into the skin every morning. Qty: 30 pen, Refills: 11    lisinopril (PRINIVIL,ZESTRIL) 5 MG tablet Take 1 tablet (5 mg total) by mouth daily. Qty: 30 tablet, Refills: 2      CONTINUE these medications which have NOT CHANGED   Details  albuterol (PROVENTIL HFA;VENTOLIN HFA) 108 (90 Base) MCG/ACT inhaler Inhale 2 puffs into the lungs every 4 (four) hours as needed for wheezing or shortness of breath. Qty: 1 Inhaler, Refills: 2    amLODipine (NORVASC) 5 MG tablet Take 1 tablet (5 mg total) by mouth daily. Qty: 30 tablet, Refills: 5    atorvastatin (LIPITOR) 40  MG tablet Take 1 tablet (40 mg total) by mouth at bedtime. Qty: 30 tablet, Refills: 2    Brexpiprazole (REXULTI) 4 MG TABS Take 4 mg by mouth daily.    metFORMIN (GLUCOPHAGE) 500 MG tablet Take 2 tablets (1,000 mg total) by mouth 2 (two) times daily with a meal. Qty: 120 tablet, Refills: 5    sertraline (ZOLOFT) 100 MG tablet Take 2 tablets (200 mg total) by mouth daily. Qty: 60 tablet, Refills: 0    traMADol (ULTRAM)  50 MG tablet Take 1 tablet (50 mg total) by mouth every 6 (six) hours as needed for moderate pain. Qty: 40 tablet, Refills: 0   Associated Diagnoses: Right anterior knee pain    traZODone (DESYREL) 100 MG tablet Take 100 mg by mouth at bedtime.      STOP taking these medications     insulin aspart (NOVOLOG) 100 UNIT/ML injection      B-D INS SYR ULTRAFINE 1CC/31G 31G X 5/16" 1 ML MISC      Insulin Pen Needle (PEN NEEDLES) 32G X 4 MM MISC          DISCHARGE INSTRUCTIONS:   1. PCP follow-up in 1-2 weeks 2. Endocrinology follow-up in 2 weeks  If you experience worsening of your admission symptoms, develop shortness of breath, life threatening emergency, suicidal or homicidal thoughts you must seek medical attention immediately by calling 911 or calling your MD immediately  if symptoms less severe.  You Must read complete instructions/literature along with all the possible adverse reactions/side effects for all the Medicines you take and that have been prescribed to you. Take any new Medicines after you have completely understood and accept all the possible adverse reactions/side effects.   Please note  You were cared for by a hospitalist during your hospital stay. If you have any questions about your discharge medications or the care you received while you were in the hospital after you are discharged, you can call the unit and asked to speak with the hospitalist on call if the hospitalist that took care of you is not available. Once you are discharged, your primary care physician will handle any further medical issues. Please note that NO REFILLS for any discharge medications will be authorized once you are discharged, as it is imperative that you return to your primary care physician (or establish a relationship with a primary care physician if you do not have one) for your aftercare needs so that they can reassess your need for medications and monitor your lab values.    Today    CHIEF COMPLAINT:   Chief Complaint  Patient presents with  . Hyperglycemia    VITAL SIGNS:  Blood pressure 108/62, pulse 90, temperature 98.2 F (36.8 C), temperature source Oral, resp. rate 16, height 5\' 3"  (1.6 m), weight 77.111 kg (170 lb), last menstrual period 04/29/2015, SpO2 100 %.  I/O:   Intake/Output Summary (Last 24 hours) at 02/10/16 1354 Last data filed at 02/10/16 0900  Gross per 24 hour  Intake   1396 ml  Output      0 ml  Net   1396 ml    PHYSICAL EXAMINATION:   Physical Exam  GENERAL: 55 y.o.-year-old patient lying in the bed with no acute distress.  EYES: Pupils equal, round, reactive to light and accommodation. No scleral icterus. Extraocular muscles intact.  HEENT: Head atraumatic, normocephalic. Oropharynx and nasopharynx clear.  NECK: Supple, no jugular venous distention. No thyroid enlargement, no tenderness.  LUNGS: Normal breath sounds  bilaterally, no wheezing, rales,rhonchi or crepitation. No use of accessory muscles of respiration.  CARDIOVASCULAR: S1, S2 normal. No murmurs, rubs, or gallops.  ABDOMEN: Soft, nontender, nondistended. Bowel sounds present. No organomegaly or mass.  EXTREMITIES: No pedal edema, cyanosis, or clubbing.  NEUROLOGIC: Cranial nerves II through XII are intact. Muscle strength 5/5 in all extremities. Sensation intact. Gait not checked.  PSYCHIATRIC: The patient is alert and oriented x 3.  SKIN: No obvious rash, lesion, or ulcer.   DATA REVIEW:   CBC  Recent Labs Lab 02/09/16 0313  WBC 8.0  HGB 11.3*  HCT 32.2*  PLT 230    Chemistries   Recent Labs Lab 02/10/16 0406  NA 135  K 4.5  CL 107  CO2 23  GLUCOSE 415*  BUN 24*  CREATININE 0.88  CALCIUM 8.8*    Cardiac Enzymes  Recent Labs Lab 02/08/16 1205  [place] <0.03    Microbiology Results  Results for orders placed or performed during the hospital encounter of 02/08/16  MRSA PCR Screening     Status: None   Collection Time:  02/08/16  5:00 PM  Result Value Ref Range Status   MRSA by PCR NEGATIVE NEGATIVE Final    Comment:        The GeneXpert MRSA Assay (FDA approved for NASAL specimens only), is one component of a comprehensive MRSA colonization surveillance program. It is not intended to diagnose MRSA infection nor to guide or monitor treatment for MRSA infections.     RADIOLOGY:  No results found.  EKG:   Orders placed or performed during the hospital encounter of 02/08/16  . ED EKG  . ED EKG      Management plans discussed with the patient, family and they are in agreement.  CODE STATUS:     Code Status Orders        Start     Ordered   02/08/16 1645  Full code   Continuous     02/08/16 1644    Code Status History    Date Active Date Inactive Code Status Order ID Comments User Context   06/24/2015  3:21 PM 06/26/2015  4:45 PM Full Code [id number]  Vaughan Basta, MD ED      TOTAL TIME TAKING CARE OF THIS PATIENT: 37 minutes.    Gladstone Lighter M.D on 02/10/2016 at 1:54 PM  Between 7am to 6pm - Pager - [phone]  After 6pm go to [email or web address] - password [email or web address] [facility]  Office  [phone]  CC: Primary care physician; Enid Derry, MD

## 2016-02-10 NOTE — Progress Notes (Signed)
ENDOCRINOLOGY CONSULTATION  REFERRING PHYSICIAN:  Abel Presto, MD CONSULTING PHYSICIAN:  A. Lavone Orn, MD PRIMARY CARE PHYSICIAN:  Enid Derry, MD  CHIEF COMPLAINT:  Diabetes mellitus  HISTORY OF PRESENT ILLNESS:  Allison Cooke is a 55 y.o. female with PMH HTN, Type 2 DM, PTSD, and osteoarthritis who presented to the ED on 02/08/16 with severe hyperglycemia, generalized weakness, and numbness/tingling in the extremities. She was admitted to the ICU with DKA. Endocrinology consulted for assistance with hyperglycemia management.   Diabetes was diagnosed in 06/2015 when hospitalized for glucose >1000 in setting of use of glucocorticoids. Diabetes is complicated by peripheral neuropathy. Her home regimen includes Levemir 30 units daily, Novolog 9 units tid cc, and metformin 1000 mg BID. She reports adherence with her regimen. She does monitor blood sugars regularly. She is not following a low carb diet. No clear trigger has been identified for her DKA. Hb A1c 7.5% as of 10/2015.    She feels well currently and is tolerating a carb modified diet without issue. Blood sugars have been: 4 PM --> 246 NovoLog 9 units + 3 correction 9 PM  --> 196 *Ate 1/2 sandwich + 1 bag potato chips 4 AM --> 415 7 AM --> 420 NovoLog 16 units 8 AM --> 442 NovoLog 9 units, Levemir 35 units 10 AM --> 157 11 AM --> 67  *treated with juice 12 PM --> 105   PAST MEDICAL HISTORY:  Past Medical History  Diagnosis Date  . Hypertension   . SOB (shortness of breath)   . Heart murmur   . Headache   . Arthritis   . Anemia   . Seizures ([place])     as a child  . Diabetes mellitus without complication ([place])   . Chronic post-traumatic stress disorder (PTSD) 01/29/2016  . Lumbar back pain 02/03/2016     CURRENT MEDICATIONS:  . amLODipine  5 mg Oral Daily  . atorvastatin  40 mg Oral QHS  . Brexpiprazole  4 mg Oral Daily  . heparin  5,000 Units Subcutaneous Q8H  . insulin aspart  0-15 Units Subcutaneous TID WC   . insulin aspart  0-5 Units Subcutaneous QHS  . insulin aspart  9 Units Subcutaneous TID WC  . insulin detemir  35 Units Subcutaneous Daily  . metFORMIN  1,000 mg Oral BID WC  . sertraline  200 mg Oral Daily  . sodium chloride flush  3 mL Intravenous Q12H  . traZODone  100 mg Oral QHS     SOCIAL HISTORY:  Social History  Substance Use Topics  . Smoking status: Current Some Day Smoker -- 0.25 packs/day for 20 years    Types: Cigarettes  . Smokeless tobacco: Never Used  . Alcohol Use: No     FAMILY HISTORY:   Family History  Problem Relation Age of Onset  . Hypertension Mother   . Heart disease Mother   . Hyperlipidemia Mother   . Diabetes Mother   . Heart disease Father   . Hyperlipidemia Father   . Hypertension Father   . Breast cancer Cousin   . Diabetes Sister   . Hypertension Daughter   . Diabetes Maternal Grandmother   . Diabetes Sister   . Cancer Neg Hx   . Stroke Neg Hx   . COPD Neg Hx      ALLERGIES:  Allergies  Allergen Reactions  . Shrimp [Shellfish Allergy] Hives and Itching    REVIEW OF SYSTEMS:  PULMONARY:  No cough or shortness of breath.  ABDOMEN:  No abdominal pain.  No constipation. No N/V. GEN: No fevers/chills.   PHYSICAL EXAMINATION:  BP 108/62 mmHg  Pulse 90  Temp(Src) 98.2 F (36.8 C) (Oral)  Resp 16  Ht 5\' 3"  (1.6 m)  Wt 77.111 kg (170 lb)  BMI 30.12 kg/m2  SpO2 100%  LMP 04/29/2015 (Approximate)  GENERAL:  Well-developed female in NAD. HEENT:  EOMI.  Oropharynx is clear.  NECK:  Supple.  No thyromegaly.  No neck tenderness.  CARDIAC:  Regular rate and rhythm without murmur.  PULMONARY:  Clear to auscultation bilaterally. No wheeze. ABDOMEN:  Diffusely soft, nontender, nondistended.  EXTREMITIES:  No peripheral edema is present.    SKIN:  No rash or dermatopathy. NEUROLOGIC:  No dysarthria.  No tremor. PSYCHIATRIC:  Alert and oriented, calm, cooperative.   LABORATORY DATA:  Results for orders placed or performed  during the hospital encounter of 02/08/16 (from the past 24 hour(s))  Glucose, capillary     Status: Abnormal   Collection Time: 02/09/16  2:52 PM  Result Value Ref Range   Glucose-Capillary 269 (H) 65 - 99 mg/dL   Comment 1 Notify RN   Glucose, capillary     Status: Abnormal   Collection Time: 02/09/16  4:30 PM  Result Value Ref Range   Glucose-Capillary 246 (H) 65 - 99 mg/dL   Comment 1 Notify RN   Glucose, capillary     Status: Abnormal   Collection Time: 02/09/16  9:04 PM  Result Value Ref Range   Glucose-Capillary 196 (H) 65 - 99 mg/dL   Comment 1 Notify RN   Basic metabolic panel     Status: Abnormal   Collection Time: 02/10/16  4:06 AM  Result Value Ref Range   Sodium 135 135 - 145 mmol/L   Potassium 4.5 3.5 - 5.1 mmol/L   Chloride 107 101 - 111 mmol/L   CO2 23 22 - 32 mmol/L   Glucose, Bld 415 (H) 65 - 99 mg/dL   BUN 24 (H) 6 - 20 mg/dL   Creatinine, Ser 0.88 0.44 - 1.00 mg/dL   Calcium 8.8 (L) 8.9 - 10.3 mg/dL   GFR calc non Af Amer >60 >60 mL/min   GFR calc Af Amer >60 >60 mL/min   Anion gap 5 5 - 15  Glucose, capillary     Status: Abnormal   Collection Time: 02/10/16  7:08 AM  Result Value Ref Range   Glucose-Capillary 406 (H) 65 - 99 mg/dL   Comment 1 Notify RN   Glucose, capillary     Status: Abnormal   Collection Time: 02/10/16  7:30 AM  Result Value Ref Range   Glucose-Capillary 420 (H) 65 - 99 mg/dL   Comment 1 Notify RN   Glucose, capillary     Status: Abnormal   Collection Time: 02/10/16  8:25 AM  Result Value Ref Range   Glucose-Capillary 442 (H) 65 - 99 mg/dL   Comment 1 Document in Chart   Glucose, capillary     Status: Abnormal   Collection Time: 02/10/16 10:24 AM  Result Value Ref Range   Glucose-Capillary 157 (H) 65 - 99 mg/dL  Glucose, capillary     Status: None   Collection Time: 02/10/16 11:47 AM  Result Value Ref Range   Glucose-Capillary 67 65 - 99 mg/dL   Comment 1 Notify RN   Glucose, capillary     Status: Abnormal   Collection  Time: 02/10/16 12:00 PM  Result Value Ref Range   Glucose-Capillary 105 (  H) 65 - 99 mg/dL   Comment 1 Notify RN   Glucose, capillary     Status: Abnormal   Collection Time: 02/10/16 12:56 PM  Result Value Ref Range   Glucose-Capillary 201 (H) 65 - 99 mg/dL   Comment 1 Notify RN      ASSESSMENT:  1. Diabetes mellitus, uncontrolled 2.         DKA - resolved  PLAN: 1. Glycemic control remains erratic. Her sensitivity to insulin and glycemic fluctuations in response to carbohydrates is in pattern of type 1 diabetes or LADA. Could pursue this diagnosis as out-patient, as it will not change in-patient management. 2. Continue Levemir 30 units qhs and NovoLog 9 units tid AC + SSI. 3. Advised her to follow a low carb diet. Advised her that a "snack" of bread and potato chips should be considered a meal and covered with 4-9 units NovoLog, depending on size. 4. She may benefit from diabetes nutritional counseling. 5. She was advised to quit smoking. 6. She should check sugars qACHS.  On discharge she will need to be prescribed: LEVEMIR FLEXPEN -  30 units qhs NOVOLOG FLEXPEN - 9-18 units tid AC as directed, with instructions given to follow current SSI. ACCU-CHEK AVIVA TEST STRIPS FOR USE QACHS LANCETS FOR USE QACHS  Will sign off and she is anticipating discharge today. She should follow up with Dr. Graceann Congress as out-patient in 2 weeks.

## 2016-02-11 LAB — HEMOGLOBIN A1C

## 2016-02-13 ENCOUNTER — Telehealth: Payer: Self-pay | Admitting: Family Medicine

## 2016-02-13 LAB — MISC LABCORP TEST (SEND OUT): LABCORP TEST CODE: 1453

## 2016-02-13 NOTE — Telephone Encounter (Signed)
I prescribed a six month supply at the end of May; please resolve with pharmacy

## 2016-02-13 NOTE — Telephone Encounter (Signed)
Pt is out of her Amlodipine and would like it sent to [facility].

## 2016-02-17 ENCOUNTER — Ambulatory Visit
Admission: RE | Admit: 2016-02-17 | Discharge: 2016-02-17 | Disposition: A | Discharge status: Home / Self Care | Payer: Medicaid Other | Source: Ambulatory Visit | Attending: Family Medicine | Admitting: Family Medicine

## 2016-02-17 DIAGNOSIS — R441 Visual hallucinations: Secondary | ICD-10-CM | POA: Diagnosis not present

## 2016-02-17 DIAGNOSIS — R440 Auditory hallucinations: Secondary | ICD-10-CM | POA: Insufficient documentation

## 2016-02-17 DIAGNOSIS — R269 Unspecified abnormalities of gait and mobility: Secondary | ICD-10-CM | POA: Diagnosis not present

## 2016-02-17 DIAGNOSIS — R519 Headache, unspecified: Secondary | ICD-10-CM

## 2016-02-17 DIAGNOSIS — R51 Headache: Secondary | ICD-10-CM | POA: Insufficient documentation

## 2016-02-17 NOTE — Telephone Encounter (Signed)
Patient notified of results.

## 2016-02-24 ENCOUNTER — Ambulatory Visit (INDEPENDENT_AMBULATORY_CARE_PROVIDER_SITE_OTHER): Payer: Medicaid Other | Admitting: Family Medicine

## 2016-02-24 ENCOUNTER — Encounter: Payer: Self-pay | Admitting: Family Medicine

## 2016-02-24 VITALS — BP 108/64 | HR 96 | Temp 98.6°F | Resp 16 | Wt 181.0 lb

## 2016-02-24 DIAGNOSIS — E1365 Other specified diabetes mellitus with hyperglycemia: Secondary | ICD-10-CM | POA: Diagnosis not present

## 2016-02-24 DIAGNOSIS — E1310 Other specified diabetes mellitus with ketoacidosis without coma: Secondary | ICD-10-CM | POA: Diagnosis not present

## 2016-02-24 DIAGNOSIS — M25561 Pain in right knee: Secondary | ICD-10-CM

## 2016-02-24 DIAGNOSIS — E1110 Type 2 diabetes mellitus with ketoacidosis without coma: Secondary | ICD-10-CM

## 2016-02-24 DIAGNOSIS — F331 Major depressive disorder, recurrent, moderate: Secondary | ICD-10-CM

## 2016-02-24 DIAGNOSIS — M545 Low back pain, unspecified: Secondary | ICD-10-CM

## 2016-02-24 DIAGNOSIS — F4312 Post-traumatic stress disorder, chronic: Secondary | ICD-10-CM | POA: Diagnosis not present

## 2016-02-24 DIAGNOSIS — IMO0002 Reserved for concepts with insufficient information to code with codable children: Secondary | ICD-10-CM

## 2016-02-24 NOTE — Assessment & Plan Note (Signed)
Refer to endocrinologist, behaving more like type 1.5 or Jillyn Stacey at this time

## 2016-02-24 NOTE — Progress Notes (Signed)
BP 108/64   Pulse 96   Temp 98.6 F (37 C) (Oral)   Resp 16   Wt 181 lb (82.1 kg)   LMP 04/29/2015 (Approximate)   SpO2 98%   BMI 32.06 kg/m     Subjective:    Patient ID: Allison Cooke, female    DOB: November 26, 1960, 55 y.o.   MRN: [id number]  HPI: Allison Cooke is a 55 y.o. female  Chief Complaint  Patient presents with  . Follow-up   She is here for hospital follow-up; admitted February 08, 2016 with glucose greater than 600 and was found to be in acute diabetic ketoacidosis; she was monitored in the ICU Her last A1c was too high to report in the hospital; send-out A1c was greater than 15.5; previous A1c in March was 7.5 Feeling some better now She saw endocrinologist in the hospital, but has not seen one as an outpatient yet She would like some help with someone to come into the home to help with chores and housework Arm and hand have been numb; they did not work that up in the hospital; numb for days; has headaches, had CT scan which was negative  Depression screen [facility] 2/9 02/03/2016 01/04/2016 08/03/2015  Decreased Interest 1 1 1   Down, Depressed, Hopeless 3 3 1   PHQ - 2 Score 4 4 2   Altered sleeping 3 0 3  Tired, decreased energy 3 1 1   Change in appetite 1 3 1   Feeling bad or failure about yourself  3 3 1   Trouble concentrating 3 3 1   Moving slowly or fidgety/restless 3 3 1   Suicidal thoughts 2 1 0  PHQ-9 Score 22 18 10   Difficult doing work/chores Extremely dIfficult Very difficult Very difficult   Relevant past medical, surgical, family and social history reviewed Past Medical History:  Diagnosis Date  . Anemia   . Arthritis   . Chronic post-traumatic stress disorder (PTSD) 01/29/2016  . Diabetes mellitus without complication ([place])   . Diabetes, type 1.5, uncontrolled, managed as type 1 ([place]) 06/28/2015  . Headache   . Heart murmur   . Hypertension   . Lumbar back pain 02/03/2016  . Seizures ([place])    as a child  . SOB (shortness of breath)    Past  Surgical History:  Procedure Laterality Date  . BREAST LUMPECTOMY Left 07/14/2015   Procedure: BREAST LUMPECTOMY;  Surgeon: Christene Lye, MD;  Location: [facility] ORS;  Service: General;  Laterality: Left;  . BREAST SURGERY Left   . CESAREAN SECTION    . CHOLECYSTECTOMY    . COLONOSCOPY WITH PROPOFOL N/A 03/06/2016   Procedure: COLONOSCOPY WITH PROPOFOL;  Surgeon: [name], MD;  Location: [facility] ENDOSCOPY;  Service: Endoscopy;  Laterality: N/A;  . DIRECT LARYNGOSCOPY N/A 04/26/2015   Procedure: Microlaryngoscopy with exxcision of vocal cord polyp ;  Surgeon: Beverly Gust, MD;  Location: [facility] ORS;  Service: ENT;  Laterality: N/A;  . FINE NEEDLE ASPIRATION Left    breast cyst, benign, age 79?   Family History  Problem Relation Age of Onset  . Hypertension Mother   . Heart disease Mother   . Hyperlipidemia Mother   . Diabetes Mother   . Heart disease Father   . Hyperlipidemia Father   . Hypertension Father   . Diabetes Sister   . Hypertension Daughter   . Diabetes Maternal Grandmother   . Diabetes Sister   . Breast cancer Cousin   . Cancer Neg Hx   . Stroke Neg  Hx   . COPD Neg Hx    Social History  Substance Use Topics  . Smoking status: Current Some Day Smoker    Packs/day: 0.25    Years: 20.00    Types: Cigarettes  . Smokeless tobacco: Never Used  . Alcohol use No   Interim medical history since last visit reviewed. Allergies and medications reviewed  Review of Systems Per HPI unless specifically indicated above     Objective:    BP 108/64   Pulse 96   Temp 98.6 F (37 C) (Oral)   Resp 16   Wt 181 lb (82.1 kg)   LMP 04/29/2015 (Approximate)   SpO2 98%   BMI 32.06 kg/m    Wt Readings from Last 3 Encounters:  02/24/16 181 lb (82.1 kg)  02/08/16 170 lb (77.1 kg)  02/03/16 174 lb (78.9 kg)    Physical Exam  Constitutional: She appears well-developed and well-nourished. No distress.  HENT:  Head: Normocephalic and atraumatic.  Eyes: EOM are normal.  No scleral icterus.  Neck: No thyromegaly present.  Cardiovascular: Normal rate, regular rhythm and normal heart sounds.   No murmur heard. Pulmonary/Chest: Effort normal and breath sounds normal. No respiratory distress. She has no wheezes.  Abdominal: Soft. Bowel sounds are normal. She exhibits no distension.  Musculoskeletal: Normal range of motion. She exhibits no edema.  Neurological: She is alert. She exhibits normal muscle tone.  Skin: Skin is warm and dry. She is not diaphoretic. No pallor.  Psychiatric: Judgment and thought content normal. Her mood appears not anxious. Her affect is blunt. Her speech is not delayed. She is slowed. She is not withdrawn. Cognition and memory are not impaired. She does not express impulsivity or inappropriate judgment. She exhibits a depressed mood. She expresses no homicidal and no suicidal ideation. She is attentive.   Results for orders placed or performed during the hospital encounter of 02/08/16  MRSA PCR Screening  Result Value Ref Range   MRSA by PCR NEGATIVE NEGATIVE  Glucose, capillary  Result Value Ref Range   Glucose-Capillary >600 (HH) 65 - 99 mg/dL  Basic metabolic panel  Result Value Ref Range   Sodium 122 (L) 135 - 145 mmol/L   Potassium 5.4 (H) 3.5 - 5.1 mmol/L   Chloride 87 (L) 101 - 111 mmol/L   CO2 15 (L) 22 - 32 mmol/L   Glucose, Bld 674 (HH) 65 - 99 mg/dL   BUN 36 (H) 6 - 20 mg/dL   Creatinine, Ser 1.43 (H) 0.44 - 1.00 mg/dL   Calcium 10.0 8.9 - 10.3 mg/dL   GFR calc non Af Amer 41 (L) >60 mL/min   GFR calc Af Amer 47 (L) >60 mL/min   Anion gap 20 (H) 5 - 15  CBC  Result Value Ref Range   WBC 10.2 3.6 - 11.0 K/uL   RBC 4.51 3.80 - 5.20 MIL/uL   Hemoglobin 13.3 12.0 - 16.0 g/dL   HCT 40.0 35.0 - 47.0 %   MCV 88.7 80.0 - 100.0 fL   MCH 29.5 26.0 - 34.0 pg   MCHC 33.2 32.0 - 36.0 g/dL   RDW 14.0 11.5 - 14.5 %   Platelets 306 150 - 440 K/uL  Urinalysis complete, with microscopic  Result Value Ref Range   Color, Urine  STRAW (A) YELLOW   APPearance CLEAR (A) CLEAR   Glucose, UA >500 (A) NEGATIVE mg/dL   Bilirubin Urine NEGATIVE NEGATIVE   Ketones, ur 1+ (A) NEGATIVE mg/dL   Specific  Gravity, Urine 1.020 1.005 - 1.030   Hgb urine dipstick 1+ (A) NEGATIVE   pH 5.0 5.0 - 8.0   Protein, ur NEGATIVE NEGATIVE mg/dL   Nitrite NEGATIVE NEGATIVE   Leukocytes, UA NEGATIVE NEGATIVE   RBC / HPF 0-5 0 - 5 RBC/hpf   WBC, UA 0-5 0 - 5 WBC/hpf   Bacteria, UA RARE (A) NONE SEEN   Squamous Epithelial / LPF 0-5 (A) NONE SEEN  Lipase, blood  Result Value Ref Range   Lipase 44 11 - 51 U/L  Troponin I  Result Value Ref Range   Troponin I <0.03 <0.03 ng/mL  Glucose, capillary  Result Value Ref Range   Glucose-Capillary 375 (H) 65 - 99 mg/dL  Basic metabolic panel  Result Value Ref Range   Sodium 133 (L) 135 - 145 mmol/L   Potassium 4.1 3.5 - 5.1 mmol/L   Chloride 106 101 - 111 mmol/L   CO2 19 (L) 22 - 32 mmol/L   Glucose, Bld 224 (H) 65 - 99 mg/dL   BUN 26 (H) 6 - 20 mg/dL   Creatinine, Ser 0.86 0.44 - 1.00 mg/dL   Calcium 8.2 (L) 8.9 - 10.3 mg/dL   GFR calc non Af Amer >60 >60 mL/min   GFR calc Af Amer >60 >60 mL/min   Anion gap 8 5 - 15  Basic metabolic panel  Result Value Ref Range   Sodium 133 (L) 135 - 145 mmol/L   Potassium 4.0 3.5 - 5.1 mmol/L   Chloride 105 101 - 111 mmol/L   CO2 21 (L) 22 - 32 mmol/L   Glucose, Bld 188 (H) 65 - 99 mg/dL   BUN 25 (H) 6 - 20 mg/dL   Creatinine, Ser 0.72 0.44 - 1.00 mg/dL   Calcium 8.6 (L) 8.9 - 10.3 mg/dL   GFR calc non Af Amer >60 >60 mL/min   GFR calc Af Amer >60 >60 mL/min   Anion gap 7 5 - 15  Basic metabolic panel  Result Value Ref Range   Sodium 135 135 - 145 mmol/L   Potassium 3.8 3.5 - 5.1 mmol/L   Chloride 106 101 - 111 mmol/L   CO2 22 22 - 32 mmol/L   Glucose, Bld 143 (H) 65 - 99 mg/dL   BUN 20 6 - 20 mg/dL   Creatinine, Ser 0.76 0.44 - 1.00 mg/dL   Calcium 8.8 (L) 8.9 - 10.3 mg/dL   GFR calc non Af Amer >60 >60 mL/min   GFR calc Af Amer >60  >60 mL/min   Anion gap 7 5 - 15  Glucose, capillary  Result Value Ref Range   Glucose-Capillary 242 (H) 65 - 99 mg/dL  Hemoglobin A1c  Result Value Ref Range   Hgb A1c MFr Bld  4.0 - 6.0 %    UNABLE TO REPORT AIC DUE TO UNKNOWN INTERFERING FACTOR CAUSING THE ANALYTICAL RANGE TO BE OUTSIDE OF ANALYZER RANGE. SPECIMEN SENT TO [facility] FOR AN ALTERNATIVE METHOD.  Glucose, capillary  Result Value Ref Range   Glucose-Capillary 197 (H) 65 - 99 mg/dL  Glucose, capillary  Result Value Ref Range   Glucose-Capillary 171 (H) 65 - 99 mg/dL  Glucose, capillary  Result Value Ref Range   Glucose-Capillary 190 (H) 65 - 99 mg/dL  Glucose, capillary  Result Value Ref Range   Glucose-Capillary 174 (H) 65 - 99 mg/dL  Glucose, capillary  Result Value Ref Range   Glucose-Capillary 129 (H) 65 - 99 mg/dL  Glucose, capillary  Result Value  Ref Range   Glucose-Capillary 149 (H) 65 - 99 mg/dL  Glucose, capillary  Result Value Ref Range   Glucose-Capillary 234 (H) 65 - 99 mg/dL  Glucose, capillary  Result Value Ref Range   Glucose-Capillary 167 (H) 65 - 99 mg/dL  Glucose, capillary  Result Value Ref Range   Glucose-Capillary 150 (H) 65 - 99 mg/dL  Basic metabolic panel  Result Value Ref Range   Sodium 133 (L) 135 - 145 mmol/L   Potassium 3.9 3.5 - 5.1 mmol/L   Chloride 106 101 - 111 mmol/L   CO2 24 22 - 32 mmol/L   Glucose, Bld 289 (H) 65 - 99 mg/dL   BUN 20 6 - 20 mg/dL   Creatinine, Ser 0.80 0.44 - 1.00 mg/dL   Calcium 8.6 (L) 8.9 - 10.3 mg/dL   GFR calc non Af Amer >60 >60 mL/min   GFR calc Af Amer >60 >60 mL/min   Anion gap 3 (L) 5 - 15  CBC  Result Value Ref Range   WBC 8.0 3.6 - 11.0 K/uL   RBC 3.77 (L) 3.80 - 5.20 MIL/uL   Hemoglobin 11.3 (L) 12.0 - 16.0 g/dL   HCT 32.2 (L) 35.0 - 47.0 %   MCV 85.4 80.0 - 100.0 fL   MCH 30.0 26.0 - 34.0 pg   MCHC 35.1 32.0 - 36.0 g/dL   RDW 14.1 11.5 - 14.5 %   Platelets 230 150 - 440 K/uL  Basic metabolic panel  Result Value Ref Range   Sodium  134 (L) 135 - 145 mmol/L   Potassium 4.5 3.5 - 5.1 mmol/L   Chloride 105 101 - 111 mmol/L   CO2 22 22 - 32 mmol/L   Glucose, Bld 205 (H) 65 - 99 mg/dL   BUN 19 6 - 20 mg/dL   Creatinine, Ser 0.75 0.44 - 1.00 mg/dL   Calcium 8.9 8.9 - 10.3 mg/dL   GFR calc non Af Amer >60 >60 mL/min   GFR calc Af Amer >60 >60 mL/min   Anion gap 7 5 - 15  Glucose, capillary  Result Value Ref Range   Glucose-Capillary 211 (H) 65 - 99 mg/dL  Glucose, capillary  Result Value Ref Range   Glucose-Capillary 305 (H) 65 - 99 mg/dL  Glucose, capillary  Result Value Ref Range   Glucose-Capillary 194 (H) 65 - 99 mg/dL  Glucose, capillary  Result Value Ref Range   Glucose-Capillary 164 (H) 65 - 99 mg/dL  Miscellaneous [facility] test (send-out)  Result Value Ref Range   [facility] test code 1,453    [facility] test name HEMOGLOBIN (HB) A1C    Misc [facility] result COMMENT   Glucose, capillary  Result Value Ref Range   Glucose-Capillary 197 (H) 65 - 99 mg/dL  Glucose, capillary  Result Value Ref Range   Glucose-Capillary 331 (H) 65 - 99 mg/dL  Hemoglobin A1c  Result Value Ref Range   Hgb A1c MFr Bld  4.0 - 6.0 %     UNABLE TO REPORT AIC DUE TO UNKNOWN INTERFERING FACTOR CAUSING THE ANALYTICAL RANGE TO BE OUTSIDE OF ANALYZER RANGE SAMPLE SENT TO [facility] FOR AN ALTERNATIVE METHOD  Glucose, capillary  Result Value Ref Range   Glucose-Capillary 269 (H) 65 - 99 mg/dL   Comment 1 Notify RN   Miscellaneous [facility] test (send-out)  Result Value Ref Range   [facility] test code 1,453    [facility] test name  HEMOGLOBIN (HB) A1C    Misc [facility] result COMMENT   Glucose,  capillary  Result Value Ref Range   Glucose-Capillary 246 (H) 65 - 99 mg/dL   Comment 1 Notify RN   Basic metabolic panel  Result Value Ref Range   Sodium 135 135 - 145 mmol/L   Potassium 4.5 3.5 - 5.1 mmol/L   Chloride 107 101 - 111 mmol/L   CO2 23 22 - 32 mmol/L   Glucose, Bld 415 (H) 65 - 99 mg/dL   BUN 24 (H) 6 - 20 mg/dL   Creatinine, Ser 0.88  0.44 - 1.00 mg/dL   Calcium 8.8 (L) 8.9 - 10.3 mg/dL   GFR calc non Af Amer >60 >60 mL/min   GFR calc Af Amer >60 >60 mL/min   Anion gap 5 5 - 15  Glucose, capillary  Result Value Ref Range   Glucose-Capillary 196 (H) 65 - 99 mg/dL   Comment 1 Notify RN   Glucose, capillary  Result Value Ref Range   Glucose-Capillary 406 (H) 65 - 99 mg/dL   Comment 1 Notify RN   Hemoglobin A1c  Result Value Ref Range   Hgb A1c MFr Bld  4.0 - 6.0 %    UNABLE TO REPORT A1C DUE TO UNKNOWN INTERFERING FACTOR CAUSING THE ANALYTICAL.RANGE TO BE OUTSIDE OF ANALYZER RANGE.SAMPLE SENT TO [facility] FOR AN ALTERNATIVE METHOD.  Glucose, capillary  Result Value Ref Range   Glucose-Capillary 420 (H) 65 - 99 mg/dL   Comment 1 Notify RN   Glucose, capillary  Result Value Ref Range   Glucose-Capillary 442 (H) 65 - 99 mg/dL   Comment 1 Document in Chart   Glucose, capillary  Result Value Ref Range   Glucose-Capillary 157 (H) 65 - 99 mg/dL  Glucose, capillary  Result Value Ref Range   Glucose-Capillary 67 65 - 99 mg/dL   Comment 1 Notify RN   Glucose, capillary  Result Value Ref Range   Glucose-Capillary 105 (H) 65 - 99 mg/dL   Comment 1 Notify RN   Glucose, capillary  Result Value Ref Range   Glucose-Capillary 201 (H) 65 - 99 mg/dL   Comment 1 Notify RN   Miscellaneous [facility] test (send-out)  Result Value Ref Range   [facility] test code 1,453    [facility] test name HEMOGLOBIN(HB)A1C    Misc [facility] result COMMENT       Assessment & Plan:   Problem List Items Addressed This Visit      Endocrine   DKA (diabetic ketoacidoses) ([place]) - Primary    Refer to endocrinologist, behaving more like type 1.5 or Murvin Gift at this time      Relevant Orders   Ambulatory referral to Endocrinology   Diabetes, type 1.5, uncontrolled, managed as type 1 ([place])    Reviewed her reports and lab results from the hospital; her diabetes had been under relatively good control, then suddenly worsened; on insulin; refer to endo to  see if type 1.5; foot exam by MD; patient encouraged in self-care        Other   Right anterior knee pain    Evaluated by ortho; okay for help in the home, personal care services to assist patient      Lumbar back pain    okay for help in the home, personal care services to assist patient      Depression, major, recurrent ([place])    As noted in PTSD; continue to work with psychiastrist and therapist      Chronic post-traumatic stress disorder (PTSD)    Supportive listening today; if thoughts of  self-harm or hurting others, call 911 or crisis line or go to ER; she agrees; continue to work with psychiatrist, therapist       Other Visit Diagnoses   None.      Follow up plan: No Follow-up on file.  An after-visit summary was printed and given to the patient at [facility].  Please see the patient instructions which may contain other information and recommendations beyond what is mentioned above in the assessment and plan.  No orders of the defined types were placed in this encounter.   Orders Placed This Encounter  Procedures  . Ambulatory referral to Endocrinology

## 2016-03-06 ENCOUNTER — Ambulatory Visit: Payer: Medicaid Other | Admitting: Anesthesiology

## 2016-03-06 ENCOUNTER — Ambulatory Visit
Admission: RE | Admit: 2016-03-06 | Discharge: 2016-03-06 | Disposition: A | Discharge status: Home / Self Care | Payer: Medicaid Other | Source: Ambulatory Visit | Attending: Gastroenterology | Admitting: Gastroenterology

## 2016-03-06 ENCOUNTER — Encounter
Admission: RE | Disposition: A | Discharge status: Home / Self Care | Payer: Self-pay | Source: Ambulatory Visit | Attending: Gastroenterology

## 2016-03-06 DIAGNOSIS — Z862 Personal history of diseases of the blood and blood-forming organs and certain disorders involving the immune mechanism: Secondary | ICD-10-CM | POA: Diagnosis not present

## 2016-03-06 DIAGNOSIS — E119 Type 2 diabetes mellitus without complications: Secondary | ICD-10-CM | POA: Diagnosis not present

## 2016-03-06 DIAGNOSIS — Z1211 Encounter for screening for malignant neoplasm of colon: Secondary | ICD-10-CM | POA: Diagnosis not present

## 2016-03-06 DIAGNOSIS — K621 Rectal polyp: Secondary | ICD-10-CM | POA: Diagnosis not present

## 2016-03-06 DIAGNOSIS — F17210 Nicotine dependence, cigarettes, uncomplicated: Secondary | ICD-10-CM | POA: Insufficient documentation

## 2016-03-06 DIAGNOSIS — I10 Essential (primary) hypertension: Secondary | ICD-10-CM | POA: Diagnosis not present

## 2016-03-06 DIAGNOSIS — K641 Second degree hemorrhoids: Secondary | ICD-10-CM | POA: Diagnosis not present

## 2016-03-06 DIAGNOSIS — M1990 Unspecified osteoarthritis, unspecified site: Secondary | ICD-10-CM | POA: Diagnosis not present

## 2016-03-06 DIAGNOSIS — Z91013 Allergy to seafood: Secondary | ICD-10-CM | POA: Insufficient documentation

## 2016-03-06 DIAGNOSIS — F4312 Post-traumatic stress disorder, chronic: Secondary | ICD-10-CM | POA: Diagnosis not present

## 2016-03-06 DIAGNOSIS — Z794 Long term (current) use of insulin: Secondary | ICD-10-CM | POA: Diagnosis not present

## 2016-03-06 DIAGNOSIS — D124 Benign neoplasm of descending colon: Secondary | ICD-10-CM | POA: Diagnosis not present

## 2016-03-06 DIAGNOSIS — D125 Benign neoplasm of sigmoid colon: Secondary | ICD-10-CM | POA: Diagnosis not present

## 2016-03-06 DIAGNOSIS — Z79899 Other long term (current) drug therapy: Secondary | ICD-10-CM | POA: Diagnosis not present

## 2016-03-06 HISTORY — PX: COLONOSCOPY WITH PROPOFOL: SHX5780

## 2016-03-06 LAB — GLUCOSE, CAPILLARY: Glucose-Capillary: 148 mg/dL — ABNORMAL HIGH (ref 65–99)

## 2016-03-06 SURGERY — COLONOSCOPY WITH PROPOFOL
Anesthesia: General

## 2016-03-06 MED ORDER — PROPOFOL 500 MG/50ML IV EMUL
INTRAVENOUS | Status: DC | PRN
  Administered 2016-03-06: 125 ug/kg/min via INTRAVENOUS

## 2016-03-06 MED ORDER — SODIUM CHLORIDE 0.9 % IV SOLN
INTRAVENOUS | Status: DC
  Administered 2016-03-06: 1000 mL via INTRAVENOUS

## 2016-03-06 MED ORDER — EPHEDRINE SULFATE 50 MG/ML IJ SOLN
INTRAMUSCULAR | Status: DC | PRN
  Administered 2016-03-06 (×2): 10 mg via INTRAVENOUS

## 2016-03-06 MED ORDER — LIDOCAINE HCL (CARDIAC) 20 MG/ML IV SOLN
INTRAVENOUS | Status: DC | PRN
  Administered 2016-03-06: 30 mg via INTRAVENOUS

## 2016-03-06 MED ORDER — PROPOFOL 10 MG/ML IV BOLUS
INTRAVENOUS | Status: DC | PRN
  Administered 2016-03-06: 80 mg via INTRAVENOUS
  Administered 2016-03-06 (×2): 30 mg via INTRAVENOUS

## 2016-03-06 NOTE — Anesthesia Postprocedure Evaluation (Signed)
Anesthesia Post Note  Patient: TARISHA HERBST  Procedure(s) Performed: Procedure(s) (LRB): COLONOSCOPY WITH PROPOFOL (N/A)  Patient location during evaluation: Endoscopy Anesthesia Type: General Level of consciousness: awake and alert Pain management: pain level controlled Vital Signs Assessment: post-procedure vital signs reviewed and stable Respiratory status: spontaneous breathing, nonlabored ventilation, respiratory function stable and patient connected to nasal cannula oxygen Cardiovascular status: blood pressure returned to baseline and stable Postop Assessment: no signs of nausea or vomiting Anesthetic complications: no    Last Vitals:  Vitals:   03/06/16 0953 03/06/16 1000  BP:    Pulse:    Resp:    Temp: (!) 35.7 C (!) 35.7 C    Last Pain:  Vitals:   03/06/16 0856  TempSrc: Tympanic                 Martha Clan

## 2016-03-06 NOTE — H&P (Signed)
Allison Lame, MD [facility] [address], [place], [place] [address] Phone: [phone] Fax : [phone]  Primary Care Physician:  Enid Derry, MD Primary Gastroenterologist:  Dr. Allen Norris  Pre-Procedure History & Physical: HPI:  Allison Cooke is a 55 y.o. female is here for a screening colonoscopy.   Past Medical History:  Diagnosis Date  . Anemia   . Arthritis   . Chronic post-traumatic stress disorder (PTSD) 01/29/2016  . Diabetes mellitus without complication ([place])   . Diabetes, type 1.5, uncontrolled, managed as type 1 ([place]) 06/28/2015  . Headache   . Heart murmur   . Hypertension   . Lumbar back pain 02/03/2016  . Seizures ([place])    as a child  . SOB (shortness of breath)     Past Surgical History:  Procedure Laterality Date  . BREAST LUMPECTOMY Left 07/14/2015   Procedure: BREAST LUMPECTOMY;  Surgeon: Christene Lye, MD;  Location: [facility] ORS;  Service: General;  Laterality: Left;  . BREAST SURGERY Left   . CESAREAN SECTION    . CHOLECYSTECTOMY    . DIRECT LARYNGOSCOPY N/A 04/26/2015   Procedure: Microlaryngoscopy with exxcision of vocal cord polyp ;  Surgeon: Beverly Gust, MD;  Location: [facility] ORS;  Service: ENT;  Laterality: N/A;  . FINE NEEDLE ASPIRATION Left    breast cyst, benign, age 89?    Prior to Admission medications   Medication Sig Start Date End Date Taking? Authorizing Provider  amLODipine (NORVASC) 5 MG tablet Take 1 tablet (5 mg total) by mouth daily. 01/04/16  Yes Arnetha Courser, MD  insulin aspart (NOVOLOG) 100 UNIT/ML FlexPen Inject 9 Units into the skin 3 (three) times daily with meals. 02/10/16  Yes Gladstone Lighter, MD  Insulin Detemir (LEVEMIR) 100 UNIT/ML Pen Inject 35 Units into the skin every morning. 02/10/16  Yes Gladstone Lighter, MD  lisinopril (PRINIVIL,ZESTRIL) 5 MG tablet Take 1 tablet (5 mg total) by mouth daily. 02/10/16  Yes Gladstone Lighter, MD  albuterol (PROVENTIL HFA;VENTOLIN HFA) 108 (90 Base) MCG/ACT inhaler Inhale 2  puffs into the lungs every 4 (four) hours as needed for wheezing or shortness of breath. 01/04/16   Arnetha Courser, MD  atorvastatin (LIPITOR) 40 MG tablet Take 1 tablet (40 mg total) by mouth at bedtime. 01/04/16   Arnetha Courser, MD  Brexpiprazole (REXULTI) 4 MG TABS Take 4 mg by mouth daily.    Historical Provider, MD  glucose blood (ACCU-CHEK AVIVA) test strip Check fingerstick blood sugars three times a day; E11.65, LON 99 months; insulin-dependent; Accu-check Aviva Plus strips 02/10/16   Gladstone Lighter, MD  Lancets [facility] ULTRASOFT) lancets Use as instructed 02/10/16   Gladstone Lighter, MD  metFORMIN (GLUCOPHAGE) 500 MG tablet Take 2 tablets (1,000 mg total) by mouth 2 (two) times daily with a meal. 01/04/16   Arnetha Courser, MD  sertraline (ZOLOFT) 100 MG tablet Take 2 tablets (200 mg total) by mouth daily. 11/02/15   Arnetha Courser, MD  traMADol (ULTRAM) 50 MG tablet Take 1 tablet (50 mg total) by mouth every 6 (six) hours as needed for moderate pain. 02/03/16   Arnetha Courser, MD  traZODone (DESYREL) 100 MG tablet Take 100 mg by mouth at bedtime.    Historical Provider, MD    Allergies as of 01/18/2016 - Review Complete 01/04/2016  Allergen Reaction Noted  . Shrimp [shellfish allergy] Hives and Itching 06/19/2015    Family History  Problem Relation Age of Onset  . Hypertension Mother   .  Heart disease Mother   . Hyperlipidemia Mother   . Diabetes Mother   . Heart disease Father   . Hyperlipidemia Father   . Hypertension Father   . Diabetes Sister   . Hypertension Daughter   . Diabetes Maternal Grandmother   . Diabetes Sister   . Breast cancer Cousin   . Cancer Neg Hx   . Stroke Neg Hx   . COPD Neg Hx     Social History   Social History  . Marital status: Widowed    Spouse name: N/A  . Number of children: N/A  . Years of education: N/A   Occupational History  . Not on file.   Social History Main Topics  . Smoking status: Current Some Day Smoker    Packs/day: 0.25     Years: 20.00    Types: Cigarettes  . Smokeless tobacco: Never Used  . Alcohol use No  . Drug use: No  . Sexual activity: Yes    Birth control/ protection: None   Other Topics Concern  . Not on file   Social History Narrative  . No narrative on file    Review of Systems: See HPI, otherwise negative ROS  Physical Exam: BP 124/85   Pulse 73   Temp 97.9 F (36.6 C) (Tympanic)   Resp 16   Ht 5\' 3"  (1.6 m)   LMP 04/29/2015 (Approximate)   SpO2 100%  General:   Alert,  pleasant and cooperative in NAD Head:  Normocephalic and atraumatic. Neck:  Supple; no masses or thyromegaly. Lungs:  Clear throughout to auscultation.    Heart:  Regular rate and rhythm. Abdomen:  Soft, nontender and nondistended. Normal bowel sounds, without guarding, and without rebound.   Neurologic:  Alert and  oriented x4;  grossly normal neurologically.  Impression/Plan: Allison Cooke is now here to undergo a screening colonoscopy.  Risks, benefits, and alternatives regarding colonoscopy have been reviewed with the patient.  Questions have been answered.  All parties agreeable.

## 2016-03-06 NOTE — Anesthesia Preprocedure Evaluation (Signed)
Anesthesia Evaluation  Patient identified by MRN, date of birth, ID band Patient awake    Reviewed: Allergy & Precautions, H&P , NPO status , Patient's Chart, lab work & pertinent test results, reviewed documented beta blocker date and time   History of Anesthesia Complications Negative for: history of anesthetic complications  Airway Mallampati: I  TM Distance: >3 FB Neck ROM: full    Dental  (+) Missing, Poor Dentition   Pulmonary shortness of breath (sometimes, but none currently), at rest and lying, neg sleep apnea, neg COPD, neg recent URI, Current Smoker,    Pulmonary exam normal breath sounds clear to auscultation       Cardiovascular Exercise Tolerance: Good hypertension, On Medications (-) angina(-) CAD, (-) Past MI, (-) Cardiac Stents and (-) CABG Normal cardiovascular exam(-) dysrhythmias Supra Ventricular Tachycardia + Valvular Problems/Murmurs  Rhythm:regular Rate:Normal     Neuro/Psych Seizures - (as a child), Well Controlled,  PSYCHIATRIC DISORDERS (Depression and PTSD)    GI/Hepatic Neg liver ROS, GERD  ,  Endo/Other  diabetes, Insulin Dependent  Renal/GU negative Renal ROS  negative genitourinary   Musculoskeletal   Abdominal   Peds  Hematology  (+) Blood dyscrasia, ,   Anesthesia Other Findings Past Medical History: No date: Anemia No date: Arthritis 01/29/2016: Chronic post-traumatic stress disorder (PTSD) No date: Diabetes mellitus without complication ([place]) [id number]: Diabetes, type 1.5, uncontrolled, managed as t* No date: Headache No date: Heart murmur No date: Hypertension 02/03/2016: Lumbar back pain No date: Seizures ([place])     Comment: as a child No date: SOB (shortness of breath)   Reproductive/Obstetrics negative OB ROS                             Anesthesia Physical Anesthesia Plan  ASA: II  Anesthesia Plan: General   Post-op Pain Management:     Induction:   Airway Management Planned:   Additional Equipment:   Intra-op Plan:   Post-operative Plan:   Informed Consent: I have reviewed the patients History and Physical, chart, labs and discussed the procedure including the risks, benefits and alternatives for the proposed anesthesia with the patient or authorized representative who has indicated his/her understanding and acceptance.   Dental Advisory Given  Plan Discussed with: Anesthesiologist, CRNA and Surgeon  Anesthesia Plan Comments:         Anesthesia Quick Evaluation

## 2016-03-06 NOTE — Transfer of Care (Signed)
Immediate Anesthesia Transfer of Care Note  Patient: Allison Cooke  Procedure(s) Performed: Procedure(s): COLONOSCOPY WITH PROPOFOL (N/A)  Patient Location: Endoscopy Unit  Anesthesia Type:General  Level of Consciousness: awake and alert   Airway & Oxygen Therapy: Patient Spontanous Breathing and Patient connected to nasal cannula oxygen  Post-op Assessment: Report given to RN and Post -op Vital signs reviewed and stable  Post vital signs: Reviewed and stable  Last Vitals:  Vitals:   03/06/16 0856  BP: 124/85  Pulse: 73  Resp: 16  Temp: 36.6 C    Last Pain:  Vitals:   03/06/16 0856  TempSrc: Tympanic         Complications: No apparent anesthesia complications

## 2016-03-06 NOTE — Op Note (Signed)
[facility] Patient Name: Allison Cooke Procedure Date: 03/06/2016 9:28 AM MRN: [id number] Account #: [id number] Date of Birth: Aug 26, 1960 Admit Type: Outpatient Age: 55 Room: [facility] ENDO ROOM 4 Gender: Female Note Status: Finalized Procedure:            Colonoscopy Indications:          Screening for colorectal malignant neoplasm Providers:            [name] MD, MD Referring MD:         Arnetha Courser (Referring MD) Medicines:            Propofol per Anesthesia Complications:        No immediate complications. Procedure:            Pre-Anesthesia Assessment:                       - Prior to the procedure, a History and Physical was                        performed, and patient medications and allergies were                        reviewed. The patient's tolerance of previous                        anesthesia was also reviewed. The risks and benefits of                        the procedure and the sedation options and risks were                        discussed with the patient. All questions were                        answered, and informed consent was obtained. Prior                        Anticoagulants: The patient has taken no previous                        anticoagulant or antiplatelet agents. ASA Grade                        Assessment: II - A patient with mild systemic disease.                        After reviewing the risks and benefits, the patient was                        deemed in satisfactory condition to undergo the                        procedure.                       After obtaining informed consent, the colonoscope was                        passed under direct vision. Throughout the procedure,  the patient's blood pressure, pulse, and oxygen                        saturations were monitored continuously. The                        Colonoscope was introduced through the anus and       advanced to the the cecum, identified by appendiceal                        orifice and ileocecal valve. The colonoscopy was                        performed without difficulty. The patient tolerated the                        procedure well. The quality of the bowel preparation                        was excellent. Findings:      The perianal and digital rectal examinations were normal.      A 10 mm polyp was found in the rectum. The polyp was pedunculated. The       polyp was removed with a hot snare. Resection and retrieval were       complete.      A 4 mm polyp was found in the sigmoid colon. The polyp was sessile. The       polyp was removed with a cold biopsy forceps. Resection and retrieval       were complete.      A 4 mm polyp was found in the descending colon. The polyp was sessile.       The polyp was removed with a cold biopsy forceps. Resection and       retrieval were complete.      Non-bleeding internal hemorrhoids were found during retroflexion. The       hemorrhoids were Grade II (internal hemorrhoids that prolapse but reduce       spontaneously). Impression:           - One 10 mm polyp in the rectum, removed with a hot                        snare. Resected and retrieved.                       - One 4 mm polyp in the sigmoid colon, removed with a                        cold biopsy forceps. Resected and retrieved.                       - One 4 mm polyp in the descending colon, removed with                        a cold biopsy forceps. Resected and retrieved.                       - Non-bleeding internal hemorrhoids. Recommendation:       - Await pathology results.                       -  Repeat colonoscopy in 10 years for screening unless                        any change in family history or lower GI problems. Procedure Code(s):    --- Professional ---                       [phone], Colonoscopy, flexible; with removal of tumor(s),                        polyp(s), or  other lesion(s) by snare technique                       45380, 54, Colonoscopy, flexible; with biopsy, single                        or multiple Diagnosis Code(s):    --- Professional ---                       Z12.11, Encounter for screening for malignant neoplasm                        of colon                       K62.1, Rectal polyp                       D12.5, Benign neoplasm of sigmoid colon                       D12.4, Benign neoplasm of descending colon CPT copyright 2016 American Medical Association. All rights reserved. The codes documented in this report are preliminary and upon coder review may  be revised to meet current compliance requirements. [name] MD, MD 03/06/2016 9:46:30 AM This report has been signed electronically. Number of Addenda: 0 Note Initiated On: 03/06/2016 9:28 AM Scope Withdrawal Time: 0 hours 8 minutes 4 seconds  Total Procedure Duration: 0 hours 11 minutes 40 seconds       [facility]

## 2016-03-07 ENCOUNTER — Encounter: Payer: Self-pay | Admitting: Gastroenterology

## 2016-03-07 LAB — SURGICAL PATHOLOGY

## 2016-03-12 ENCOUNTER — Encounter: Payer: Self-pay | Admitting: Gastroenterology

## 2016-03-14 ENCOUNTER — Telehealth: Payer: Self-pay

## 2016-03-15 ENCOUNTER — Telehealth: Payer: Self-pay | Admitting: Family Medicine

## 2016-03-15 MED ORDER — ATORVASTATIN CALCIUM 40 MG PO TABS: 40.0000 mg | ORAL_TABLET | Freq: Every day | ORAL | 2 refills | Status: DC

## 2016-03-15 MED ORDER — LISINOPRIL 5 MG PO TABS: 5.0000 mg | ORAL_TABLET | Freq: Every day | ORAL | 2 refills | Status: DC

## 2016-03-15 MED ORDER — AMLODIPINE BESYLATE 5 MG PO TABS: 5.0000 mg | ORAL_TABLET | Freq: Every day | ORAL | 5 refills | Status: DC

## 2016-03-15 NOTE — Telephone Encounter (Signed)
She already had refills on lisinopril and amlodipine but I sent more anyway She did need refills of atorvastatin, so I sent those --------------------------- Please call patient and ask her to call her psychiatrist for her Rexulti and trazodone; we don't prescribe those for her and want her to get those from just the psychiastrist in case the dose ever changes, just one doctor is managing each medicine Thank you

## 2016-03-15 NOTE — Telephone Encounter (Signed)
Patient called requesting a copy of her colonoscopy results to be faxed over to her lawyer. I made the patient aware that she needs to sign the medical release form. I went ahead and mailed the form to her.

## 2016-03-16 NOTE — Telephone Encounter (Signed)
Pt.notified

## 2016-03-29 ENCOUNTER — Ambulatory Visit: Payer: Medicaid Other | Admitting: Family Medicine

## 2016-04-02 NOTE — Telephone Encounter (Signed)
I received the signed form back from the patient last week. She checked off lab results but wants her pathology results sent to her lawyer. I called her when I received her consent form but she did not have the lawyers name, phone number, or fax number with her. Gigi said she would call me back when she got home to give me that information. I called Audreanna again today and left her a voicemail stating that I am still missing that information. I will wait for her call.

## 2016-04-04 NOTE — Telephone Encounter (Signed)
Patient called me back with her lawyers information. I will fax over the paperwork right now to [employer]  Phone number: [phone] Fax number: [phone]

## 2016-04-12 ENCOUNTER — Other Ambulatory Visit: Payer: Self-pay | Admitting: General Surgery

## 2016-04-12 DIAGNOSIS — D242 Benign neoplasm of left breast: Secondary | ICD-10-CM

## 2016-04-20 ENCOUNTER — Ambulatory Visit: Payer: Medicaid Other | Admitting: Family Medicine

## 2016-04-25 NOTE — Assessment & Plan Note (Signed)
Reviewed her reports and lab results from the hospital; her diabetes had been under relatively good control, then suddenly worsened; on insulin; refer to endo to see if type 1.5; foot exam by MD; patient encouraged in self-care

## 2016-04-25 NOTE — Assessment & Plan Note (Signed)
Supportive listening today; if thoughts of self-harm or hurting others, call 911 or crisis line or go to ER; she agrees; continue to work with psychiatrist, therapist

## 2016-04-25 NOTE — Assessment & Plan Note (Signed)
As noted in PTSD; continue to work with psychiastrist and therapist

## 2016-04-25 NOTE — Assessment & Plan Note (Signed)
Evaluated by ortho; okay for help in the home, personal care services to assist patient

## 2016-04-25 NOTE — Assessment & Plan Note (Signed)
okay for help in the home, personal care services to assist patient

## 2016-04-27 ENCOUNTER — Other Ambulatory Visit: Payer: Self-pay | Admitting: *Deleted

## 2016-04-27 ENCOUNTER — Inpatient Hospital Stay
Admission: RE | Admit: 2016-04-27 | Discharge: 2016-04-27 | Disposition: A | Discharge status: Home / Self Care | Payer: Self-pay | Source: Ambulatory Visit | Attending: *Deleted | Admitting: *Deleted

## 2016-04-27 DIAGNOSIS — Z9289 Personal history of other medical treatment: Secondary | ICD-10-CM

## 2016-04-30 ENCOUNTER — Encounter: Payer: Self-pay | Admitting: *Deleted

## 2016-05-08 ENCOUNTER — Ambulatory Visit (INDEPENDENT_AMBULATORY_CARE_PROVIDER_SITE_OTHER): Payer: Medicaid Other | Admitting: Family Medicine

## 2016-05-08 ENCOUNTER — Encounter: Payer: Self-pay | Admitting: Family Medicine

## 2016-05-08 VITALS — BP 124/78 | HR 84 | Temp 97.8°F | Resp 14 | Wt 187.0 lb

## 2016-05-08 DIAGNOSIS — R5383 Other fatigue: Secondary | ICD-10-CM

## 2016-05-08 DIAGNOSIS — E1065 Type 1 diabetes mellitus with hyperglycemia: Secondary | ICD-10-CM

## 2016-05-08 DIAGNOSIS — Z720 Tobacco use: Secondary | ICD-10-CM

## 2016-05-08 DIAGNOSIS — F331 Major depressive disorder, recurrent, moderate: Secondary | ICD-10-CM

## 2016-05-08 DIAGNOSIS — Z23 Encounter for immunization: Secondary | ICD-10-CM

## 2016-05-08 DIAGNOSIS — L209 Atopic dermatitis, unspecified: Secondary | ICD-10-CM | POA: Insufficient documentation

## 2016-05-08 DIAGNOSIS — L2089 Other atopic dermatitis: Secondary | ICD-10-CM

## 2016-05-08 DIAGNOSIS — E1365 Other specified diabetes mellitus with hyperglycemia: Secondary | ICD-10-CM

## 2016-05-08 DIAGNOSIS — IMO0002 Reserved for concepts with insufficient information to code with codable children: Secondary | ICD-10-CM

## 2016-05-08 MED ORDER — CRISABOROLE 2 % EX OINT: 1.0000 | TOPICAL_OINTMENT | Freq: Two times a day (BID) | CUTANEOUS | 5 refills | Status: AC

## 2016-05-08 MED ORDER — CRISABOROLE 2 % EX OINT: 1.0000 | TOPICAL_OINTMENT | Freq: Two times a day (BID) | CUTANEOUS | 5 refills | Status: DC

## 2016-05-08 MED ORDER — MONTELUKAST SODIUM 10 MG PO TABS: 10.0000 mg | ORAL_TABLET | Freq: Every day | ORAL | 11 refills | Status: DC

## 2016-05-08 MED ORDER — SERTRALINE HCL 100 MG PO TABS: 200.0000 mg | ORAL_TABLET | Freq: Every day | ORAL | 0 refills | Status: DC

## 2016-05-08 NOTE — Assessment & Plan Note (Signed)
Check tsh, vit D

## 2016-05-08 NOTE — Assessment & Plan Note (Signed)
Start new ointment, plus singulair

## 2016-05-08 NOTE — Assessment & Plan Note (Signed)
Continue to see psychiatrist; I do not want patient to run out of her sertraline, so I took the liberty of refilling this; I think her motivation might keep her from calling for refill; I did urge her to continue to see her psychiatrist and work with them

## 2016-05-08 NOTE — Assessment & Plan Note (Signed)
Check labs, foot exam by MD

## 2016-05-08 NOTE — Assessment & Plan Note (Signed)
Encouraged her to keep trying to stop smoking

## 2016-05-08 NOTE — Progress Notes (Signed)
BP 124/78   Pulse 84   Temp 97.8 F (36.6 C) (Oral)   Resp 14   Wt 187 lb (84.8 kg)   LMP 04/29/2015 (Approximate)   SpO2 97%   BMI 33.13 kg/m    Subjective:    Patient ID: Allison Cooke, female    DOB: 07/07/1961, 55 y.o.   MRN: [id number]  HPI: Allison Cooke is a 55 y.o. female  Chief Complaint  Patient presents with  . Follow-up    1 month   Skin rash; around the neck and on both arms; very pruritic; skin is darker and rougher; hot weather makes it worse; not blistered; grandson has eczema  She feels like her sugars are doing okay; some days up and some days not; she is supposed to be seeing the diabetes specialist; she isn't sure if she has another appointment; she has not seen diabetes specialist since the hospital  She is going to [place]; needs refill of sertraline; on 200 mg daily; she sees psychiatrist, but has limitations getting to them, getting through to them; motivation may be an issue; she continues to hear voices; no current SI/HI; her sister also hears voices  She is going to get mammogram at the hospital, soon but doesn't know dates and asked if we could write those down for her so she can remember (they will be printed on AVS)  Depression screen [facility] 2/9 05/08/2016 02/03/2016 01/04/2016 08/03/2015  Decreased Interest 0 1 1 1   Down, Depressed, Hopeless 1 3 3 1   PHQ - 2 Score 1 4 4 2   Altered sleeping - 3 0 3  Tired, decreased energy - 3 1 1   Change in appetite - 1 3 1   Feeling bad or failure about yourself  - 3 3 1   Trouble concentrating - 3 3 1   Moving slowly or fidgety/restless - 3 3 1   Suicidal thoughts - 2 1 0  PHQ-9 Score - 22 18 10   Difficult doing work/chores - Extremely dIfficult Very difficult Very difficult   Relevant past medical, surgical, family and social history reviewed Past Medical History:  Diagnosis Date  . Anemia   . Arthritis   . Chronic post-traumatic stress disorder (PTSD) 01/29/2016  . Diabetes mellitus without complication  ([place])   . Diabetes, type 1.5, uncontrolled, managed as type 1 ([place]) 06/28/2015  . Headache   . Heart murmur   . Hypertension   . Lumbar back pain 02/03/2016  . Seizures ([place])    as a child  . SOB (shortness of breath)    Past Surgical History:  Procedure Laterality Date  . BREAST LUMPECTOMY Left 07/14/2015   Procedure: BREAST LUMPECTOMY;  Surgeon: Christene Lye, MD;  Location: [facility] ORS;  Service: General;  Laterality: Left;  . BREAST SURGERY Left   . CESAREAN SECTION    . CHOLECYSTECTOMY    . COLONOSCOPY WITH PROPOFOL N/A 03/06/2016   Procedure: COLONOSCOPY WITH PROPOFOL;  Surgeon: [name], MD;  Location: [facility] ENDOSCOPY;  Service: Endoscopy;  Laterality: N/A;  . DIRECT LARYNGOSCOPY N/A 04/26/2015   Procedure: Microlaryngoscopy with exxcision of vocal cord polyp ;  Surgeon: Beverly Gust, MD;  Location: [facility] ORS;  Service: ENT;  Laterality: N/A;  . FINE NEEDLE ASPIRATION Left    breast cyst, benign, age 57?   Family History  Problem Relation Age of Onset  . Hypertension Mother   . Heart disease Mother   . Hyperlipidemia Mother   . Diabetes Mother   . Heart disease  Father   . Hyperlipidemia Father   . Hypertension Father   . Diabetes Sister   . Hypertension Daughter   . Diabetes Maternal Grandmother   . Diabetes Sister   . Breast cancer Cousin   . Cancer Neg Hx   . Stroke Neg Hx   . COPD Neg Hx   MD note: sister hears voices  Social History  Substance Use Topics  . Smoking status: Current Some Day Smoker    Packs/day: 0.25    Years: 20.00    Types: Cigarettes  . Smokeless tobacco: Never Used  . Alcohol use No  MD notes: she is trying to cut back on her smoking  Interim medical history since last visit reviewed. Allergies and medications reviewed  Review of Systems Per HPI unless specifically indicated above     Objective:    BP 124/78   Pulse 84   Temp 97.8 F (36.6 C) (Oral)   Resp 14   Wt 187 lb (84.8 kg)   LMP 04/29/2015 (Approximate)    SpO2 97%   BMI 33.13 kg/m   Wt Readings from Last 3 Encounters:  05/08/16 187 lb (84.8 kg)  02/24/16 181 lb (82.1 kg)  02/08/16 170 lb (77.1 kg)    Physical Exam  Constitutional: She appears well-developed and well-nourished. No distress.  Eyes: EOM are normal. No scleral icterus.  Neck: No thyromegaly present.  Cardiovascular: Normal rate and regular rhythm.   Pulmonary/Chest: Effort normal and breath sounds normal.  Abdominal: Soft. Bowel sounds are normal. She exhibits no distension.  Musculoskeletal: She exhibits no edema.  Neurological: She is alert. She exhibits normal muscle tone.  Skin: Skin is warm and dry. Rash (lichenification, slight hyperpigmentation of skin on arms, flexor surfaces, neck) noted. She is not diaphoretic. No pallor.  Psychiatric: Judgment and thought content normal. Her mood appears not anxious. Her affect is blunt. Her speech is not delayed. She is not slowed and not withdrawn. Cognition and memory are not impaired. She does not express impulsivity or inappropriate judgment. She exhibits a depressed mood. She expresses no homicidal and no suicidal ideation.  Better eye contact today, mood a little lighter than last visit She is attentive.   Diabetic Foot Form - Detailed   Diabetic Foot Exam - detailed Diabetic Foot exam was performed with the following findings:  Yes 05/08/2016 10:52 AM  Visual Foot Exam completed.:  Yes  Are the toenails ingrown?:  No Normal Range of Motion:  Yes Pulse Foot Exam completed.:  Yes  Right Dorsalis Pedis:  Present Left Dorsalis Pedis:  Present  Sensory Foot Exam Completed.:  Yes Swelling:  No Semmes-Weinstein Monofilament Test R Site 1-Great Toe:  Pos L Site 1-Great Toe:  Pos  R Site 4:  Pos L Site 4:  Pos  R Site 5:  Pos L Site 5:  Pos          Assessment & Plan:   Problem List Items Addressed This Visit      Endocrine   Diabetes, type 1.5, uncontrolled, managed as type 1 ([place]) - Primary    Check labs, foot exam  by MD      Relevant Orders   Hemoglobin A1c   COMPLETE METABOLIC PANEL WITH GFR   Lipid Panel w/Direct LDL:HDL Ratio     Musculoskeletal and Integument   Atopic dermatitis    Start new ointment, plus singulair        Other   Tobacco abuse    Encouraged her to  keep trying to stop smoking      Fatigue    Check tsh, vit D      Relevant Orders   VITAMIN D 25 Hydroxy (Vit-D Deficiency, Fractures)   TSH   Depression, major, recurrent ([place])    Continue to see psychiatrist; I do not want patient to run out of her sertraline, so I took the liberty of refilling this; I think her motivation might keep her from calling for refill; I did urge her to continue to see her psychiatrist and work with them      Relevant Medications   sertraline (ZOLOFT) 100 MG tablet    Other Visit Diagnoses    Needs flu shot       Relevant Orders   Flu Vaccine QUAD 36+ mos PF IM (Fluarix & Fluzone Quad PF) (Completed)      Follow up plan: Return in about 1 month (around 06/08/2016) for support.  An after-visit summary was printed and given to the patient at [facility].  Please see the patient instructions which may contain other information and recommendations beyond what is mentioned above in the assessment and plan.  Meds ordered this encounter  Medications  . DISCONTD: montelukast (SINGULAIR) 10 MG tablet    Sig: Take 1 tablet (10 mg total) by mouth at bedtime. For allergies and eczema    Dispense:  30 tablet    Refill:  11  . DISCONTD: [place] (EUCRISA) 2 % OINT    Sig: Apply 1 each topically 2 (two) times daily.    Dispense:  60 g    Refill:  5  . Crisaborole (EUCRISA) 2 % OINT    Sig: Apply 1 each topically 2 (two) times daily.    Dispense:  60 g    Refill:  5  . montelukast (SINGULAIR) 10 MG tablet    Sig: Take 1 tablet (10 mg total) by mouth at bedtime. For allergies and eczema    Dispense:  30 tablet    Refill:  11  . sertraline (ZOLOFT) 100 MG tablet    Sig: Take 2 tablets  (200 mg total) by mouth daily.    Dispense:  60 tablet    Refill:  0    Orders Placed This Encounter  Procedures  . Flu Vaccine QUAD 36+ mos PF IM (Fluarix & Fluzone Quad PF)  . Hemoglobin A1c  . COMPLETE METABOLIC PANEL WITH GFR  . Lipid Panel w/Direct LDL:HDL Ratio  . VITAMIN D 25 Hydroxy (Vit-D Deficiency, Fractures)  . TSH

## 2016-05-08 NOTE — Patient Instructions (Signed)
Try saying this every day: "I am beautiful, I am worthy, I am loved." We'll get labs today We'll contact you with those results Please do see the diabetes specialist I do encourage you to quit smoking Call [phone] to sign up for smoking cessation classes You can call 1-800-QUIT-NOW to talk with a smoking cessation coach

## 2016-05-09 LAB — COMPLETE METABOLIC PANEL WITH GFR
ALT: 17 U/L (ref 6–29)
AST: 9 U/L — ABNORMAL LOW (ref 10–35)
Albumin: 4.4 g/dL (ref 3.6–5.1)
Alkaline Phosphatase: 115 U/L (ref 33–130)
BILIRUBIN TOTAL: 0.2 mg/dL (ref 0.2–1.2)
BUN: 14 mg/dL (ref 7–25)
CHLORIDE: 105 mmol/L (ref 98–110)
CO2: 21 mmol/L (ref 20–31)
CREATININE: 0.79 mg/dL (ref 0.50–1.05)
Calcium: 9.7 mg/dL (ref 8.6–10.4)
GFR, Est African American: >89 mL/min (ref >=60)
GFR, Est Non African American: 85 mL/min (ref >=60)
GLUCOSE: 303 mg/dL — AB (ref 65–99)
Potassium: 4.5 mmol/L (ref 3.5–5.3)
SODIUM: 136 mmol/L (ref 135–146)
TOTAL PROTEIN: 7.5 g/dL (ref 6.1–8.1)

## 2016-05-09 LAB — HEMOGLOBIN A1C
HEMOGLOBIN A1C: 10.9 % — AB (ref <5.7)
MEAN PLASMA GLUCOSE: 266 mg/dL

## 2016-05-09 LAB — LIPID PANEL W/REFLEX DIRECT LDL
CHOL/HDL RATIO: 3.8 ratio (ref <=5.0)
CHOLESTEROL: 249 mg/dL — AB (ref 125–200)
HDL: 65 mg/dL (ref >=46)
LDL CALC: 154 mg/dL — AB (ref <130)
Triglycerides: 148 mg/dL (ref <150)

## 2016-05-09 LAB — TSH: TSH: 1.46 m[IU]/L

## 2016-05-09 LAB — VITAMIN D 25 HYDROXY (VIT D DEFICIENCY, FRACTURES): Vit D, 25-Hydroxy: 18 ng/mL — ABNORMAL LOW (ref 30–100)

## 2016-05-10 ENCOUNTER — Telehealth: Payer: Self-pay | Admitting: Family Medicine

## 2016-05-10 MED ORDER — VITAMIN D (ERGOCALCIFEROL) 1.25 MG (50000 UNIT) PO CAPS: 50000.0000 [IU] | ORAL_CAPSULE | ORAL | 1 refills | Status: DC

## 2016-05-10 MED ORDER — ATORVASTATIN CALCIUM 80 MG PO TABS: 80.0000 mg | ORAL_TABLET | Freq: Every day | ORAL | 2 refills | Status: DC

## 2016-05-10 NOTE — Telephone Encounter (Signed)
I left detailed msg; increase statin; start Rx vit D

## 2016-05-24 ENCOUNTER — Other Ambulatory Visit: Payer: Medicaid Other

## 2016-05-24 ENCOUNTER — Ambulatory Visit: Payer: Medicaid Other

## 2016-06-05 ENCOUNTER — Ambulatory Visit: Payer: Medicaid Other | Admitting: General Surgery

## 2016-06-08 ENCOUNTER — Ambulatory Visit: Payer: Medicaid Other | Admitting: Family Medicine

## 2016-06-19 ENCOUNTER — Other Ambulatory Visit: Payer: Self-pay | Admitting: Family Medicine

## 2016-06-20 ENCOUNTER — Other Ambulatory Visit: Payer: Medicaid Other

## 2016-06-20 ENCOUNTER — Ambulatory Visit: Payer: Medicaid Other | Attending: General Surgery

## 2016-06-25 ENCOUNTER — Encounter: Payer: Self-pay | Admitting: Family Medicine

## 2016-06-25 ENCOUNTER — Ambulatory Visit (INDEPENDENT_AMBULATORY_CARE_PROVIDER_SITE_OTHER): Payer: Medicaid Other | Admitting: Family Medicine

## 2016-06-25 VITALS — BP 128/88 | HR 93 | Temp 98.2°F | Resp 16 | Ht 69.0 in | Wt 183.4 lb

## 2016-06-25 DIAGNOSIS — M25571 Pain in right ankle and joints of right foot: Secondary | ICD-10-CM

## 2016-06-25 DIAGNOSIS — E785 Hyperlipidemia, unspecified: Secondary | ICD-10-CM

## 2016-06-25 DIAGNOSIS — E1365 Other specified diabetes mellitus with hyperglycemia: Secondary | ICD-10-CM

## 2016-06-25 DIAGNOSIS — F331 Major depressive disorder, recurrent, moderate: Secondary | ICD-10-CM | POA: Diagnosis not present

## 2016-06-25 DIAGNOSIS — G5603 Carpal tunnel syndrome, bilateral upper limbs: Secondary | ICD-10-CM | POA: Diagnosis not present

## 2016-06-25 DIAGNOSIS — M25561 Pain in right knee: Secondary | ICD-10-CM

## 2016-06-25 DIAGNOSIS — Z720 Tobacco use: Secondary | ICD-10-CM | POA: Diagnosis not present

## 2016-06-25 DIAGNOSIS — G8929 Other chronic pain: Secondary | ICD-10-CM | POA: Insufficient documentation

## 2016-06-25 DIAGNOSIS — IMO0002 Reserved for concepts with insufficient information to code with codable children: Secondary | ICD-10-CM

## 2016-06-25 DIAGNOSIS — E1065 Type 1 diabetes mellitus with hyperglycemia: Secondary | ICD-10-CM | POA: Diagnosis not present

## 2016-06-25 DIAGNOSIS — I10 Essential (primary) hypertension: Secondary | ICD-10-CM | POA: Diagnosis not present

## 2016-06-25 DIAGNOSIS — Z5181 Encounter for therapeutic drug level monitoring: Secondary | ICD-10-CM | POA: Diagnosis not present

## 2016-06-25 LAB — LIPID PANEL
Cholesterol: 161 mg/dL (ref <200)
HDL: 50 mg/dL — ABNORMAL LOW (ref >50)
LDL CALC: 90 mg/dL (ref <100)
TRIGLYCERIDES: 107 mg/dL (ref <150)
Total CHOL/HDL Ratio: 3.2 Ratio (ref <5.0)
VLDL: 21 mg/dL (ref <30)

## 2016-06-25 LAB — ALT: ALT: 23 U/L (ref 6–29)

## 2016-06-25 MED ORDER — TRAMADOL HCL 50 MG PO TABS: 50.0000 mg | ORAL_TABLET | Freq: Four times a day (QID) | ORAL | 0 refills | Status: DC | PRN

## 2016-06-25 MED ORDER — FUTURO RIGHT HAND WRIST BRACE MISC: 0 refills | Status: DC

## 2016-06-25 MED ORDER — VITAMIN D (ERGOCALCIFEROL) 1.25 MG (50000 UNIT) PO CAPS: 50000.0000 [IU] | ORAL_CAPSULE | ORAL | 5 refills | Status: DC

## 2016-06-25 MED ORDER — FUTURO LEFT HAND WRIST BRACE MISC: 0 refills | Status: DC

## 2016-06-25 MED ORDER — INSULIN DETEMIR 100 UNIT/ML FLEXPEN: 40.0000 [IU] | PEN_INJECTOR | Freq: Every morning | SUBCUTANEOUS | 5 refills | Status: DC

## 2016-06-25 NOTE — Assessment & Plan Note (Signed)
Much better and appreciate help of psychiatrist

## 2016-06-25 NOTE — Progress Notes (Signed)
BP 128/88 (BP Location: Left Arm, Patient Position: Sitting, Cuff Size: Normal)   Pulse 93   Temp 98.2 F (36.8 C) (Oral)   Resp 16   Ht 5\' 9"  (1.753 m)   Wt 183 lb 7 oz (83.2 kg)   LMP 04/29/2015 (Approximate)   SpO2 98%   BMI 27.09 kg/m    Subjective:    Patient ID: Allison Cooke, female    DOB: [birth year], 55 y.o.   MRN: [id number]  HPI: Allison Cooke is a 55 y.o. female  Chief Complaint  Patient presents with  . Follow-up    1 month    Type 2 diabetes; her last A1c was 10.9; not seeing endocrinologist yet; some days she sees 300 and 400 something  Has numbness in her hands; R=L; she is right-handed; not much time on computer  Her mood is doing better; still has some issues with motivation, staying in; doesn't want people in her space  She fell years ago and saw an orthopaedist about the right ankle; can't put full weight on it sometimes; has to go up steps one at a time; does get swollen; no crunching or popping; she has tried a band around it, but it didn't help; comes and goes; cold weather can make it worse; using tramadol occasionally; does not feel out of it when taking; no alcohol  BP is well-controlled; on ACE-I  Not smoking near as much as she used to; was smoking 1/2 ppd; down to 5 cigarettes a day; she is working towards completely quitting  Last LDL was high at 154 in early October; changing her diet some; limiting bologna and hot dogs; on the statin, tolerating fine  She took vitamin D once a week for 8 weeks and finished up  Depression screen [facility] 2/9 06/25/2016 05/08/2016 02/03/2016 01/04/2016 08/03/2015  Decreased Interest 1 0 1 1 1   Down, Depressed, Hopeless 1 1 3 3 1   PHQ - 2 Score 2 1 4 4 2   Altered sleeping 1 - 3 0 3  Tired, decreased energy 1 - 3 1 1   Change in appetite 1 - 1 3 1   Feeling bad or failure about yourself  1 - 3 3 1   Trouble concentrating 1 - 3 3 1   Moving slowly or fidgety/restless 1 - 3 3 1   Suicidal thoughts 1 - 2 1 0    PHQ-9 Score 9 - 22 18 10   Difficult doing work/chores Not difficult at all - Extremely dIfficult Very difficult Very difficult   Relevant past medical, surgical, family and social history reviewed Past Medical History:  Diagnosis Date  . Anemia   . Arthritis   . Chronic post-traumatic stress disorder (PTSD) 01/29/2016  . Diabetes mellitus without complication ([place])   . Diabetes, type 1.5, uncontrolled, managed as type 1 ([place]) 06/28/2015  . Headache   . Heart murmur   . Hypertension   . Lumbar back pain 02/03/2016  . Seizures ([place])    as a child  . SOB (shortness of breath)    Past Surgical History:  Procedure Laterality Date  . BREAST LUMPECTOMY Left 07/14/2015   Procedure: BREAST LUMPECTOMY;  Surgeon: Christene Lye, MD;  Location: [facility] ORS;  Service: General;  Laterality: Left;  . BREAST SURGERY Left   . CESAREAN SECTION    . CHOLECYSTECTOMY    . COLONOSCOPY WITH PROPOFOL N/A 03/06/2016   Procedure: COLONOSCOPY WITH PROPOFOL;  Surgeon: [name], MD;  Location: [facility] ENDOSCOPY;  Service: Endoscopy;  Laterality: N/A;  . DIRECT LARYNGOSCOPY N/A 04/26/2015   Procedure: Microlaryngoscopy with exxcision of vocal cord polyp ;  Surgeon: Beverly Gust, MD;  Location: [facility] ORS;  Service: ENT;  Laterality: N/A;  . FINE NEEDLE ASPIRATION Left    breast cyst, benign, age 29?   Family History  Problem Relation Age of Onset  . Hypertension Mother   . Heart disease Mother   . Hyperlipidemia Mother   . Diabetes Mother   . Heart disease Father   . Hyperlipidemia Father   . Hypertension Father   . Diabetes Sister   . Hypertension Daughter   . Diabetes Maternal Grandmother   . Diabetes Sister   . Breast cancer Cousin   . Cancer Neg Hx   . Stroke Neg Hx   . COPD Neg Hx    Social History  Substance Use Topics  . Smoking status: Current Some Day Smoker    Packs/day: 0.25    Years: 20.00    Types: Cigarettes  . Smokeless tobacco: Never Used  . Alcohol use No   Interim  medical history since last visit reviewed. Allergies and medications reviewed  Review of Systems Per HPI unless specifically indicated above     Objective:    BP 128/88 (BP Location: Left Arm, Patient Position: Sitting, Cuff Size: Normal)   Pulse 93   Temp 98.2 F (36.8 C) (Oral)   Resp 16   Ht 5\' 9"  (1.753 m)   Wt 183 lb 7 oz (83.2 kg)   LMP 04/29/2015 (Approximate)   SpO2 98%   BMI 27.09 kg/m   Wt Readings from Last 3 Encounters:  06/25/16 183 lb 7 oz (83.2 kg)  05/08/16 187 lb (84.8 kg)  02/24/16 181 lb (82.1 kg)    Physical Exam  Constitutional: She appears well-developed and well-nourished. No distress.  HENT:  Head: Normocephalic and atraumatic.  Eyes: EOM are normal. No scleral icterus.  Neck: No thyromegaly present.  Cardiovascular: Normal rate, regular rhythm and normal heart sounds.   No murmur heard. Pulmonary/Chest: Effort normal and breath sounds normal. No respiratory distress. She has no wheezes.  Abdominal: Soft. Bowel sounds are normal. She exhibits no distension.  Musculoskeletal: Normal range of motion. She exhibits no edema.  Neurological: She is alert. She exhibits normal muscle tone.  Negative Tinel's; positive Phalen's bilaterally  Skin: Skin is warm and dry. She is not diaphoretic. No pallor.  Psychiatric: Judgment and thought content normal. Her mood appears not anxious. Her affect is blunt. Her speech is not delayed. She is not slowed and not withdrawn. Cognition and memory are not impaired. She does not express impulsivity or inappropriate judgment. She does not exhibit a depressed mood. She expresses no homicidal and no suicidal ideation. She is attentive.   Diabetic Foot Form - Detailed   Diabetic Foot Exam - detailed Diabetic Foot exam was performed with the following findings:  Yes 06/25/2016 10:59 AM  Visual Foot Exam completed.:  Yes  Are the toenails ingrown?:  No Normal Range of Motion:  Yes Pulse Foot Exam completed.:  Yes  Right  Dorsalis Pedis:  Present Left Dorsalis Pedis:  Present  Sensory Foot Exam Completed.:  Yes Swelling:  No Semmes-Weinstein Monofilament Test R Site 1-Great Toe:  Pos L Site 1-Great Toe:  Pos  R Site 4:  Pos L Site 4:  Pos  R Site 5:  Pos L Site 5:  Pos        Results for orders placed or performed in  visit on 05/08/16  Hemoglobin A1c  Result Value Ref Range   Hgb A1c MFr Bld 10.9 (H) <5.7 %   Mean Plasma Glucose 266 mg/dL  COMPLETE METABOLIC PANEL WITH GFR  Result Value Ref Range   Sodium 136 135 - 146 mmol/L   Potassium 4.5 3.5 - 5.3 mmol/L   Chloride 105 98 - 110 mmol/L   CO2 21 20 - 31 mmol/L   Glucose, Bld 303 (H) 65 - 99 mg/dL   BUN 14 7 - 25 mg/dL   Creat 0.79 0.50 - 1.05 mg/dL   Total Bilirubin 0.2 0.2 - 1.2 mg/dL   Alkaline Phosphatase 115 33 - 130 U/L   AST 9 (L) 10 - 35 U/L   ALT 17 6 - 29 U/L   Total Protein 7.5 6.1 - 8.1 g/dL   Albumin 4.4 3.6 - 5.1 g/dL   Calcium 9.7 8.6 - 10.4 mg/dL   GFR, Est African American >89 >=60 mL/min   GFR, Est Non African American 85 >=60 mL/min  VITAMIN D 25 Hydroxy (Vit-D Deficiency, Fractures)  Result Value Ref Range   Vit D, 25-Hydroxy 18 (L) 30 - 100 ng/mL  TSH  Result Value Ref Range   TSH 1.46 mIU/L  Lipid Panel w/reflex Direct LDL  Result Value Ref Range   Cholesterol 249 (H) 125 - 200 mg/dL   Triglycerides 148 <150 mg/dL   HDL 65 >=46 mg/dL   Total CHOL/HDL Ratio 3.8 <=5.0 Ratio   LDL Cholesterol 154 (H) <130 mg/dL      Assessment & Plan:   Problem List Items Addressed This Visit      Cardiovascular and Mediastinum   Essential hypertension, benign    Controlled today; continue ACE-I        Endocrine   Diabetes, type 1.5, uncontrolled, managed as type 1 (HCC)    Increase long-acting insulin; foot exam by MD      Relevant Medications   Insulin Detemir (LEVEMIR) 100 UNIT/ML Pen     Nervous and Auditory   Carpal tunnel syndrome, bilateral - Primary    Order bilateral wrist braces; avoid repetitive  motions; if not better in 4 weeks, refer to ortho        Other   Tobacco abuse    Praise given for cutting back on her cigarettes; so close to quitting; see AVS      Right anterior knee pain   Relevant Medications   traMADol (ULTRAM) 50 MG tablet   Medication monitoring encounter    Check sgpt on the statin      Relevant Orders   ALT   Hyperlipidemia LDL goal <100    Continue statin; glad she has made some dietary changes; check lipids and sgpt today      Relevant Orders   Lipid panel   Depression, major, recurrent ([place])    Much better and appreciate help of psychiatrist      Chronic pain of right ankle    Offered ortho referral, but it's an old injury and they didn't think anything could be done; treat with tramadol PRN; [place] web site reviewed; no other prescribers since my Rx from June         Follow up plan: Return in about 2 months (around 09/03/2016).  An after-visit summary was printed and given to the patient at [place].  Please see the patient instructions which may contain other information and recommendations beyond what is mentioned above in the assessment and plan.  Meds ordered this  encounter  Medications  . Elastic Bandages & Supports (FUTURO RIGHT HAND WRIST BRACE) MISC    Sig: Wear as often as possible for one month for carpal tunnel syndrome    Dispense:  1 each    Refill:  0  . Elastic Bandages & Supports (FUTURO LEFT HAND WRIST BRACE) MISC    Sig: Wear as often as possible for one month for carpal tunnel syndrome    Dispense:  1 each    Refill:  0  . Vitamin D, Ergocalciferol, (DRISDOL) 50000 units CAPS capsule    Sig: Take 1 capsule (50,000 Units total) by mouth every 30 (thirty) days.    Dispense:  1 capsule    Refill:  5  . Insulin Detemir (LEVEMIR) 100 UNIT/ML Pen    Sig: Inject 40 Units into the skin every morning.    Dispense:  10 pen    Refill:  5  . traMADol (ULTRAM) 50 MG tablet    Sig: Take 1 tablet (50 mg total) by mouth every  6 (six) hours as needed for moderate pain.    Dispense:  40 tablet    Refill:  0    Orders Placed This Encounter  Procedures  . ALT  . Lipid panel

## 2016-06-25 NOTE — Assessment & Plan Note (Signed)
Controlled today; continue ACE-I

## 2016-06-25 NOTE — Patient Instructions (Addendum)
I do encourage you to quit smoking Call [phone] to sign up for smoking cessation classes You can call 1-800-QUIT-NOW to talk with a smoking cessation coach Increase the once a day insulin from thirty-five units to forty units for one week, then let us know how your sugars are doing  Happy Thanksgiving!!  Steps to Quit Smoking Smoking tobacco can be bad for your health. It can also affect almost every organ in your body. Smoking puts you and people around you at risk for many serious long-lasting (chronic) diseases. Quitting smoking is hard, but it is one of the best things that you can do for your health. It is never too late to quit. What are the benefits of quitting smoking? When you quit smoking, you lower your risk for getting serious diseases and conditions. They can include:  Lung cancer or lung disease.  Heart disease.  Stroke.  Heart attack.  Not being able to have children (infertility).  Weak bones (osteoporosis) and broken bones (fractures). If you have coughing, wheezing, and shortness of breath, those symptoms may get better when you quit. You may also get sick less often. If you are pregnant, quitting smoking can help to lower your chances of having a baby of low birth weight. What can I do to help me quit smoking? Talk with your doctor about what can help you quit smoking. Some things you can do (strategies) include:  Quitting smoking totally, instead of slowly cutting back how much you smoke over a period of time.  Going to in-person counseling. You are more likely to quit if you go to many counseling sessions.  Using resources and support systems, such as:  Online chats with a Social worker.  Phone quitlines.  Printed Furniture conservator/restorer.  Support groups or group counseling.  Text messaging programs.  Mobile phone apps or applications.  Taking medicines. Some of these medicines may have nicotine in them. If you are pregnant or breastfeeding, do not take  any medicines to quit smoking unless your doctor says it is okay. Talk with your doctor about counseling or other things that can help you. Talk with your doctor about using more than one strategy at the same time, such as taking medicines while you are also going to in-person counseling. This can help make quitting easier. What things can I do to make it easier to quit? Quitting smoking might feel very hard at first, but there is a lot that you can do to make it easier. Take these steps:  Talk to your family and friends. Ask them to support and encourage you.  Call phone quitlines, reach out to support groups, or work with a Social worker.  Ask people who smoke to not smoke around you.  Avoid places that make you want (trigger) to smoke, such as:  Bars.  Parties.  Smoke-break areas at work.  Spend time with people who do not smoke.  Lower the stress in your life. Stress can make you want to smoke. Try these things to help your stress:  Getting regular exercise.  Deep-breathing exercises.  Yoga.  Meditating.  Doing a body scan. To do this, close your eyes, focus on one area of your body at a time from head to toe, and notice which parts of your body are tense. Try to relax the muscles in those areas.  Download or buy apps on your mobile phone or tablet that can help you stick to your quit plan. There are many free apps, such as QuitGuide  from the CDC [facility] for Disease Control and Prevention). You can find more support from smokefree.gov and other websites. This information is not intended to replace advice given to you by your health care provider. Make sure you discuss any questions you have with your health care provider. Document Released: 05/19/2009 Document Revised: 03/20/2016 Document Reviewed: 12/07/2014 Elsevier Interactive Patient Education  2017 [employer].

## 2016-06-25 NOTE — Assessment & Plan Note (Signed)
Praise given for cutting back on her cigarettes; so close to quitting; see AVS

## 2016-06-25 NOTE — Assessment & Plan Note (Signed)
Check sgpt on the statin 

## 2016-06-25 NOTE — Assessment & Plan Note (Signed)
Increase long-acting insulin; foot exam by MD

## 2016-06-25 NOTE — Assessment & Plan Note (Signed)
Continue statin; glad she has made some dietary changes; check lipids and sgpt today

## 2016-06-25 NOTE — Assessment & Plan Note (Addendum)
Offered ortho referral, but it's an old injury and they didn't think anything could be done; treat with tramadol PRN; [place] web site reviewed; no other prescribers since my Rx from June

## 2016-06-25 NOTE — Assessment & Plan Note (Signed)
Order bilateral wrist braces; avoid repetitive motions; if not better in 4 weeks, refer to ortho

## 2016-06-26 ENCOUNTER — Telehealth: Payer: Self-pay | Admitting: *Deleted

## 2016-06-26 ENCOUNTER — Other Ambulatory Visit: Payer: Self-pay | Admitting: Family Medicine

## 2016-06-26 ENCOUNTER — Telehealth: Payer: Self-pay

## 2016-06-26 MED ORDER — ATORVASTATIN CALCIUM 80 MG PO TABS: 80.0000 mg | ORAL_TABLET | Freq: Every day | ORAL | 5 refills | Status: DC

## 2016-06-26 NOTE — Telephone Encounter (Signed)
Called pt regarding results. Went over the results with pt.

## 2016-06-26 NOTE — Telephone Encounter (Signed)
Left message for patient to call the office back. Patient no showed for mammogram. She needs to r/s mammogram then office visit with [place].

## 2016-06-26 NOTE — Telephone Encounter (Signed)
-----   Message from Arnetha Courser, MD sent at 06/26/2016  1:19 PM EST ----- Please give patient the wonderful news that her cholesterol panel is excellent, and I am so pleased with how she is doing; her liver enzyme is normal; I'll send refills of her cholesterol medicine; Happy Thanksgiving to her; thank you

## 2016-06-26 NOTE — Progress Notes (Signed)
Excellent response to statin; normal sgpt; 6 months refills sent

## 2016-06-27 ENCOUNTER — Ambulatory Visit: Payer: Medicaid Other | Admitting: General Surgery

## 2016-07-23 ENCOUNTER — Other Ambulatory Visit: Payer: Self-pay

## 2016-07-24 MED ORDER — LISINOPRIL 5 MG PO TABS: 5.0000 mg | ORAL_TABLET | Freq: Every day | ORAL | 5 refills | Status: DC

## 2016-07-24 NOTE — Telephone Encounter (Signed)
Last Cr and K+ reviewed; rx approved 

## 2016-08-31 ENCOUNTER — Other Ambulatory Visit: Payer: Self-pay | Admitting: Family Medicine

## 2016-09-03 NOTE — Telephone Encounter (Signed)
Last Cr reviewed; Rxs approved 

## 2016-09-24 ENCOUNTER — Other Ambulatory Visit: Payer: Self-pay

## 2016-09-24 DIAGNOSIS — N6012 Diffuse cystic mastopathy of left breast: Secondary | ICD-10-CM

## 2016-09-25 ENCOUNTER — Ambulatory Visit: Payer: Medicaid Other | Admitting: Family Medicine

## 2016-10-04 ENCOUNTER — Encounter: Payer: Self-pay | Admitting: Family Medicine

## 2016-10-04 ENCOUNTER — Ambulatory Visit (INDEPENDENT_AMBULATORY_CARE_PROVIDER_SITE_OTHER): Payer: Medicaid Other | Admitting: Family Medicine

## 2016-10-04 VITALS — BP 126/82 | HR 90 | Temp 97.5°F | Resp 14 | Wt 171.6 lb

## 2016-10-04 DIAGNOSIS — I10 Essential (primary) hypertension: Secondary | ICD-10-CM | POA: Diagnosis not present

## 2016-10-04 DIAGNOSIS — Z5181 Encounter for therapeutic drug level monitoring: Secondary | ICD-10-CM | POA: Diagnosis not present

## 2016-10-04 DIAGNOSIS — R0683 Snoring: Secondary | ICD-10-CM

## 2016-10-04 DIAGNOSIS — G478 Other sleep disorders: Secondary | ICD-10-CM

## 2016-10-04 DIAGNOSIS — E559 Vitamin D deficiency, unspecified: Secondary | ICD-10-CM | POA: Insufficient documentation

## 2016-10-04 DIAGNOSIS — L2089 Other atopic dermatitis: Secondary | ICD-10-CM

## 2016-10-04 DIAGNOSIS — E785 Hyperlipidemia, unspecified: Secondary | ICD-10-CM | POA: Diagnosis not present

## 2016-10-04 DIAGNOSIS — E1065 Type 1 diabetes mellitus with hyperglycemia: Secondary | ICD-10-CM | POA: Diagnosis not present

## 2016-10-04 DIAGNOSIS — Z720 Tobacco use: Secondary | ICD-10-CM

## 2016-10-04 DIAGNOSIS — IMO0002 Reserved for concepts with insufficient information to code with codable children: Secondary | ICD-10-CM

## 2016-10-04 DIAGNOSIS — E1365 Other specified diabetes mellitus with hyperglycemia: Secondary | ICD-10-CM

## 2016-10-04 LAB — CBC WITH DIFFERENTIAL/PLATELET
Basophils Absolute: 0 cells/uL (ref 0–200)
Basophils Relative: 0 %
EOS PCT: 3 %
Eosinophils Absolute: 195 cells/uL (ref 15–500)
HCT: 38.2 % (ref 35.0–45.0)
HEMOGLOBIN: 12.6 g/dL (ref 11.7–15.5)
LYMPHS ABS: 1495 {cells}/uL (ref 850–3900)
Lymphocytes Relative: 23 %
MCH: 28.8 pg (ref 27.0–33.0)
MCHC: 33 g/dL (ref 32.0–36.0)
MCV: 87.4 fL (ref 80.0–100.0)
MPV: 10.3 fL (ref 7.5–12.5)
Monocytes Absolute: 325 cells/uL (ref 200–950)
Monocytes Relative: 5 %
NEUTROS ABS: 4485 {cells}/uL (ref 1500–7800)
NEUTROS PCT: 69 %
PLATELETS: 257 10*3/uL (ref 140–400)
RBC: 4.37 MIL/uL (ref 3.80–5.10)
RDW: 14.4 % (ref 11.0–15.0)
WBC: 6.5 10*3/uL (ref 3.8–10.8)

## 2016-10-04 LAB — LIPID PANEL
Cholesterol: 286 mg/dL — ABNORMAL HIGH (ref <200)
HDL: 38 mg/dL — ABNORMAL LOW (ref >50)
LDL CALC: 172 mg/dL — AB (ref <100)
Total CHOL/HDL Ratio: 7.5 Ratio — ABNORMAL HIGH (ref <5.0)
Triglycerides: 380 mg/dL — ABNORMAL HIGH (ref <150)
VLDL: 76 mg/dL — ABNORMAL HIGH (ref <30)

## 2016-10-04 LAB — COMPLETE METABOLIC PANEL WITH GFR
ALT: 18 U/L (ref 6–29)
AST: 10 U/L (ref 10–35)
Albumin: 4 g/dL (ref 3.6–5.1)
Alkaline Phosphatase: 129 U/L (ref 33–130)
BUN: 15 mg/dL (ref 7–25)
CHLORIDE: 103 mmol/L (ref 98–110)
CO2: 25 mmol/L (ref 20–31)
CREATININE: 0.93 mg/dL (ref 0.50–1.05)
Calcium: 9.9 mg/dL (ref 8.6–10.4)
GFR, EST AFRICAN AMERICAN: 80 mL/min (ref >=60)
GFR, EST NON AFRICAN AMERICAN: 69 mL/min (ref >=60)
Glucose, Bld: 475 mg/dL — ABNORMAL HIGH (ref 65–99)
POTASSIUM: 4.7 mmol/L (ref 3.5–5.3)
Sodium: 134 mmol/L — ABNORMAL LOW (ref 135–146)
Total Bilirubin: 0.3 mg/dL (ref 0.2–1.2)
Total Protein: 7 g/dL (ref 6.1–8.1)

## 2016-10-04 LAB — HEMOGLOBIN A1C: Hgb A1c MFr Bld: >14 % — ABNORMAL HIGH (ref <5.7)

## 2016-10-04 MED ORDER — FUTURO LEFT HAND WRIST BRACE MISC: 0 refills | Status: AC

## 2016-10-04 NOTE — Assessment & Plan Note (Signed)
Watch kidneys and liver 

## 2016-10-04 NOTE — Assessment & Plan Note (Signed)
Using cream

## 2016-10-04 NOTE — Assessment & Plan Note (Signed)
Well controlled 

## 2016-10-04 NOTE — Assessment & Plan Note (Signed)
Check level and probably do the prescription again

## 2016-10-04 NOTE — Assessment & Plan Note (Signed)
Refer back to endocrinologist; continue meds; check A1c; foot exam by MD today; try to avoid soda and sugary drinks

## 2016-10-04 NOTE — Patient Instructions (Signed)
We'll have you see the specialist about your diabetes We'll get you to see someone about your sleep We'll get labs today If you have not heard anything from my staff in a week about any orders/referrals/studies from today, please contact us here to follow-up [phone] Keep up the great job cutting back on your cigarettes; I know one day you'll stop completely!

## 2016-10-04 NOTE — Assessment & Plan Note (Signed)
On statin; tries to limit saturated fats; check lipid

## 2016-10-04 NOTE — Progress Notes (Signed)
BP 126/82   Pulse 90   Temp 97.5 F (36.4 C) (Oral)   Resp 14   Wt 171 lb 9.6 oz (77.8 kg)   LMP 04/29/2015 (Approximate)   SpO2 95%   BMI 25.34 kg/m    Subjective:    Patient ID: Allison Cooke, female    DOB: [birth year], 56 y.o.   MRN: [id number]  HPI: Allison Cooke is a 56 y.o. female  Chief Complaint  Patient presents with  . Follow-up   Patient is here for follow-up of multiple things  Left hand carpal tunnel; several months; getting worse; hand crooks up; shakes it or massages it; could not get the brace, pharmacy did not have Rx; right-handed  Diabetes; doing just "okay"; no low sugars; highest sugar in the last two weeks was 500; not specialist; never get into seeing the specialist; she's willing to go to see specialist if we can make the appointment; last eye exa a year ago; needs some glasses  They told her she needs a sleep study; not resting well; not waking up refreshed; wakes up every hour; daughter has sleep problems too; she snores  Vitamin D has been low in the past; had been on prescription vitamin D and thinks that might help; feels tired; skin okay right now, but will probably get worse with hot weather  BP is excellent; on medicine  She still smokes, but has cut down a lot; now just smoking 2 to 3 cigarettes a day  She is seeing psychiatrist for her schizophrenia and depression and PTSD; still hearing voices sometimes  Depression screen [facility] 2/9 10/04/2016 06/25/2016 05/08/2016 02/03/2016 01/04/2016  Decreased Interest 0 1 0 1 1  Down, Depressed, Hopeless 1 1 1 3 3   PHQ - 2 Score 1 2 1 4 4   Altered sleeping - 1 - 3 0  Tired, decreased energy - 1 - 3 1  Change in appetite - 1 - 1 3  Feeling bad or failure about yourself  - 1 - 3 3  Trouble concentrating - 1 - 3 3  Moving slowly or fidgety/restless - 1 - 3 3  Suicidal thoughts - 1 - 2 1  PHQ-9 Score - 9 - 22 18  Difficult doing work/chores - Not difficult at all - Extremely dIfficult Very  difficult   Relevant past medical, surgical, family and social history reviewed Past Medical History:  Diagnosis Date  . Anemia   . Arthritis   . Chronic post-traumatic stress disorder (PTSD) 01/29/2016  . Diabetes mellitus without complication ([place])   . Diabetes, type 1.5, uncontrolled, managed as type 1 ([place]) 06/28/2015  . Headache   . Heart murmur   . Hypertension   . Lumbar back pain 02/03/2016  . Seizures ([place])    as a child  . SOB (shortness of breath)    Past Surgical History:  Procedure Laterality Date  . BREAST LUMPECTOMY Left 07/14/2015   Procedure: BREAST LUMPECTOMY;  Surgeon: Christene Lye, MD;  Location: [facility] ORS;  Service: General;  Laterality: Left;  . BREAST SURGERY Left   . CESAREAN SECTION    . CHOLECYSTECTOMY    . COLONOSCOPY WITH PROPOFOL N/A 03/06/2016   Procedure: COLONOSCOPY WITH PROPOFOL;  Surgeon: [name], MD;  Location: [facility] ENDOSCOPY;  Service: Endoscopy;  Laterality: N/A;  . DIRECT LARYNGOSCOPY N/A 04/26/2015   Procedure: Microlaryngoscopy with exxcision of vocal cord polyp ;  Surgeon: Beverly Gust, MD;  Location: [facility] ORS;  Service: ENT;  Laterality: N/A;  .  FINE NEEDLE ASPIRATION Left    breast cyst, benign, age 44?   Family History  Problem Relation Age of Onset  . Hypertension Mother   . Heart disease Mother   . Hyperlipidemia Mother   . Diabetes Mother   . Heart disease Father   . Hyperlipidemia Father   . Hypertension Father   . Diabetes Sister   . Hypertension Daughter   . Diabetes Maternal Grandmother   . Diabetes Sister   . Breast cancer Cousin   . Cancer Neg Hx   . Stroke Neg Hx   . COPD Neg Hx    Social History  Substance Use Topics  . Smoking status: Current Some Day Smoker    Packs/day: 0.25    Years: 20.00    Types: Cigarettes  . Smokeless tobacco: Never Used  . Alcohol use No  MD note: only smoking 1/10 of a pack per day  Interim medical history since last visit reviewed. Allergies and medications  reviewed  Review of Systems Per HPI unless specifically indicated above     Objective:    BP 126/82   Pulse 90   Temp 97.5 F (36.4 C) (Oral)   Resp 14   Wt 171 lb 9.6 oz (77.8 kg)   LMP 04/29/2015 (Approximate)   SpO2 95%   BMI 25.34 kg/m   Wt Readings from Last 3 Encounters:  10/04/16 171 lb 9.6 oz (77.8 kg)  06/25/16 183 lb 7 oz (83.2 kg)  05/08/16 187 lb (84.8 kg)    Physical Exam  Constitutional: She appears well-developed and well-nourished. No distress.  HENT:  Head: Normocephalic and atraumatic.  Eyes: EOM are normal. No scleral icterus.  Neck: No thyromegaly present.  Cardiovascular: Normal rate, regular rhythm and normal heart sounds.   No murmur heard. Pulmonary/Chest: Effort normal and breath sounds normal. No respiratory distress. She has no wheezes.  Abdominal: Soft. Bowel sounds are normal. She exhibits no distension.  Musculoskeletal: Normal range of motion. She exhibits no edema.       Left hand: She exhibits normal range of motion, no bony tenderness and no deformity. Normal sensation noted.  Neurological: She is alert. She displays no tremor. She exhibits normal muscle tone.  positive Tinel's on the left; positive Phalen's on the left  Skin: Skin is warm and dry. No rash noted. She is not diaphoretic. No pallor.  Psychiatric: Judgment and thought content normal. Her mood appears not anxious. Her affect is blunt. Her speech is not delayed. She is not slowed and not withdrawn. Cognition and memory are not impaired. She does not express impulsivity or inappropriate judgment. She does not exhibit a depressed mood. She expresses no homicidal and no suicidal ideation.  Good eye contact with examiner She is attentive.   Results for orders placed or performed in visit on 06/25/16  ALT  Result Value Ref Range   ALT 23 6 - 29 U/L  Lipid panel  Result Value Ref Range   Cholesterol 161 <200 mg/dL   Triglycerides 107 <150 mg/dL   HDL 50 (L) >50 mg/dL   Total  CHOL/HDL Ratio 3.2 <5.0 Ratio   VLDL 21 <30 mg/dL   LDL Cholesterol 90 <100 mg/dL      Assessment & Plan:   Problem List Items Addressed This Visit      Cardiovascular and Mediastinum   Essential hypertension, benign    Well-controlled        Endocrine   Diabetes, type 1.5, uncontrolled, managed as type 1 (  [place]) - Primary    Refer back to endocrinologist; continue meds; check A1c; foot exam by MD today; try to avoid soda and sugary drinks      Relevant Orders   Ambulatory referral to Endocrinology   Hemoglobin A1c     Musculoskeletal and Integument   Atopic dermatitis    Using cream        Other   Vitamin D deficiency    Check level and probably do the prescription again      Relevant Orders   VITAMIN D 25 Hydroxy (Vit-D Deficiency, Fractures)   Tobacco abuse    Encouraged her to quit; glad she is cutting back; I am here to help if when needed      Medication monitoring encounter    Watch kidneys and liver      Relevant Orders   CBC with Differential/Platelet   COMPLETE METABOLIC PANEL WITH GFR   Hyperlipidemia LDL goal <100    On statin; tries to limit saturated fats; check lipid      Relevant Orders   Lipid panel    Other Visit Diagnoses    Snoring       Relevant Orders   Ambulatory referral to ENT   Unrefreshed by sleep           Follow up plan: Return in about 3 months (around 01/04/2017) for fasting labs and visit.  An after-visit summary was printed and given to the patient at [facility].  Please see the patient instructions which may contain other information and recommendations beyond what is mentioned above in the assessment and plan.  Meds ordered this encounter  Medications  . Elastic Bandages & Supports (F[place]UR[place] LEFT HAND WRIST BRACE) MISC    Sig: Wear as often as possible for one month for carpal tunnel syndrome    Dispense:  1 each    Refill:  0    Orders Placed This Encounter  Procedures  . Lipid panel  . CBC with  Differential/Platelet  . Hemoglobin A1c  . COMPLETE METABOLIC PANEL WITH GFR  . VITAMIN D 25 Hydroxy (Vit-D Deficiency, Fractures)  . Ambulatory referral to Endocrinology  . Ambulatory referral to ENT

## 2016-10-04 NOTE — Assessment & Plan Note (Signed)
Encouraged her to quit; glad she is cutting back; I am here to help if when needed

## 2016-10-05 ENCOUNTER — Telehealth: Payer: Self-pay

## 2016-10-05 LAB — VITAMIN D 25 HYDROXY (VIT D DEFICIENCY, FRACTURES): Vit D, 25-Hydroxy: 17 ng/mL — ABNORMAL LOW (ref 30–100)

## 2016-10-05 MED ORDER — VITAMIN D (ERGOCALCIFEROL) 1.25 MG (50000 UNIT) PO CAPS
50000.0000 [IU] | ORAL_CAPSULE | ORAL | 1 refills | Status: DC
Start: 2016-10-05 — End: 2016-12-11

## 2016-10-05 MED ORDER — ATORVASTATIN CALCIUM 80 MG PO TABS: 80.0000 mg | ORAL_TABLET | Freq: Every day | ORAL | 5 refills | Status: DC

## 2016-10-05 MED ORDER — LINAGLIPTIN 5 MG PO TABS: 5.0000 mg | ORAL_TABLET | Freq: Every day | ORAL | 11 refills | Status: DC

## 2016-10-05 NOTE — Telephone Encounter (Signed)
I called patient to go over her lab results; reached voicemail Endocrinologist's office wrote back and explained she had no showed for 3 visits  We have that she's taking 40 units of the levemir once a day Go to 45 units once a day  We have that she's taking 9 units of the short-acting Go to 11 units of short-acting with every meal I left detailed message with insulin instructions; will try to reach her later Add Tradjenta to regimen as well

## 2016-10-05 NOTE — Addendum Note (Signed)
Addended by: LADA, Satira Anis on: 10/05/2016 05:48 PM   Modules accepted: Orders

## 2016-10-05 NOTE — Telephone Encounter (Signed)
Called to alert [place] that the pt glucose reading is 475. Results for the glucose has been repeated and verified.

## 2016-10-05 NOTE — Addendum Note (Signed)
Addended by: Lalania Haseman, Satira Anis on: 10/05/2016 05:27 PM   Modules accepted: Orders

## 2016-10-11 ENCOUNTER — Encounter: Payer: Self-pay | Admitting: Family Medicine

## 2016-10-11 NOTE — Telephone Encounter (Signed)
I tried and have been unable to reach her Will send letter

## 2016-11-13 ENCOUNTER — Other Ambulatory Visit: Payer: Medicaid Other

## 2016-11-13 ENCOUNTER — Inpatient Hospital Stay: Admission: RE | Admit: 2016-11-13 | Payer: Medicaid Other | Source: Ambulatory Visit

## 2016-11-19 ENCOUNTER — Ambulatory Visit: Payer: Medicaid Other | Admitting: General Surgery

## 2016-11-21 LAB — HM DIABETES EYE EXAM

## 2016-11-24 IMAGING — CR DG CHEST 2V
1 series · 2 of 2 positions shown · non-contrast
Comparison: 06/08/2009

CLINICAL DATA: Pt blacked out and fell last night. Htn, sob, heart
murmer. Smoker.

EXAM:
CHEST  2 VIEW

[Series 1: x chest ap · 0.14mm/px · 2 of 2 slices shown]
[im 1/2]
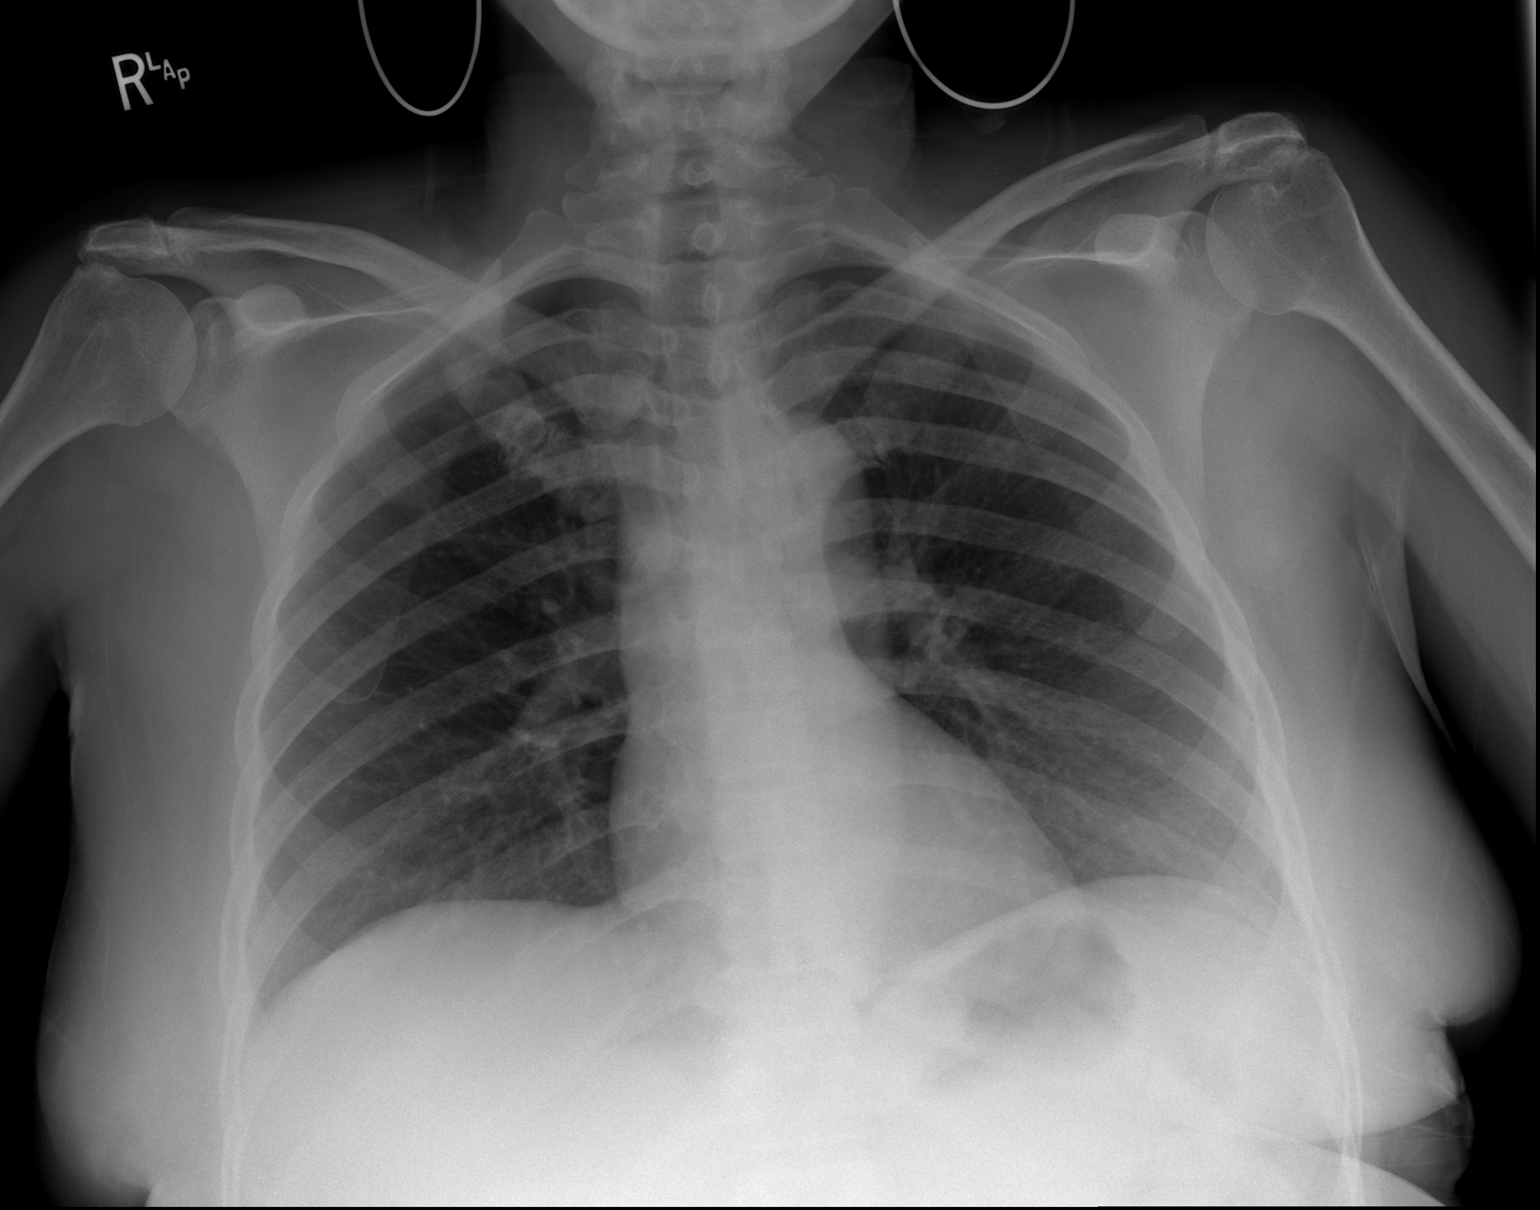
[im 2/2]
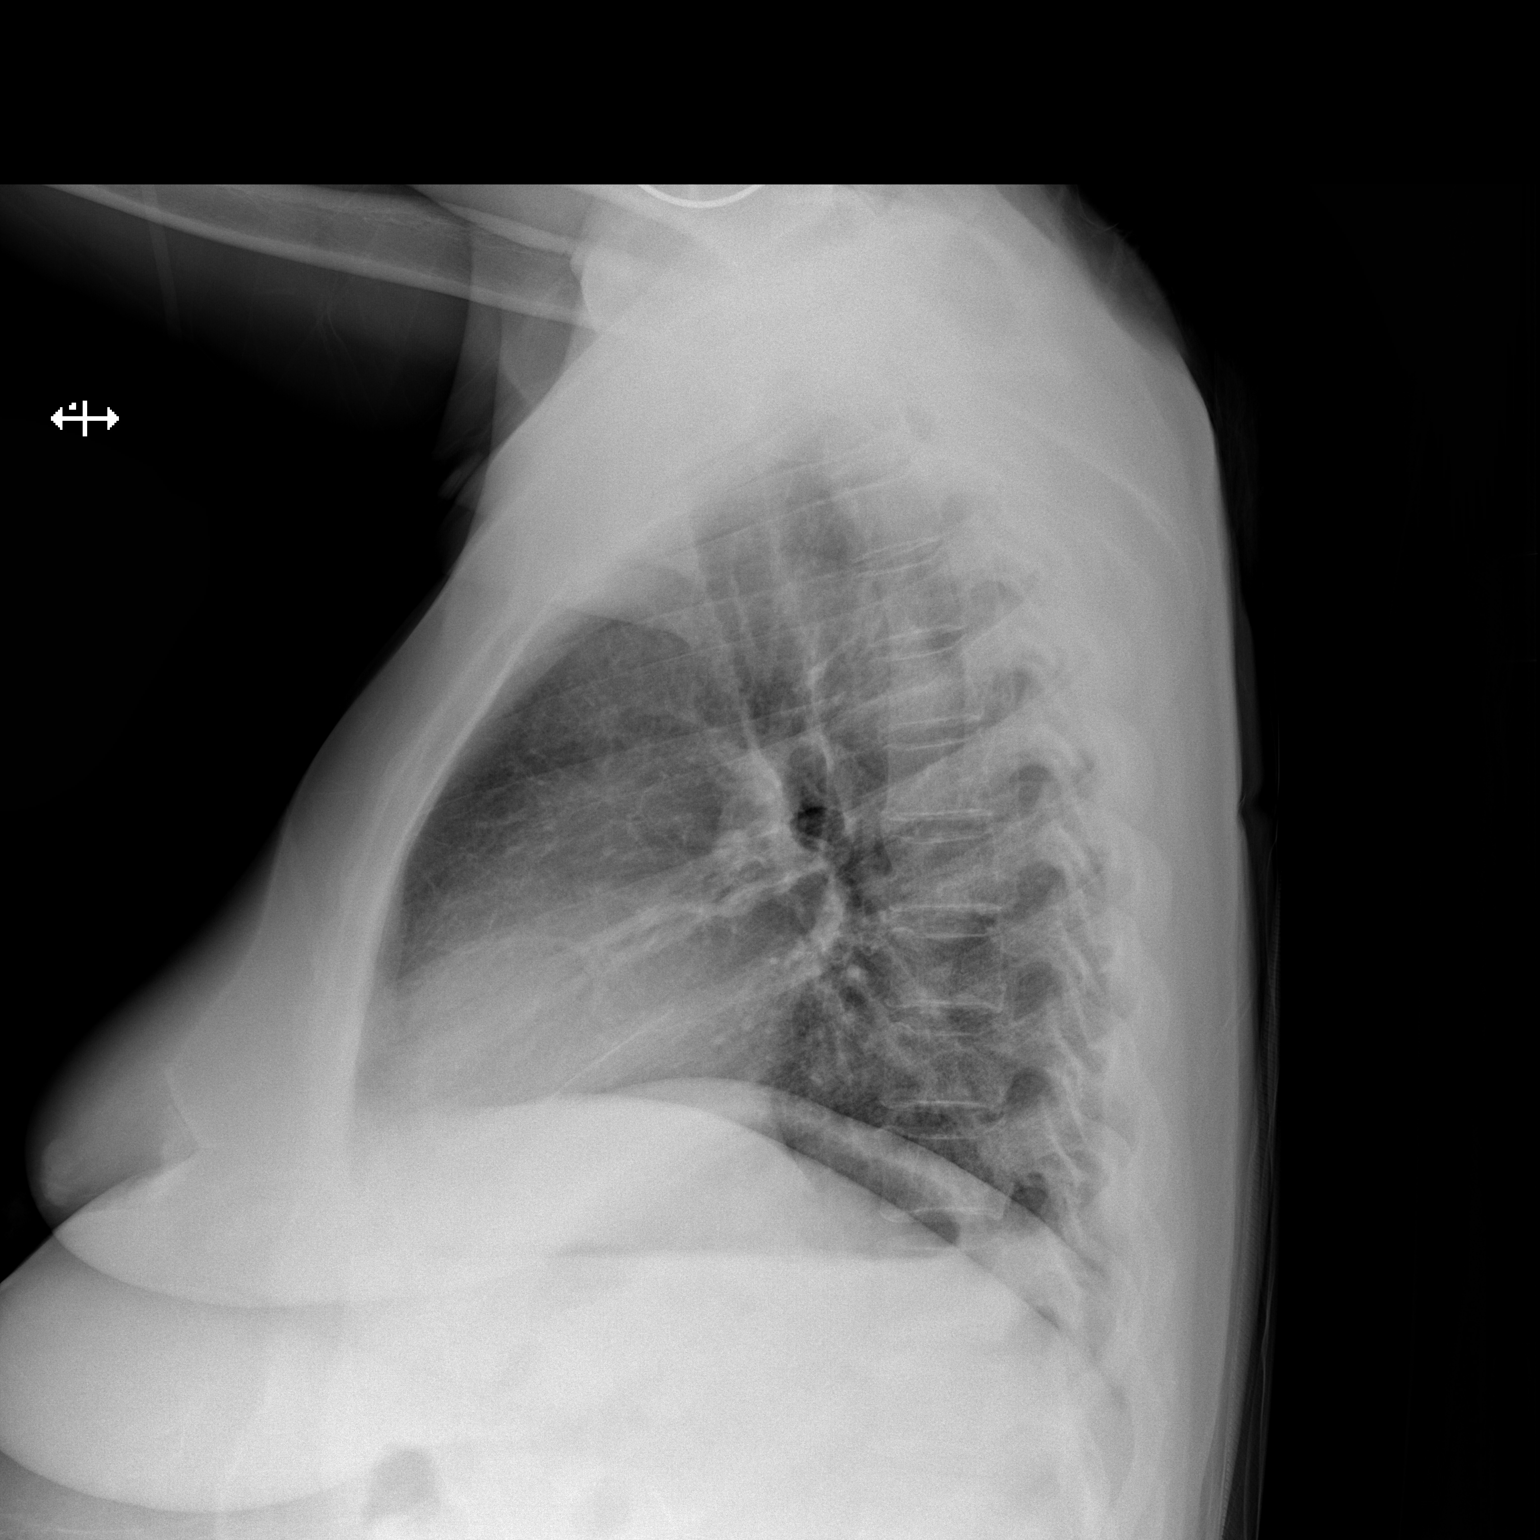

[2 of 2 positions shown; findings below may reference images not displayed]

FINDINGS: The heart size and mediastinal contours are within normal limits.
Both lungs are clear. No pleural effusion or pneumothorax. The
visualized skeletal structures are unremarkable.
IMPRESSION: No active cardiopulmonary disease.

## 2016-11-24 IMAGING — CR DG KNEE COMPLETE 4+V*R*
1 series · 4 of 4 positions shown · non-contrast
Comparison: None.

CLINICAL DATA: Pt fell this morning. Hit the ground hard with her
right knee. Pain with movement. States unable to apply
pressure/stand up on it.

EXAM:
RIGHT KNEE - COMPLETE 4+ VIEW

[Series 1: ap · 0.17mm/px · 4 of 4 slices shown]
[im 1/4]
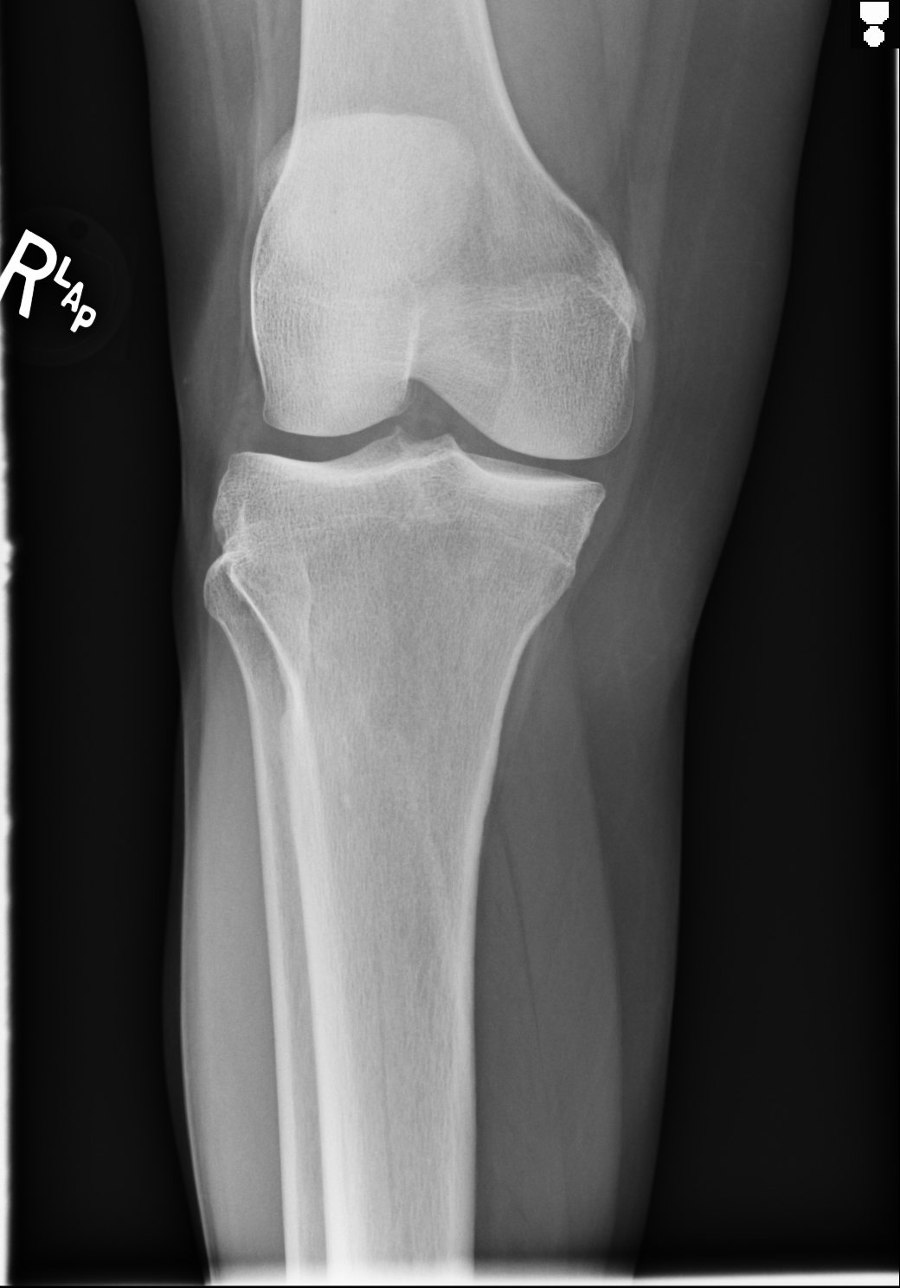
[im 2/4]
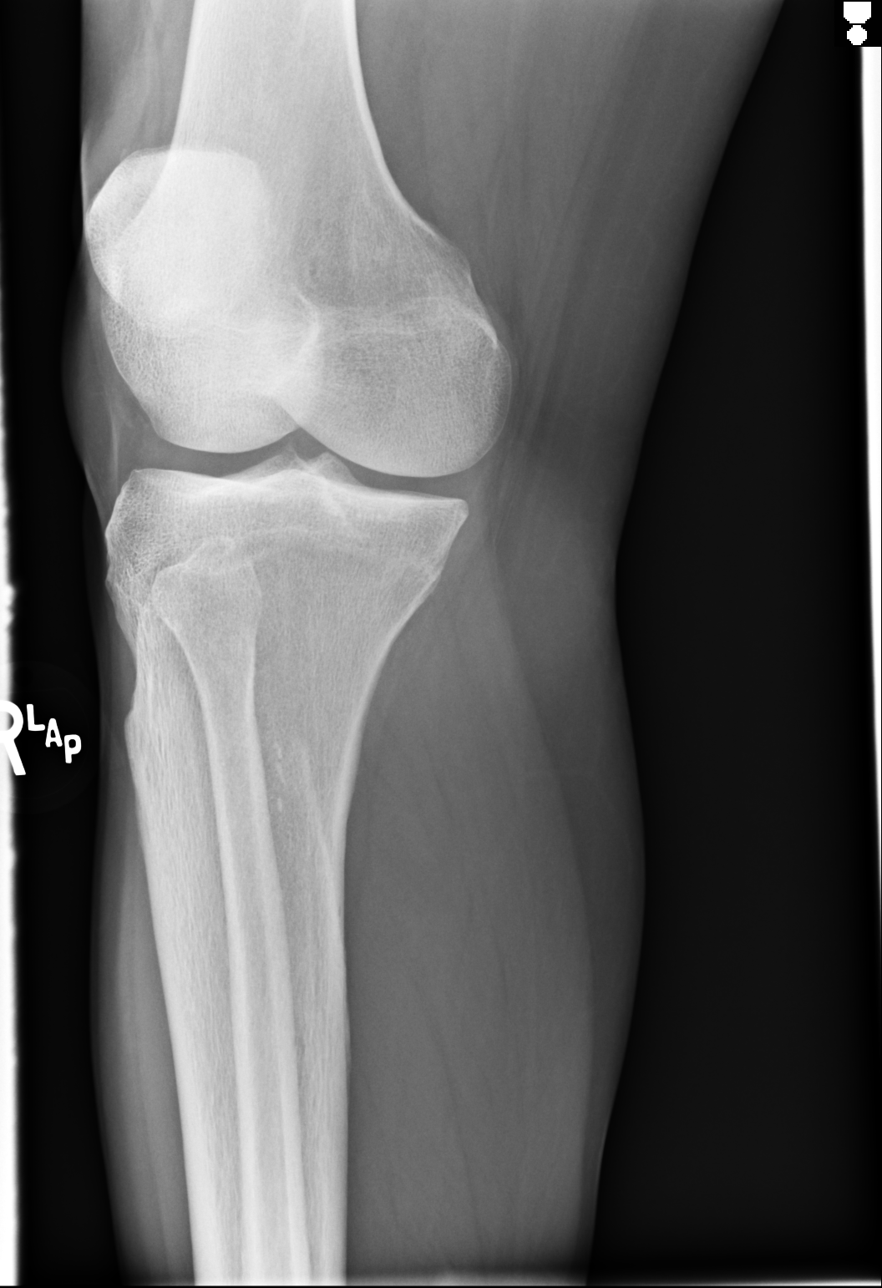
[im 3/4]
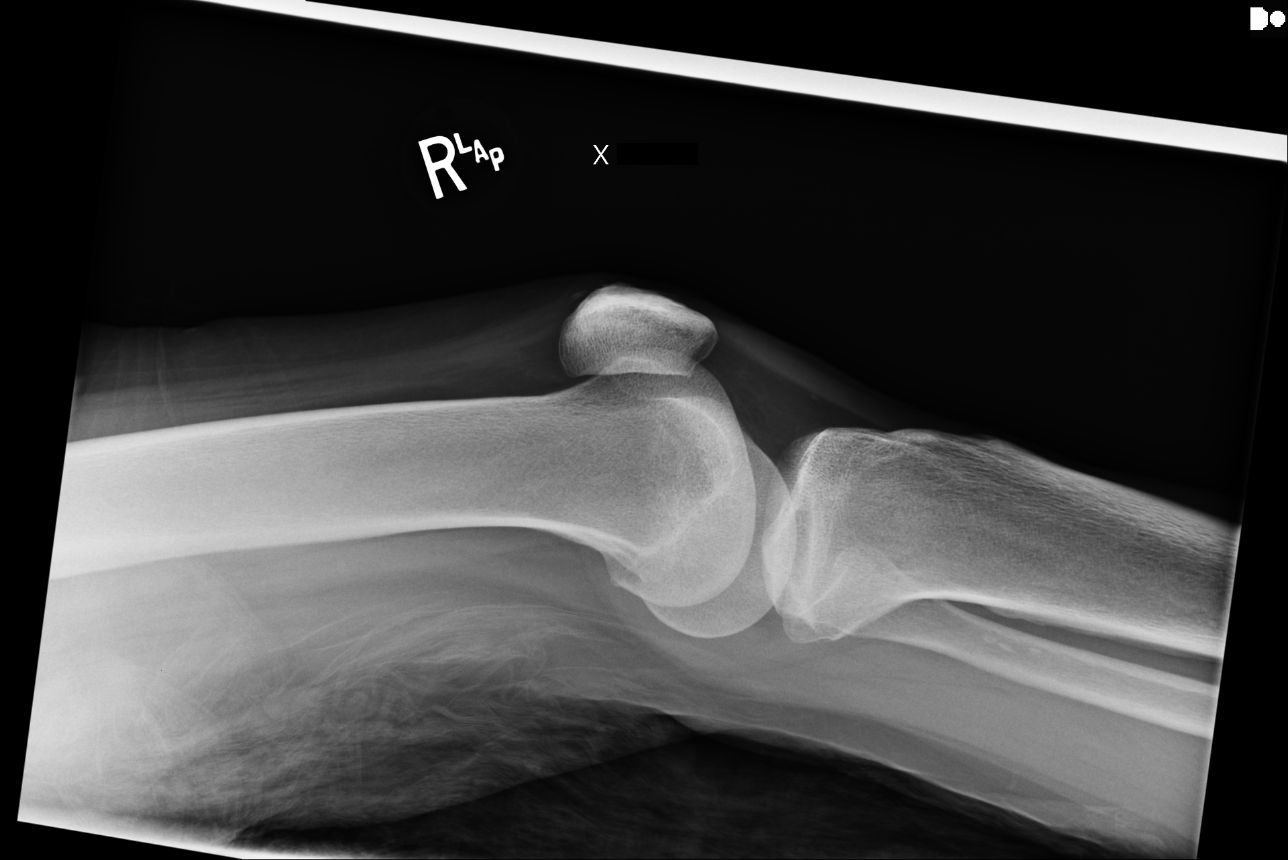
[im 4/4]
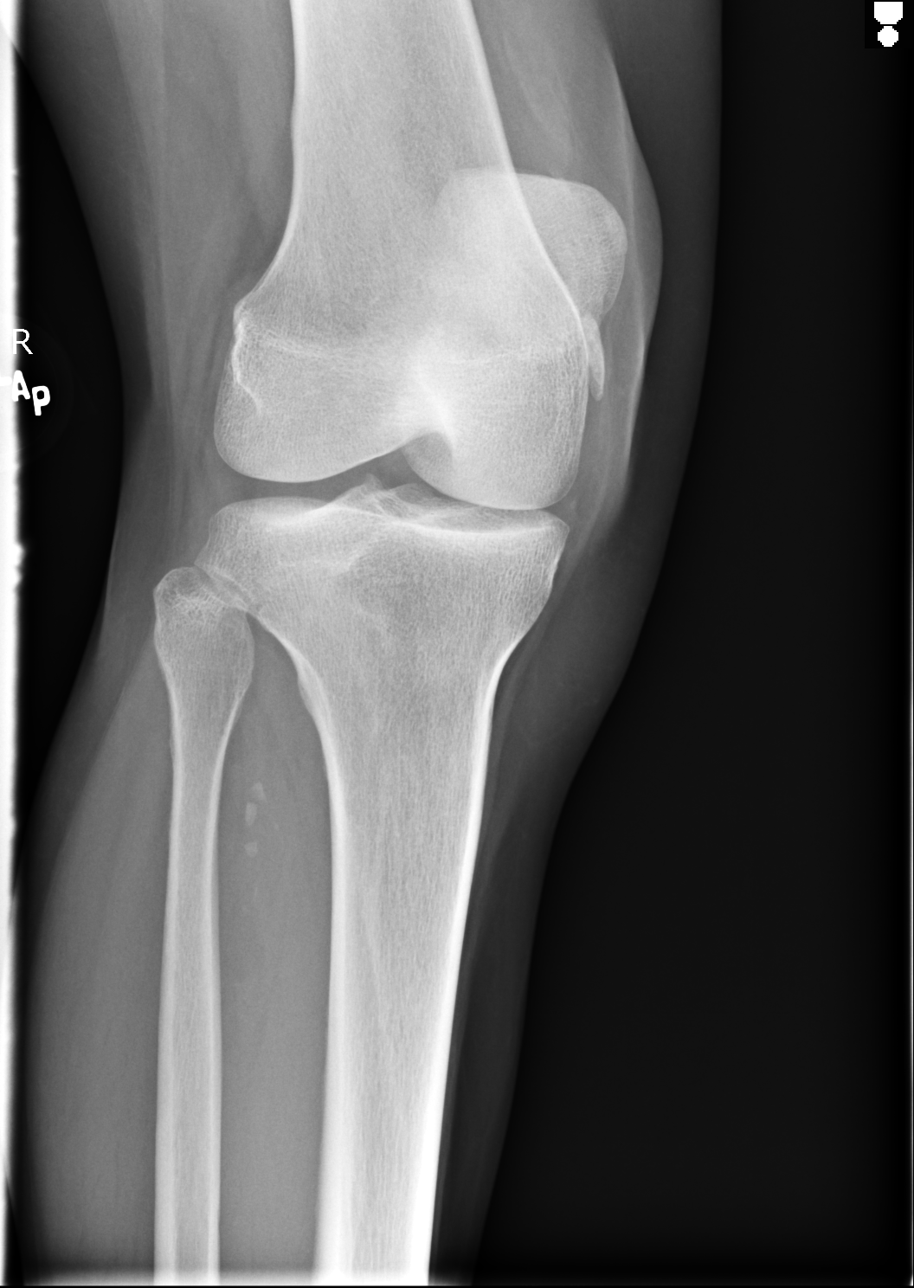

[4 of 4 positions shown; findings below may reference images not displayed]

FINDINGS: No convincing acute fracture. There is a well corticated bone
fragment along the medial margin of the medial femoral condyle. This
has a chronic appearance with no associated soft tissue swelling or
joint effusion. It may reflect an accessory ossification center.

Knee joint is normally spaced and aligned. No joint effusion. No
arthropathic change.
IMPRESSION: No acute fracture or dislocation.  No joint effusion.

## 2016-11-30 ENCOUNTER — Ambulatory Visit
Admission: RE | Admit: 2016-11-30 | Discharge: 2016-11-30 | Disposition: A | Discharge status: Home / Self Care | Payer: Medicaid Other | Source: Ambulatory Visit | Attending: General Surgery | Admitting: General Surgery

## 2016-11-30 DIAGNOSIS — N6012 Diffuse cystic mastopathy of left breast: Secondary | ICD-10-CM | POA: Diagnosis present

## 2016-12-10 ENCOUNTER — Other Ambulatory Visit: Payer: Self-pay | Admitting: Family Medicine

## 2016-12-11 MED ORDER — VITAMIN D (ERGOCALCIFEROL) 1.25 MG (50000 UNIT) PO CAPS: 50000.0000 [IU] | ORAL_CAPSULE | ORAL | 5 refills | Status: DC

## 2016-12-11 NOTE — Telephone Encounter (Signed)
Patient is seeing endo now for diabetes Denied the plain metformin Rx request; Dr. Joycie Peek note says to stop plain metformin and we'll have all diabetes meds go through endo I am changing vit D Rx to just once a month

## 2016-12-12 ENCOUNTER — Encounter: Payer: Self-pay | Admitting: *Deleted

## 2016-12-13 ENCOUNTER — Other Ambulatory Visit: Payer: Self-pay | Admitting: Family Medicine

## 2016-12-13 NOTE — Telephone Encounter (Signed)
She's seeing Dr. Gabriel Carina now, so we'll have all diabetes medicines and supplies go through her endocrinologist

## 2016-12-14 NOTE — Telephone Encounter (Signed)
Patient notified

## 2016-12-27 ENCOUNTER — Ambulatory Visit: Payer: Medicaid Other | Admitting: General Surgery

## 2016-12-30 ENCOUNTER — Encounter: Payer: Self-pay | Admitting: Emergency Medicine

## 2016-12-30 ENCOUNTER — Emergency Department
Admission: EM | Admit: 2016-12-30 | Discharge: 2016-12-30 | Disposition: A | Discharge status: Home / Self Care | Payer: Medicaid Other | Attending: Emergency Medicine | Admitting: Emergency Medicine

## 2016-12-30 DIAGNOSIS — I10 Essential (primary) hypertension: Secondary | ICD-10-CM | POA: Diagnosis not present

## 2016-12-30 DIAGNOSIS — F17210 Nicotine dependence, cigarettes, uncomplicated: Secondary | ICD-10-CM | POA: Insufficient documentation

## 2016-12-30 DIAGNOSIS — Z7984 Long term (current) use of oral hypoglycemic drugs: Secondary | ICD-10-CM | POA: Diagnosis not present

## 2016-12-30 DIAGNOSIS — Z79899 Other long term (current) drug therapy: Secondary | ICD-10-CM | POA: Insufficient documentation

## 2016-12-30 DIAGNOSIS — Z794 Long term (current) use of insulin: Secondary | ICD-10-CM | POA: Insufficient documentation

## 2016-12-30 DIAGNOSIS — R310 Gross hematuria: Secondary | ICD-10-CM | POA: Diagnosis not present

## 2016-12-30 DIAGNOSIS — E1165 Type 2 diabetes mellitus with hyperglycemia: Secondary | ICD-10-CM | POA: Diagnosis not present

## 2016-12-30 DIAGNOSIS — R319 Hematuria, unspecified: Secondary | ICD-10-CM | POA: Diagnosis present

## 2016-12-30 HISTORY — DX: Depression, unspecified: F32.A

## 2016-12-30 HISTORY — DX: Pure hypercholesterolemia, unspecified: E78.00

## 2016-12-30 HISTORY — DX: Major depressive disorder, single episode, unspecified: F32.9

## 2016-12-30 LAB — URINALYSIS, COMPLETE (UACMP) WITH MICROSCOPIC
Bilirubin Urine: NEGATIVE
Glucose, UA: >=500 mg/dL — AB
KETONES UR: NEGATIVE mg/dL
Leukocytes, UA: NEGATIVE
Nitrite: NEGATIVE
PROTEIN: NEGATIVE mg/dL
Specific Gravity, Urine: 1.022 (ref 1.005–1.030)
pH: 5 (ref 5.0–8.0)

## 2016-12-30 LAB — GLUCOSE, CAPILLARY: GLUCOSE-CAPILLARY: 393 mg/dL — AB (ref 65–99)

## 2016-12-30 NOTE — ED Notes (Signed)
Pt alert and oriented X4, active, cooperative, pt in NAD. RR even and unlabored, color WNL.  Pt informed to return if any life threatening symptoms occur.   

## 2016-12-30 NOTE — ED Notes (Signed)
Pt saw scant amount of bright red blood when she wiped today. Denies vaginal or rectal bleeding. No vaginal sx reported. Pt alert and oriented X4, active, cooperative, pt in NAD. RR even and unlabored, color WNL.

## 2016-12-30 NOTE — ED Provider Notes (Signed)
[facility] Emergency Department Provider Note  ____________________________________________   First MD Initiated Contact with Patient 12/30/16 [phone]     (approximate)  I have reviewed the triage vital signs and the nursing notes.   HISTORY  Chief Complaint Hematuria    HPI Allison Cooke is a 56 y.o. female is here with complaint of seeing a small amount of blood on toilet tissue when she was wiping after voiding yesterday. Patient denies any pain, urgency or frequency. She is unaware of any urinary tract infections in the past. She denies any vaginal pain or discharge. No history of kidney stones. Patient has no other complaints. Patient rates her pain as a 0/10 at this time.   Past Medical History:  Diagnosis Date  . Anemia   . Arthritis   . Chronic post-traumatic stress disorder (PTSD) 01/29/2016  . Depression   . Diabetes mellitus without complication ([place])   . Diabetes, type 1.5, uncontrolled, managed as type 1 ([place]) 06/28/2015  . Headache   . Heart murmur   . High cholesterol   . Hypertension   . Lumbar back pain 02/03/2016  . Seizures ([place])    as a child  . SOB (shortness of breath)     Patient Active Problem List   Diagnosis Date Noted  . Vitamin D deficiency 10/04/2016  . Carpal tunnel syndrome, bilateral 06/25/2016  . Chronic pain of right ankle 06/25/2016  . Hyperlipidemia LDL goal <100 06/25/2016  . Fatigue 05/08/2016  . Atopic dermatitis 05/08/2016  . Special screening for malignant neoplasms, colon   . Benign neoplasm of sigmoid colon   . Benign neoplasm of descending colon   . Rectal polyp   . Lumbar back pain 02/03/2016  . Chronic post-traumatic stress disorder (PTSD) 01/29/2016  . Right anterior knee pain 08/03/2015  . Diabetes, type 1.5, uncontrolled, managed as type 1 ([place]) 06/28/2015  . Abnormal EKG 04/22/2015  . Dysphagia 03/30/2015  . Medication monitoring encounter 03/30/2015  . Essential hypertension, benign  03/02/2015  . Depression, major, recurrent ([place]) 03/02/2015  . Tobacco abuse 03/02/2015  . Chronic dermatitis 03/02/2015  . Hoarseness of voice 03/02/2015    Past Surgical History:  Procedure Laterality Date  . BREAST EXCISIONAL BIOPSY Left 2016   NEG  . BREAST LUMPECTOMY Left 07/14/2015   Procedure: BREAST LUMPECTOMY;  Surgeon: Christene Lye, MD;  Location: [facility] ORS;  Service: General;  Laterality: Left;  . BREAST SURGERY Left   . CESAREAN SECTION    . CHOLECYSTECTOMY    . COLONOSCOPY WITH PROPOFOL N/A 03/06/2016   Procedure: COLONOSCOPY WITH PROPOFOL;  Surgeon: [name], MD;  Location: [facility] ENDOSCOPY;  Service: Endoscopy;  Laterality: N/A;  . DIRECT LARYNGOSCOPY N/A 04/26/2015   Procedure: Microlaryngoscopy with exxcision of vocal cord polyp ;  Surgeon: Beverly Gust, MD;  Location: [facility] ORS;  Service: ENT;  Laterality: N/A;  . FINE NEEDLE ASPIRATION Left    breast cyst, benign, age 17?    Prior to Admission medications   Medication Sig Start Date End Date Taking? Authorizing Provider  amLODipine (NORVASC) 5 MG tablet TAKE 1 TABLET BY MOUTH EVERY MORNING 09/03/16   Lada, Satira Anis, MD  atorvastatin (LIPITOR) 80 MG tablet Take 1 tablet (80 mg total) by mouth daily. For cholesterol 10/05/16   Lada, Satira Anis, MD  Brexpiprazole (REXULTI) 4 MG TABS Take 4 mg by mouth daily.    [provider]  Crisaborole (EUCRISA) 2 % OINT Apply 1 each topically 2 (two) times  daily. 05/08/16   Arnetha Courser, MD  Elastic Bandages & Supports ([place] LEFT HAND WRIST BRACE) MISC Wear as often as possible for one month for carpal tunnel syndrome 10/04/16   Arnetha Courser, MD  glucose blood (ACCU-CHEK AVIVA) test strip Check fingerstick blood sugars three times a day; E11.65, LON 99 months; insulin-dependent; Accu-check Aviva Plus strips 02/10/16   Gladstone Lighter, MD  insulin aspart (NOVOLOG) 100 UNIT/ML FlexPen Inject 9 Units into the skin 3 (three) times daily with meals. 02/10/16    Gladstone Lighter, MD  Insulin Detemir (LEVEMIR) 100 UNIT/ML Pen Inject 40 Units into the skin every morning. 06/25/16   Arnetha Courser, MD  Lancets (ONETOUCH ULTRASOFT) lancets Use as instructed 02/10/16   Gladstone Lighter, MD  linagliptin (TRADJENTA) 5 MG TABS tablet Take 1 tablet (5 mg total) by mouth daily. 10/05/16   Arnetha Courser, MD  lisinopril (PRINIVIL,ZESTRIL) 5 MG tablet Take 1 tablet (5 mg total) by mouth daily. 07/24/16   Lada, Satira Anis, MD  montelukast (SINGULAIR) 10 MG tablet Take 1 tablet (10 mg total) by mouth at bedtime. For allergies and eczema 05/08/16   Lada, Satira Anis, MD  PROVENTIL HFA 108 (90 Base) MCG/ACT inhaler INHALE 2 PUFFS EVERY FOUR HOURS AS NEEDED SHORTNESS OF BREATH OR WHEEZING 06/19/16   Lada, Satira Anis, MD  sertraline (ZOLOFT) 100 MG tablet Take 2 tablets (200 mg total) by mouth daily. 05/08/16   Lada, Satira Anis, MD  traMADol (ULTRAM) 50 MG tablet Take 1 tablet (50 mg total) by mouth every 6 (six) hours as needed for moderate pain. 06/25/16   Arnetha Courser, MD  traZODone (DESYREL) 100 MG tablet Take 100 mg by mouth at bedtime.    [provider]  Vitamin D, Ergocalciferol, (DRISDOL) 50000 units CAPS capsule Take 1 capsule (50,000 Units total) by mouth every 30 (thirty) days. 12/11/16   Arnetha Courser, MD    Allergies Shrimp [shellfish allergy]  Family History  Problem Relation Age of Onset  . Hypertension Mother   . Heart disease Mother   . Hyperlipidemia Mother   . Diabetes Mother   . Heart disease Father   . Hyperlipidemia Father   . Hypertension Father   . Diabetes Sister   . Hypertension Daughter   . Diabetes Maternal Grandmother   . Diabetes Sister   . Cancer Neg Hx   . Stroke Neg Hx   . COPD Neg Hx   . Breast cancer Neg Hx     Social History Social History  Substance Use Topics  . Smoking status: Current Some Day Smoker    Packs/day: 0.25    Years: 20.00    Types: Cigarettes  . Smokeless tobacco: Never Used  . Alcohol use  No    Review of Systems Constitutional: No fever/chills Cardiovascular: Denies chest pain. Respiratory: Denies shortness of breath. Gastrointestinal: No abdominal pain.  No nausea, no vomiting.  Genitourinary: Negative for dysuria. Musculoskeletal: Negative for back pain. Skin: Negative for rash. Neurological: Negative for headaches, focal weakness or numbness.   ____________________________________________   PHYSICAL EXAM:  VITAL SIGNS: ED Triage Vitals [12/30/16 0833]  Enc Vitals Group     BP 125/82     Pulse Rate 99     Resp 16     Temp 98.1 F (36.7 C)     Temp Source Oral     SpO2 99 %     Weight 170 lb (77.1 kg)     Height 5\' 2"  (  1.575 m)     Head Circumference      Peak Flow      Pain Score      Pain Loc      Pain Edu?      Excl. in [place]?     Constitutional: Alert and oriented. Well appearing and in no acute distress. Eyes: Conjunctivae are normal. PERRL. EOMI. Head: Atraumatic. Neck: No stridor.   Cardiovascular: Normal rate, regular rhythm. Grossly normal heart sounds.  Good peripheral circulation. Respiratory: Normal respiratory effort.  No retractions. Lungs CTAB. Gastrointestinal: Soft and nontender. No distention.  No CVA tenderness. Musculoskeletal: No lower extremity tenderness nor edema.  No joint effusions. Neurologic:  Normal speech and language. No gross focal neurologic deficits are appreciated. No gait instability. Skin:  Skin is warm, dry and intact. No rash noted. Psychiatric: Mood and affect are normal. Speech and behavior are normal.  ____________________________________________   LABS (all labs ordered are listed, but only abnormal results are displayed)  Labs Reviewed  URINALYSIS, COMPLETE (UACMP) WITH MICROSCOPIC - Abnormal; Notable for the following:       Result Value   Color, Urine YELLOW (*)    APPearance CLEAR (*)    Glucose, UA >=500 (*)    Hgb urine dipstick MODERATE (*)    Bacteria, UA RARE (*)    Squamous Epithelial /  LPF 0-5 (*)    All other components within normal limits  GLUCOSE, CAPILLARY - Abnormal; Notable for the following:    Glucose-Capillary 393 (*)    All other components within normal limits  CBG MONITORING, ED     PROCEDURES  Procedure(s) performed: None  Procedures  Critical Care performed: No  ____________________________________________   INITIAL IMPRESSION / ASSESSMENT AND PLAN / ED COURSE  Pertinent labs & imaging results that were available during my care of the patient were reviewed by me and considered in my medical decision making (see chart for details).  Urinalysis did not show any RBCs or evidence of infection. Patient states that she is overdue for a physical and will call her PCP to arrange this. Patient denies any vaginal discomfort or discharge. Patient no longer has periods and she denies any rectal problems. Patient's urine did show glucose and patient states that she took her diabetes mellitus without eating this morning. Glucose fingerstick in the department was 393. In looking over the patient's records she has had uncontrolled diabetes for some time. We discussed following up with her PCP for this as well.      ____________________________________________   FINAL CLINICAL IMPRESSION(S) / ED DIAGNOSES  Final diagnoses:  Gross hematuria      NEW MEDICATIONS STARTED DURING THIS VISIT:  Discharge Medication List as of 12/30/2016 10:21 AM       Note:  This document was prepared using Dragon voice recognition software and may include unintentional dictation errors.    Johnn Hai, PA-C 12/30/16 1205    Nena Polio, MD 12/31/16 1539

## 2016-12-30 NOTE — Discharge Instructions (Signed)
Follow-up with your primary care doctor. Call Tuesday for an appointment for a complete physical.

## 2016-12-30 NOTE — ED Triage Notes (Signed)
Denies pain or urgency with urination, but states sees blood on tissue when wiping after voiding.  Onset of symptoms yesterday.

## 2017-01-03 ENCOUNTER — Ambulatory Visit: Payer: Medicaid Other | Admitting: General Surgery

## 2017-01-04 ENCOUNTER — Ambulatory Visit: Payer: Medicaid Other | Admitting: Family Medicine

## 2017-01-14 ENCOUNTER — Encounter: Payer: Self-pay | Admitting: Family Medicine

## 2017-01-14 ENCOUNTER — Ambulatory Visit (INDEPENDENT_AMBULATORY_CARE_PROVIDER_SITE_OTHER): Payer: Medicaid Other | Admitting: Family Medicine

## 2017-01-14 DIAGNOSIS — IMO0002 Reserved for concepts with insufficient information to code with codable children: Secondary | ICD-10-CM

## 2017-01-14 DIAGNOSIS — F4312 Post-traumatic stress disorder, chronic: Secondary | ICD-10-CM

## 2017-01-14 DIAGNOSIS — F331 Major depressive disorder, recurrent, moderate: Secondary | ICD-10-CM

## 2017-01-14 DIAGNOSIS — I10 Essential (primary) hypertension: Secondary | ICD-10-CM | POA: Diagnosis not present

## 2017-01-14 DIAGNOSIS — E1065 Type 1 diabetes mellitus with hyperglycemia: Secondary | ICD-10-CM | POA: Diagnosis not present

## 2017-01-14 DIAGNOSIS — E559 Vitamin D deficiency, unspecified: Secondary | ICD-10-CM

## 2017-01-14 DIAGNOSIS — E1365 Other specified diabetes mellitus with hyperglycemia: Secondary | ICD-10-CM

## 2017-01-14 MED ORDER — VITAMIN D (ERGOCALCIFEROL) 1.25 MG (50000 UNIT) PO CAPS: 50000.0000 [IU] | ORAL_CAPSULE | ORAL | 0 refills | Status: DC

## 2017-01-14 NOTE — Assessment & Plan Note (Signed)
Start back on vitamin D 50k iu once a week for four weeks, then once a month, will try to bring level up; plan to recheck labs at f/u

## 2017-01-14 NOTE — Patient Instructions (Addendum)
Take a vitamin D capsule once a week for four more weeks, then back to once a month Call your psychiatrist today about your mood and see if your medicine can be adjusted If you have any thoughts of hurting yourself or others, please call the crisis line or 911 or have someone take you to the emergency department Try to spend more time outside (not during the heat of the day), and get some fresh air and enjoy nature

## 2017-01-14 NOTE — Assessment & Plan Note (Signed)
Seeing endocrinologist for management; eye exam UTD (April); foot exam by MD today; medicine list reviewed to the best of our ability here, trying to match up with endocrinology note; patient to check with Dr. Gabriel Carina to see if she is really supposed to be taking Tradjenta while on Bydureon (my guess is NO); patient does not think additional diabetic teaching would be helpful

## 2017-01-14 NOTE — Assessment & Plan Note (Signed)
Supportive listening provided; patient agrees to call crisis line she has or 911 or go to the ER if she has plans to harm herself; she says her psychiatrist and counselor are aware of her condition; encouraged her to call today to see if medicines can/should be adjusted

## 2017-01-14 NOTE — Assessment & Plan Note (Signed)
Working with counselor and psychiatrist; encouraged patient to keep close f/u, call today to see if medicines should be adjusted

## 2017-01-14 NOTE — Progress Notes (Signed)
BP 126/74   Pulse 93   Temp 97.4 F (36.3 C) (Oral)   Resp 16   Wt 170 lb 4.8 oz (77.2 kg)   LMP 04/29/2015 (Approximate)   SpO2 99%   BMI 31.15 kg/m    Subjective:    Patient ID: Allison Cooke, female    DOB: 07-May-1961, 56 y.o.   MRN: [id number]  HPI: Allison Cooke is a 56 y.o. female  Chief Complaint  Patient presents with  . Follow-up    HPI Here for f/u She struggles with chronic depression, PTSD Seeing psych every two weeks; still has thoughts of self-harm, medication; every two weeks, sees counselor too; if she were to hurt herself, she says she would take pills; her niece committed suicide; she has a crisis line number, and her counselor and psychiatrist are aware  Type 2 diabetes; on Bydureon and insulin (short and long-acting); no problems with feet; last eye exam April 20th; does not think extra teaching would be helpful; checking sugars 2x a day; last blood sugar around 320; mother and two sisters have diabetes, on insulin; she sees Dr. Gabriel Carina for her diabetes  Vitamin D deficiency; taking 50k iu once a month; still feeling tired  Depression screen [facility] 2/9 01/14/2017 10/04/2016 06/25/2016 05/08/2016 02/03/2016  Decreased Interest 0 0 1 0 1  Down, Depressed, Hopeless 1 1 1 1 3   PHQ - 2 Score 1 1 2 1 4   Altered sleeping - - 1 - 3  Tired, decreased energy - - 1 - 3  Change in appetite - - 1 - 1  Feeling bad or failure about yourself  - - 1 - 3  Trouble concentrating - - 1 - 3  Moving slowly or fidgety/restless - - 1 - 3  Suicidal thoughts - - 1 - 2  PHQ-9 Score - - 9 - 22  Difficult doing work/chores - - Not difficult at all - Extremely dIfficult   Relevant past medical, surgical, family and social history reviewed Past Medical History:  Diagnosis Date  . Anemia   . Arthritis   . Chronic post-traumatic stress disorder (PTSD) 01/29/2016  . Depression   . Diabetes mellitus without complication ([place])   . Diabetes, type 1.5, uncontrolled, managed as type 1  ([place]) 06/28/2015  . Headache   . Heart murmur   . High cholesterol   . Hypertension   . Lumbar back pain 02/03/2016  . Seizures ([place])    as a child  . SOB (shortness of breath)    Past Surgical History:  Procedure Laterality Date  . BREAST EXCISIONAL BIOPSY Left 2016   NEG  . BREAST LUMPECTOMY Left 07/14/2015   Procedure: BREAST LUMPECTOMY;  Surgeon: Christene Lye, MD;  Location: [facility] ORS;  Service: General;  Laterality: Left;  . BREAST SURGERY Left   . CESAREAN SECTION    . CHOLECYSTECTOMY    . COLONOSCOPY WITH PROPOFOL N/A 03/06/2016   Procedure: COLONOSCOPY WITH PROPOFOL;  Surgeon: [name], MD;  Location: [facility] ENDOSCOPY;  Service: Endoscopy;  Laterality: N/A;  . DIRECT LARYNGOSCOPY N/A 04/26/2015   Procedure: Microlaryngoscopy with exxcision of vocal cord polyp ;  Surgeon: Beverly Gust, MD;  Location: [facility] ORS;  Service: ENT;  Laterality: N/A;  . FINE NEEDLE ASPIRATION Left    breast cyst, benign, age 41?   Family History  Problem Relation Age of Onset  . Hypertension Mother   . Heart disease Mother   . Hyperlipidemia Mother   . Diabetes Mother   .  Heart disease Father   . Hyperlipidemia Father   . Hypertension Father   . Diabetes Sister   . Hypertension Daughter   . Diabetes Maternal Grandmother   . Diabetes Sister   . Cancer Neg Hx   . Stroke Neg Hx   . COPD Neg Hx   . Breast cancer Neg Hx    Social History   Social History  . Marital status: Widowed    Spouse name: N/A  . Number of children: N/A  . Years of education: N/A   Occupational History  . Not on file.   Social History Main Topics  . Smoking status: Current Some Day Smoker    Packs/day: 0.25    Years: 20.00    Types: Cigarettes  . Smokeless tobacco: Never Used  . Alcohol use No  . Drug use: No  . Sexual activity: Yes    Birth control/ protection: None   Other Topics Concern  . Not on file   Social History Narrative  . No narrative on file   Interim medical history since  last visit reviewed. Allergies and medications reviewed  Review of Systems Per HPI unless specifically indicated above     Objective:    BP 126/74   Pulse 93   Temp 97.4 F (36.3 C) (Oral)   Resp 16   Wt 170 lb 4.8 oz (77.2 kg)   LMP 04/29/2015 (Approximate)   SpO2 99%   BMI 31.15 kg/m   Wt Readings from Last 3 Encounters:  01/14/17 170 lb 4.8 oz (77.2 kg)  12/30/16 170 lb (77.1 kg)  10/04/16 171 lb 9.6 oz (77.8 kg)    Physical Exam  Constitutional: She appears well-developed and well-nourished. No distress.  Weight stable  Cardiovascular: Normal rate and regular rhythm.   Pulmonary/Chest: Effort normal and breath sounds normal.  Abdominal: She exhibits no distension.  Neurological: She is alert.  Skin: No pallor.  Psychiatric: Her mood appears not anxious. Her affect is blunt. Her affect is not labile and not inappropriate. Cognition and memory are not impaired. She exhibits a depressed mood (flattened affect, not tearful).  Good eye contact with examiner   Diabetic Foot Form - Detailed   Diabetic Foot Exam - detailed Diabetic Foot exam was performed with the following findings:  Yes 01/14/2017  9:24 AM  Visual Foot Exam completed.:  Yes  Are the toenails ingrown?:  No Normal Range of Motion:  Yes Pulse Foot Exam completed.:  Yes  Right Dorsalis Pedis:  Present Left Dorsalis Pedis:  Present  Sensory Foot Exam Completed.:  Yes Swelling:  No Semmes-Weinstein Monofilament Test R Site 1-Great Toe:  Pos L Site 1-Great Toe:  Pos  R Site 4:  Pos L Site 4:  Pos  R Site 5:  Pos L Site 5:  Pos          Assessment & Plan:   Problem List Items Addressed This Visit      Cardiovascular and Mediastinum   Essential hypertension, benign    Good control of blood pressure today; continue medicine        Endocrine   Diabetes, type 1.5, uncontrolled, managed as type 2 [facility])    Seeing endocrinologist for management; eye exam UTD (April); foot exam by MD today; medicine list  reviewed to the best of our ability here, trying to match up with endocrinology note; patient to check with Dr. Gabriel Carina to see if she is really supposed to be taking Tradjenta while on Bydureon (my  guess is NO); patient does not think additional diabetic teaching would be helpful      Relevant Medications   metFORMIN (GLUCOPHAGE-XR) 500 MG 24 hr tablet     Other   Vitamin D deficiency    Start back on vitamin D 50k iu once a week for four weeks, then once a month, will try to bring level up; plan to recheck labs at f/u      Depression, major, recurrent ([place])    Supportive listening provided; patient agrees to call crisis line she has or 911 or go to the ER if she has plans to harm herself; she says her psychiatrist and counselor are aware of her condition; encouraged her to call today to see if medicines can/should be adjusted      Chronic post-traumatic stress disorder (PTSD)    Working with counselor and psychiatrist; encouraged patient to keep close f/u, call today to see if medicines should be adjusted          Follow up plan: Return in about 3 months (around 04/16/2017) for twenty minute follow-up with fasting labs.  An after-visit summary was printed and given to the patient at [place].  Please see the patient instructions which may contain other information and recommendations beyond what is mentioned above in the assessment and plan.  Meds ordered this encounter  Medications  . Vitamin D, Ergocalciferol, (DRISDOL) 50000 units CAPS capsule    Sig: Take 1 capsule (50,000 Units total) by mouth every 7 (seven) days.    Dispense:  4 capsule    Refill:  0  . metFORMIN (GLUCOPHAGE-XR) 500 MG 24 hr tablet    Sig: Take 500 mg by mouth 4 (four) times daily.    No orders of the defined types were placed in this encounter.

## 2017-01-14 NOTE — Assessment & Plan Note (Addendum)
Good control of blood pressure today; continue medicine

## 2017-01-21 ENCOUNTER — Ambulatory Visit: Payer: Medicaid Other | Admitting: General Surgery

## 2017-01-28 ENCOUNTER — Ambulatory Visit: Payer: Medicaid Other | Admitting: General Surgery

## 2017-04-02 ENCOUNTER — Encounter: Payer: Self-pay | Admitting: *Deleted

## 2017-04-26 ENCOUNTER — Encounter: Payer: Self-pay | Admitting: Family Medicine

## 2017-04-26 ENCOUNTER — Ambulatory Visit (INDEPENDENT_AMBULATORY_CARE_PROVIDER_SITE_OTHER): Payer: Medicaid Other | Admitting: Family Medicine

## 2017-04-26 VITALS — BP 118/78 | HR 75 | Temp 97.5°F | Resp 16 | Ht 62.0 in | Wt 153.6 lb

## 2017-04-26 DIAGNOSIS — F331 Major depressive disorder, recurrent, moderate: Secondary | ICD-10-CM

## 2017-04-26 DIAGNOSIS — E1365 Other specified diabetes mellitus with hyperglycemia: Secondary | ICD-10-CM

## 2017-04-26 DIAGNOSIS — E559 Vitamin D deficiency, unspecified: Secondary | ICD-10-CM | POA: Diagnosis not present

## 2017-04-26 DIAGNOSIS — Z720 Tobacco use: Secondary | ICD-10-CM

## 2017-04-26 DIAGNOSIS — F4312 Post-traumatic stress disorder, chronic: Secondary | ICD-10-CM

## 2017-04-26 DIAGNOSIS — E1065 Type 1 diabetes mellitus with hyperglycemia: Secondary | ICD-10-CM

## 2017-04-26 DIAGNOSIS — R634 Abnormal weight loss: Secondary | ICD-10-CM

## 2017-04-26 DIAGNOSIS — Z5181 Encounter for therapeutic drug level monitoring: Secondary | ICD-10-CM

## 2017-04-26 DIAGNOSIS — IMO0002 Reserved for concepts with insufficient information to code with codable children: Secondary | ICD-10-CM

## 2017-04-26 DIAGNOSIS — E785 Hyperlipidemia, unspecified: Secondary | ICD-10-CM

## 2017-04-26 DIAGNOSIS — Z23 Encounter for immunization: Secondary | ICD-10-CM

## 2017-04-26 DIAGNOSIS — I10 Essential (primary) hypertension: Secondary | ICD-10-CM | POA: Diagnosis not present

## 2017-04-26 MED ORDER — METFORMIN HCL ER 500 MG PO TB24: 1000.0000 mg | ORAL_TABLET | Freq: Two times a day (BID) | ORAL | 5 refills | Status: DC

## 2017-04-26 MED ORDER — LISINOPRIL 5 MG PO TABS: 5.0000 mg | ORAL_TABLET | Freq: Every day | ORAL | 5 refills | Status: DC

## 2017-04-26 MED ORDER — ATORVASTATIN CALCIUM 80 MG PO TABS: 80.0000 mg | ORAL_TABLET | Freq: Every day | ORAL | 5 refills | Status: DC

## 2017-04-26 MED ORDER — INSULIN ASPART 100 UNIT/ML FLEXPEN: 16.0000 [IU] | PEN_INJECTOR | Freq: Three times a day (TID) | SUBCUTANEOUS | 6 refills | Status: DC

## 2017-04-26 MED ORDER — MONTELUKAST SODIUM 10 MG PO TABS: 10.0000 mg | ORAL_TABLET | Freq: Every day | ORAL | 11 refills | Status: AC

## 2017-04-26 MED ORDER — INSULIN DETEMIR 100 UNIT/ML FLEXPEN: 50.0000 [IU] | PEN_INJECTOR | Freq: Every evening | SUBCUTANEOUS | 6 refills | Status: DC

## 2017-04-26 NOTE — Assessment & Plan Note (Signed)
Check level and supplement as indicated 

## 2017-04-26 NOTE — Assessment & Plan Note (Signed)
Cessation encouraged 

## 2017-04-26 NOTE — Patient Instructions (Addendum)
Try to limit saturated fats in your diet (bologna, hot dogs, barbeque, cheeseburgers, hamburgers, steak, bacon, sausage, cheese, etc.) and get more fresh fruits, vegetables, and whole grains  I do encourage you to quit smoking Call [phone] to sign up for smoking cessation classes You can call 1-800-QUIT-NOW to talk with a smoking cessation coach We'll get labs today If you have not heard anything from my staff in a week about any orders/referrals/studies from today, please contact us here to follow-up [phone]   Steps to Quit Smoking Smoking tobacco can be bad for your health. It can also affect almost every organ in your body. Smoking puts you and people around you at risk for many serious long-lasting (chronic) diseases. Quitting smoking is hard, but it is one of the best things that you can do for your health. It is never too late to quit. What are the benefits of quitting smoking? When you quit smoking, you lower your risk for getting serious diseases and conditions. They can include:  Lung cancer or lung disease.  Heart disease.  Stroke.  Heart attack.  Not being able to have children (infertility).  Weak bones (osteoporosis) and broken bones (fractures).  If you have coughing, wheezing, and shortness of breath, those symptoms may get better when you quit. You may also get sick less often. If you are pregnant, quitting smoking can help to lower your chances of having a baby of low birth weight. What can I do to help me quit smoking? Talk with your doctor about what can help you quit smoking. Some things you can do (strategies) include:  Quitting smoking totally, instead of slowly cutting back how much you smoke over a period of time.  Going to in-person counseling. You are more likely to quit if you go to many counseling sessions.  Using resources and support systems, such as: ? Database administrator with a Social worker. ? Phone quitlines. ? Clinical research associate. ? Support groups or group counseling. ? Text messaging programs. ? Mobile phone apps or applications.  Taking medicines. Some of these medicines may have nicotine in them. If you are pregnant or breastfeeding, do not take any medicines to quit smoking unless your doctor says it is okay. Talk with your doctor about counseling or other things that can help you.  Talk with your doctor about using more than one strategy at the same time, such as taking medicines while you are also going to in-person counseling. This can help make quitting easier. What things can I do to make it easier to quit? Quitting smoking might feel very hard at first, but there is a lot that you can do to make it easier. Take these steps:  Talk to your family and friends. Ask them to support and encourage you.  Call phone quitlines, reach out to support groups, or work with a Social worker.  Ask people who smoke to not smoke around you.  Avoid places that make you want (trigger) to smoke, such as: ? Bars. ? Parties. ? Smoke-break areas at work.  Spend time with people who do not smoke.  Lower the stress in your life. Stress can make you want to smoke. Try these things to help your stress: ? Getting regular exercise. ? Deep-breathing exercises. ? Yoga. ? Meditating. ? Doing a body scan. To do this, close your eyes, focus on one area of your body at a time from head to toe, and notice which parts of your body are tense. Try to  relax the muscles in those areas.  Download or buy apps on your mobile phone or tablet that can help you stick to your quit plan. There are many free apps, such as QuitGuide from the [employer] Office manager for Disease Control and Prevention). You can find more support from smokefree.gov and other websites.  This information is not intended to replace advice given to you by your health care provider. Make sure you discuss any questions you have with your health care provider. Document Released:  05/19/2009 Document Revised: 03/20/2016 Document Reviewed: 12/07/2014 Elsevier Interactive Patient Education  2018 [place] With Depression Everyone experiences occasional disappointment, sadness, and loss in their lives. When you are feeling down, blue, or sad for at least 2 weeks in a row, it may mean that you have depression. Depression can affect your thoughts and feelings, relationships, daily activities, and physical health. It is caused by changes in the way your brain functions. If you receive a diagnosis of depression, your health care provider will tell you which type of depression you have and what treatment options are available to you. If you are living with depression, there are ways to help you recover from it and also ways to prevent it from coming back. How to cope with lifestyle changes Coping with stress Stress is your body's reaction to life changes and events, both good and bad. Stressful situations may include:  Getting married.  The death of a spouse.  Losing a job.  Retiring.  Having a baby.  Stress can last just a few hours or it can be ongoing. Stress can play a major role in depression, so it is important to learn both how to cope with stress and how to think about it differently. Talk with your health care provider or a counselor if you would like to learn more about stress reduction. He or she may suggest some stress reduction techniques, such as:  Music therapy. This can include creating music or listening to music. Choose music that you enjoy and that inspires you.  Mindfulness-based meditation. This kind of meditation can be done while sitting or walking. It involves being aware of your normal breaths, rather than trying to control your breathing.  Centering prayer. This is a kind of meditation that involves focusing on a spiritual word or phrase. Choose a word, phrase, or sacred image that is meaningful to you and that brings you  peace.  Deep breathing. To do this, expand your stomach and inhale slowly through your nose. Hold your breath for 3-5 seconds, then exhale slowly, allowing your stomach muscles to relax.  Muscle relaxation. This involves intentionally tensing muscles then relaxing them.  Choose a stress reduction technique that fits your lifestyle and personality. Stress reduction techniques take time and practice to develop. Set aside 5-15 minutes a day to do them. Therapists can offer training in these techniques. The training may be covered by some insurance plans. Other things you can do to manage stress include:  Keeping a stress diary. This can help you learn what triggers your stress and ways to control your response.  Understanding what your limits are and saying no to requests or events that lead to a schedule that is too full.  Thinking about how you respond to certain situations. You may not be able to control everything, but you can control how you react.  Adding humor to your life by watching funny films or TV shows.  Making time for activities that help you relax  and not feeling guilty about spending your time this way.  Medicines Your health care provider may suggest certain medicines if he or she feels that they will help improve your condition. Avoid using alcohol and other substances that may prevent your medicines from working properly (may interact). It is also important to:  Talk with your pharmacist or health care provider about all the medicines that you take, their possible side effects, and what medicines are safe to take together.  Make it your goal to take part in all treatment decisions (shared decision-making). This includes giving input on the side effects of medicines. It is best if shared decision-making with your health care provider is part of your total treatment plan.  If your health care provider prescribes a medicine, you may not notice the full benefits of it for 4-8  weeks. Most people who are treated for depression need to be on medicine for at least 6-12 months after they feel better. If you are taking medicines as part of your treatment, do not stop taking medicines without first talking to your health care provider. You may need to have the medicine slowly decreased (tapered) over time to decrease the risk of harmful side effects. Relationships Your health care provider may suggest family therapy along with individual therapy and drug therapy. While there may not be family problems that are causing you to feel depressed, it is still important to make sure your family learns as much as they can about your mental health. Having your family's support can help make your treatment successful. How to recognize changes in your condition Everyone has a different response to treatment for depression. Recovery from major depression happens when you have not had signs of major depression for two months. This may mean that you will start to:  Have more interest in doing activities.  Feel less hopeless than you did 2 months ago.  Have more energy.  Overeat less often, or have better or improving appetite.  Have better concentration.  Your health care provider will work with you to decide the next steps in your recovery. It is also important to recognize when your condition is getting worse. Watch for these signs:  Having fatigue or low energy.  Eating too much or too little.  Sleeping too much or too little.  Feeling restless, agitated, or hopeless.  Having trouble concentrating or making decisions.  Having unexplained physical complaints.  Feeling irritable, angry, or aggressive.  Get help as soon as you or your family members notice these symptoms coming back. How to get support and help from others How to talk with friends and family members about your condition Talking to friends and family members about your condition can provide you with one way  to get support and guidance. Reach out to trusted friends or family members, explain your symptoms to them, and let them know that you are working with a health care provider to treat your depression. Financial resources Not all insurance plans cover mental health care, so it is important to check with your insurance carrier. If paying for co-pays or counseling services is a problem, search for a local or county mental health care center. They may be able to offer public mental health care services at low or no cost when you are not able to see a private health care provider. If you are taking medicine for depression, you may be able to get the generic form, which may be less expensive. Some makers of prescription  medicines also offer help to patients who cannot afford the medicines they need. Follow these instructions at home:  Get the right amount and quality of sleep.  Cut down on using caffeine, tobacco, alcohol, and other potentially harmful substances.  Try to exercise, such as walking or lifting small weights.  Take over-the-counter and prescription medicines only as told by your health care provider.  Eat a healthy diet that includes plenty of vegetables, fruits, whole grains, low-fat dairy products, and lean protein. Do not eat a lot of foods that are high in solid fats, added sugars, or salt.  Keep all follow-up visits as told by your health care provider. This is important. Contact a health care provider if:  You stop taking your antidepressant medicines, and you have any of these symptoms: ? Nausea. ? Headache. ? Feeling lightheaded. ? Chills and body aches. ? Not being able to sleep (insomnia).  You or your friends and family think your depression is getting worse. Get help right away if:  You have thoughts of hurting yourself or others. If you ever feel like you may hurt yourself or others, or have thoughts about taking your own life, get help right away. You can go to  your nearest emergency department or call:  Your local emergency services (911 in the [place]  A suicide crisis helpline, such as the [place] at 612-577-8369. This is open 24-hours a day.  Summary  If you are living with depression, there are ways to help you recover from it and also ways to prevent it from coming back.  Work with your health care team to create a management plan that includes counseling, stress management techniques, and healthy lifestyle habits. This information is not intended to replace advice given to you by your health care provider. Make sure you discuss any questions you have with your health care provider. Document Released: 06/25/2016 Document Revised: 06/25/2016 Document Reviewed: 06/25/2016 Elsevier Interactive Patient Education  [employer].

## 2017-04-26 NOTE — Progress Notes (Signed)
BP 118/78 (BP Location: Right Arm, Patient Position: Sitting, Cuff Size: Large)   Pulse 75   Temp (!) 97.5 F (36.4 C) (Oral)   Resp 16   Ht 5\' 2"  (1.575 m)   Wt 153 lb 9.6 oz (69.7 kg)   LMP 04/29/2015 (Approximate)   SpO2 98%   BMI 28.09 kg/m    Subjective:    Patient ID: Allison Cooke, female    DOB: 09-Sep-1960, 56 y.o.   MRN: [id number]  HPI: Allison Cooke is a 56 y.o. female  Chief Complaint  Patient presents with  . Follow-up    patient is here for her 3 month f/u  . Labs Only    patient stated that she ate some strawberries  . Diabetes    patient stated that she tries to check her blood sugar. highest 300 & lowest 120   . Post-Traumatic Stress Disorder    patient stated that her sx are about the same  . Other    patient stated that she is out of the vitamin d  . Joint Pain    patient has been having some intermittent right ankle pain  . Medication Refill    all meds prescribed by Dr. Sanda Klein  . Immunizations    flu shot    HPI Patient is here for f/u  She has type 2 diabetes; has been seeing endocrinologist, Dr. [name]; last visit was May 22nd; I reviewed Care Everywhere, and there are no lab results for her (no A1c in particular); last A1c here was in March and it was greater than 14  Hyperlipidemia; last cholesterol panel done March 1st; trying to avoid fatty meats;  Lab Results  Component Value Date   CHOL 286 (H) 10/04/2016   HDL 38 (L) 10/04/2016   LDLCALC 172 (H) 10/04/2016   TRIG 380 (H) 10/04/2016   CHOLHDL 7.5 (H) 10/04/2016   Vitamin D deficiency; last vit D level was 17 on March 1st  She has PTSD and depression, seeing psychiatrist, just saw her this week and medicines were adjusted; sertraline was stopped; started haldol  Joint pain in the right ankle; comes and goes  Weight loss with trying to eat better  Depression screen [facility] 2/9 04/26/2017 01/14/2017 10/04/2016 06/25/2016 05/08/2016  Decreased Interest 1 0 0 1 0  Down,  Depressed, Hopeless 2 1 1 1 1   PHQ - 2 Score 3 1 1 2 1   Altered sleeping 3 - - 1 -  Tired, decreased energy 3 - - 1 -  Change in appetite 3 - - 1 -  Feeling bad or failure about yourself  3 - - 1 -  Trouble concentrating 3 - - 1 -  Moving slowly or fidgety/restless 0 - - 1 -  Suicidal thoughts 1 - - 1 -  PHQ-9 Score 19 - - 9 -  Difficult doing work/chores Very difficult - - Not difficult at all -    Relevant past medical, surgical, family and social history reviewed Past Medical History:  Diagnosis Date  . Anemia   . Arthritis   . Chronic post-traumatic stress disorder (PTSD) 01/29/2016  . Depression   . Diabetes mellitus without complication ([place])   . Diabetes, type 1.5, uncontrolled, managed as type 1 ([place]) 06/28/2015  . Headache   . Heart murmur   . High cholesterol   . Hypertension   . Lumbar back pain 02/03/2016  . Seizures ([place])    as a child  . SOB (shortness  of breath)    Past Surgical History:  Procedure Laterality Date  . BREAST EXCISIONAL BIOPSY Left 2016   NEG  . BREAST LUMPECTOMY Left 07/14/2015   Procedure: BREAST LUMPECTOMY;  Surgeon: Christene Lye, MD;  Location: [facility] ORS;  Service: General;  Laterality: Left;  . BREAST SURGERY Left   . CESAREAN SECTION    . CHOLECYSTECTOMY    . COLONOSCOPY WITH PROPOFOL N/A 03/06/2016   Procedure: COLONOSCOPY WITH PROPOFOL;  Surgeon: [name], MD;  Location: [facility] ENDOSCOPY;  Service: Endoscopy;  Laterality: N/A;  . DIRECT LARYNGOSCOPY N/A 04/26/2015   Procedure: Microlaryngoscopy with exxcision of vocal cord polyp ;  Surgeon: Beverly Gust, MD;  Location: [facility] ORS;  Service: ENT;  Laterality: N/A;  . FINE NEEDLE ASPIRATION Left    breast cyst, benign, age 52?   Family History  Problem Relation Age of Onset  . Hypertension Mother   . Heart disease Mother   . Hyperlipidemia Mother   . Diabetes Mother   . Heart disease Father   . Hyperlipidemia Father   . Hypertension Father   . Diabetes Sister   .  Hypertension Daughter   . Diabetes Maternal Grandmother   . Diabetes Sister   . Cancer Neg Hx   . Stroke Neg Hx   . COPD Neg Hx   . Breast cancer Neg Hx    Social History   Social History  . Marital status: Widowed    Spouse name: N/A  . Number of children: N/A  . Years of education: N/A   Occupational History  . Not on file.   Social History Main Topics  . Smoking status: Current Some Day Smoker    Packs/day: 0.25    Years: 20.00    Types: Cigarettes  . Smokeless tobacco: Never Used  . Alcohol use No  . Drug use: No  . Sexual activity: Yes    Birth control/ protection: None   Other Topics Concern  . Not on file   Social History Narrative  . No narrative on file    Interim medical history since last visit reviewed. Allergies and medications reviewed  Review of Systems Per HPI unless specifically indicated above     Objective:    BP 118/78 (BP Location: Right Arm, Patient Position: Sitting, Cuff Size: Large)   Pulse 75   Temp (!) 97.5 F (36.4 C) (Oral)   Resp 16   Ht 5\' 2"  (1.575 m)   Wt 153 lb 9.6 oz (69.7 kg)   LMP 04/29/2015 (Approximate)   SpO2 98%   BMI 28.09 kg/m   Wt Readings from Last 3 Encounters:  04/26/17 153 lb 9.6 oz (69.7 kg)  01/14/17 170 lb 4.8 oz (77.2 kg)  12/30/16 170 lb (77.1 kg)    Physical Exam  Constitutional: She appears well-developed and well-nourished. No distress.  Weight stable  Cardiovascular: Normal rate and regular rhythm.   Pulmonary/Chest: Effort normal and breath sounds normal. She has no wheezes.  Abdominal: She exhibits no distension.  Neurological: She is alert.  Skin: No pallor.  Psychiatric: Her mood appears not anxious. Her affect is blunt. Her affect is not labile and not inappropriate. Cognition and memory are not impaired. She exhibits a depressed mood (flattened affect, not tearful).  Good eye contact with examiner   Diabetic Foot Form - Detailed   Diabetic Foot Exam - detailed Diabetic Foot exam  was performed with the following findings:  Yes 04/26/2017 10:00 AM  Visual Foot Exam completed.:  Yes  Pulse Foot Exam completed.:  Yes  Right Dorsalis Pedis:  Present Left Dorsalis Pedis:  Present  Sensory Foot Exam Completed.:  Yes Semmes-Weinstein Monofilament Test R Site 1-Great Toe:  Pos L Site 1-Great Toe:  Pos    Comments:  Sensation intact to monofilament testing B, but slightly reduced in both feet plantar surface of toes     Results for orders placed or performed in visit on 01/14/17  HM DIABETES EYE EXAM  Result Value Ref Range   HM Diabetic Eye Exam No Retinopathy No Retinopathy      Assessment & Plan:   Problem List Items Addressed This Visit      Cardiovascular and Mediastinum   Essential hypertension, benign    Goal systolic less than 798; try DASH guidelines      Relevant Medications   lisinopril (PRINIVIL,ZESTRIL) 5 MG tablet   atorvastatin (LIPITOR) 80 MG tablet     Endocrine   Diabetes, type 1.5, uncontrolled, managed as type 2 ([place]) - Primary    Was seeing endocrinologist; does not appear that she is compliant with visits, labs; will check A1c here today; foot exam by MD here today      Relevant Medications   insulin aspart (NOVOLOG) 100 UNIT/ML FlexPen   Insulin Detemir (LEVEMIR) 100 UNIT/ML Pen   metFORMIN (GLUCOPHAGE-XR) 500 MG 24 hr tablet   lisinopril (PRINIVIL,ZESTRIL) 5 MG tablet   atorvastatin (LIPITOR) 80 MG tablet   Other Relevant Orders   Microalbumin / creatinine urine ratio   Lipid panel   Hemoglobin A1c   Ambulatory referral to Ophthalmology     Other   Vitamin D deficiency    Check level and supplement as indicated      Relevant Orders   VITAMIN D 25 Hydroxy (Vit-D Deficiency, Fractures)   Tobacco abuse    Cessation encouraged      Medication monitoring encounter    Check labs today      Relevant Orders   COMPLETE METABOLIC PANEL WITH GFR   Hyperlipidemia LDL goal <100    Poor control of lipids; check cholesterol  panel here today      Relevant Medications   lisinopril (PRINIVIL,ZESTRIL) 5 MG tablet   atorvastatin (LIPITOR) 80 MG tablet   Other Relevant Orders   Lipid panel   Depression, major, recurrent ([place])    Patient to contact psychiatrist, see AVS      Relevant Medications   traZODone (DESYREL) 150 MG tablet   Chronic post-traumatic stress disorder (PTSD)    Contact psychiatrist, see AVS       Other Visit Diagnoses    Weight loss       Relevant Orders   TSH   Needs flu shot       Relevant Orders   Flu Vaccine QUAD 6+ mos PF IM (Fluarix Quad PF) (Completed)       Follow up plan: Return in about 3 months (around 07/26/2017) for twenty minute follow-up with fasting labs.  An after-visit summary was printed and given to the patient at [facility].  Please see the patient instructions which may contain other information and recommendations beyond what is mentioned above in the assessment and plan.  Meds ordered this encounter  Medications  . haloperidol (HALDOL) 5 MG tablet    Sig: Take 5 mg by mouth at bedtime.  . insulin aspart (NOVOLOG) 100 UNIT/ML FlexPen    Sig: Inject 16 Units into the skin 3 (three) times daily before meals.  Dispense:  5 pen    Refill:  6  . Insulin Detemir (LEVEMIR) 100 UNIT/ML Pen    Sig: Inject 50 Units into the skin Nightly.    Dispense:  5 pen    Refill:  6  . metFORMIN (GLUCOPHAGE-XR) 500 MG 24 hr tablet    Sig: Take 2 tablets (1,000 mg total) by mouth 2 (two) times daily.    Dispense:  120 tablet    Refill:  5  . traZODone (DESYREL) 150 MG tablet    Sig: Take by mouth at bedtime.  Marland Kitchen lisinopril (PRINIVIL,ZESTRIL) 5 MG tablet    Sig: Take 1 tablet (5 mg total) by mouth daily.    Dispense:  30 tablet    Refill:  5  . montelukast (SINGULAIR) 10 MG tablet    Sig: Take 1 tablet (10 mg total) by mouth at bedtime. For allergies and eczema    Dispense:  30 tablet    Refill:  11  . atorvastatin (LIPITOR) 80 MG tablet    Sig: Take 1 tablet (80  mg total) by mouth daily. For cholesterol    Dispense:  30 tablet    Refill:  5    Orders Placed This Encounter  Procedures  . Flu Vaccine QUAD 6+ mos PF IM (Fluarix Quad PF)  . Microalbumin / creatinine urine ratio  . Lipid panel  . Hemoglobin A1c  . COMPLETE METABOLIC PANEL WITH GFR  . VITAMIN D 25 Hydroxy (Vit-D Deficiency, Fractures)  . TSH  . Ambulatory referral to Ophthalmology

## 2017-04-26 NOTE — Assessment & Plan Note (Signed)
Was seeing endocrinologist; does not appear that she is compliant with visits, labs; will check A1c here today; foot exam by MD here today

## 2017-04-26 NOTE — Assessment & Plan Note (Signed)
Check labs today.

## 2017-04-26 NOTE — Assessment & Plan Note (Signed)
Goal systolic less than 599; try DASH guidelines

## 2017-04-26 NOTE — Assessment & Plan Note (Signed)
Poor control of lipids; check cholesterol panel here today

## 2017-04-26 NOTE — Assessment & Plan Note (Signed)
Contact psychiatrist, see AVS

## 2017-04-26 NOTE — Assessment & Plan Note (Signed)
Patient to contact psychiatrist, see AVS

## 2017-04-27 LAB — MICROALBUMIN / CREATININE URINE RATIO
CREATININE, URINE: 25 mg/dL (ref 20–275)
MICROALB/CREAT RATIO: 128 ug/mg{creat} — AB (ref <30)
Microalb, Ur: 3.2 mg/dL

## 2017-04-27 LAB — COMPLETE METABOLIC PANEL WITH GFR
AG RATIO: 1.3 (calc) (ref 1.0–2.5)
ALT: 11 U/L (ref 6–29)
AST: 6 U/L — ABNORMAL LOW (ref 10–35)
Albumin: 4.8 g/dL (ref 3.6–5.1)
Alkaline phosphatase (APISO): 164 U/L — ABNORMAL HIGH (ref 33–130)
BUN/Creatinine Ratio: 22 (calc) (ref 6–22)
BUN: 26 mg/dL — ABNORMAL HIGH (ref 7–25)
CALCIUM: 10.4 mg/dL (ref 8.6–10.4)
CHLORIDE: 90 mmol/L — AB (ref 98–110)
CO2: 24 mmol/L (ref 20–32)
Creat: 1.17 mg/dL — ABNORMAL HIGH (ref 0.50–1.05)
GFR, EST AFRICAN AMERICAN: 61 mL/min/{1.73_m2} (ref >=60)
GFR, EST NON AFRICAN AMERICAN: 52 mL/min/{1.73_m2} — AB (ref >=60)
GLOBULIN: 3.8 g/dL — AB (ref 1.9–3.7)
Glucose, Bld: 585 mg/dL (ref 65–99)
POTASSIUM: 4.9 mmol/L (ref 3.5–5.3)
SODIUM: 127 mmol/L — AB (ref 135–146)
TOTAL PROTEIN: 8.6 g/dL — AB (ref 6.1–8.1)
Total Bilirubin: 0.4 mg/dL (ref 0.2–1.2)

## 2017-04-27 LAB — VITAMIN D 25 HYDROXY (VIT D DEFICIENCY, FRACTURES): Vit D, 25-Hydroxy: 17 ng/mL — ABNORMAL LOW (ref 30–100)

## 2017-04-27 LAB — LIPID PANEL
CHOL/HDL RATIO: 6.8 (calc) — AB (ref <5.0)
Cholesterol: 312 mg/dL — ABNORMAL HIGH (ref <200)
HDL: 46 mg/dL — AB (ref >50)
Non-HDL Cholesterol (Calc): 266 mg/dL (calc) — ABNORMAL HIGH (ref <130)
Triglycerides: 517 mg/dL — ABNORMAL HIGH (ref <150)

## 2017-04-27 LAB — HEMOGLOBIN A1C

## 2017-04-27 LAB — TSH: TSH: 1.21 mIU/L

## 2017-05-06 ENCOUNTER — Telehealth: Payer: Self-pay | Admitting: Family Medicine
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
## 2017-05-07 MED ORDER — VITAMIN D (ERGOCALCIFEROL) 1.25 MG (50000 UNIT) PO CAPS: 50000.0000 [IU] | ORAL_CAPSULE | ORAL | 1 refills | Status: DC

## 2017-05-07 NOTE — Telephone Encounter (Signed)
I tried one more time to talk with her; reached voicemail again Left message that I'm glad to hear that her sugars are coming down back on the insulin Please call me here at the office if sugars are not under 200 and we can adjust her insulin more before her appt with endo Vit D is low, so I'm sending new Rx to pharmacy to help with that

## 2017-06-20 ENCOUNTER — Telehealth: Payer: Self-pay | Admitting: Family Medicine

## 2017-06-20 NOTE — Telephone Encounter (Signed)
Dr.Lada have you seen this form? If not I can call and request another copy. Thanks!

## 2017-06-20 NOTE — Telephone Encounter (Signed)
Copied from [place] [id number]. Topic: Quick Communication - See Telephone Encounter >> Jun 20, 2017 12:18 PM Robina Ade, Helene Kelp D wrote: CRM for notification. See Telephone encounter for: 06/20/17. Tina from [place] called and wanted to know if Dr. Sanda Klein has received and was able to fax back a depression screening questionnaire? Please call Otila Kluver back at [phone] ok to leave vm.

## 2017-06-25 NOTE — Telephone Encounter (Signed)
The form that was sent really needs to go to the patient's psychiatrist; I filled out the form that was more medically oriented in October; I gave the form back to Cassandra to fax back and they can have her psychiatrist fill this out

## 2017-07-26 ENCOUNTER — Ambulatory Visit: Payer: Medicaid Other | Admitting: Family Medicine

## 2017-08-14 ENCOUNTER — Other Ambulatory Visit: Payer: Self-pay

## 2017-08-14 ENCOUNTER — Emergency Department: Payer: Medicaid Other

## 2017-08-14 ENCOUNTER — Emergency Department
Admission: EM | Admit: 2017-08-14 | Discharge: 2017-08-14 | Disposition: A | Discharge status: Home / Self Care | Payer: Medicaid Other | Attending: Emergency Medicine | Admitting: Emergency Medicine

## 2017-08-14 ENCOUNTER — Encounter: Payer: Self-pay | Admitting: Emergency Medicine

## 2017-08-14 DIAGNOSIS — Y9389 Activity, other specified: Secondary | ICD-10-CM | POA: Diagnosis not present

## 2017-08-14 DIAGNOSIS — Y999 Unspecified external cause status: Secondary | ICD-10-CM | POA: Diagnosis not present

## 2017-08-14 DIAGNOSIS — E119 Type 2 diabetes mellitus without complications: Secondary | ICD-10-CM | POA: Insufficient documentation

## 2017-08-14 DIAGNOSIS — S161XXA Strain of muscle, fascia and tendon at neck level, initial encounter: Secondary | ICD-10-CM | POA: Insufficient documentation

## 2017-08-14 DIAGNOSIS — Z79899 Other long term (current) drug therapy: Secondary | ICD-10-CM | POA: Diagnosis not present

## 2017-08-14 DIAGNOSIS — Y92410 Unspecified street and highway as the place of occurrence of the external cause: Secondary | ICD-10-CM | POA: Insufficient documentation

## 2017-08-14 DIAGNOSIS — F17210 Nicotine dependence, cigarettes, uncomplicated: Secondary | ICD-10-CM | POA: Diagnosis not present

## 2017-08-14 DIAGNOSIS — I10 Essential (primary) hypertension: Secondary | ICD-10-CM | POA: Insufficient documentation

## 2017-08-14 DIAGNOSIS — Z794 Long term (current) use of insulin: Secondary | ICD-10-CM | POA: Insufficient documentation

## 2017-08-14 DIAGNOSIS — S199XXA Unspecified injury of neck, initial encounter: Secondary | ICD-10-CM | POA: Diagnosis present

## 2017-08-14 MED ORDER — IBUPROFEN 600 MG PO TABS: 600.0000 mg | ORAL_TABLET | Freq: Three times a day (TID) | ORAL | 0 refills | Status: DC | PRN

## 2017-08-14 MED ORDER — TRAMADOL HCL 50 MG PO TABS: 50.0000 mg | ORAL_TABLET | Freq: Four times a day (QID) | ORAL | 0 refills | Status: AC | PRN

## 2017-08-14 MED ORDER — CYCLOBENZAPRINE HCL 10 MG PO TABS: 10.0000 mg | ORAL_TABLET | Freq: Three times a day (TID) | ORAL | 0 refills | Status: DC | PRN

## 2017-08-14 NOTE — ED Notes (Signed)
NAD noted at time of D/C. Pt denies questions or concerns. Pt ambulatory to the lobby at this time.  
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
## 2017-08-16 ENCOUNTER — Ambulatory Visit (INDEPENDENT_AMBULATORY_CARE_PROVIDER_SITE_OTHER): Payer: Medicaid Other | Admitting: Family Medicine

## 2017-08-16 ENCOUNTER — Encounter: Payer: Self-pay | Admitting: Family Medicine

## 2017-08-16 VITALS — BP 118/84 | HR 94 | Temp 98.3°F | Resp 16 | Wt 157.6 lb

## 2017-08-16 DIAGNOSIS — I10 Essential (primary) hypertension: Secondary | ICD-10-CM

## 2017-08-16 DIAGNOSIS — Z5181 Encounter for therapeutic drug level monitoring: Secondary | ICD-10-CM

## 2017-08-16 DIAGNOSIS — M25511 Pain in right shoulder: Secondary | ICD-10-CM | POA: Diagnosis not present

## 2017-08-16 DIAGNOSIS — M25512 Pain in left shoulder: Secondary | ICD-10-CM | POA: Diagnosis not present

## 2017-08-16 DIAGNOSIS — E1365 Other specified diabetes mellitus with hyperglycemia: Secondary | ICD-10-CM

## 2017-08-16 DIAGNOSIS — E1065 Type 1 diabetes mellitus with hyperglycemia: Secondary | ICD-10-CM

## 2017-08-16 DIAGNOSIS — F331 Major depressive disorder, recurrent, moderate: Secondary | ICD-10-CM | POA: Diagnosis not present

## 2017-08-16 DIAGNOSIS — E559 Vitamin D deficiency, unspecified: Secondary | ICD-10-CM

## 2017-08-16 DIAGNOSIS — E785 Hyperlipidemia, unspecified: Secondary | ICD-10-CM | POA: Diagnosis not present

## 2017-08-16 DIAGNOSIS — IMO0002 Reserved for concepts with insufficient information to code with codable children: Secondary | ICD-10-CM

## 2017-08-16 LAB — POCT GLYCOSYLATED HEMOGLOBIN (HGB A1C): Hemoglobin A1C: >14

## 2017-08-16 MED ORDER — INSULIN ASPART 100 UNIT/ML FLEXPEN: 18.0000 [IU] | PEN_INJECTOR | Freq: Three times a day (TID) | SUBCUTANEOUS | 6 refills | Status: DC

## 2017-08-16 MED ORDER — INSULIN DETEMIR 100 UNIT/ML FLEXPEN: 55.0000 [IU] | PEN_INJECTOR | Freq: Every evening | SUBCUTANEOUS | 6 refills | Status: AC
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
## 2017-08-16 NOTE — Assessment & Plan Note (Signed)
Working with therapist, seeing psychiatrist; feels medicines are enough

## 2017-08-16 NOTE — Assessment & Plan Note (Signed)
Excellent blood pressure

## 2017-08-16 NOTE — Assessment & Plan Note (Signed)
Check lipids today; fasting

## 2017-08-16 NOTE — Assessment & Plan Note (Signed)
Check liver and kidneys 

## 2017-08-16 NOTE — Patient Instructions (Signed)
Increase insulin to 55 units once a day (Levemir) Increase insulin to 18 units with meals (Novolog) We'll get labs today If you have not heard anything from my staff in a week about any orders/referrals/studies from today, please contact us here to follow-up [phone]

## 2017-08-16 NOTE — Assessment & Plan Note (Signed)
Check level and supplement if needed 

## 2017-08-18 ENCOUNTER — Other Ambulatory Visit: Payer: Self-pay | Admitting: Family Medicine

## 2017-08-18 MED ORDER — VITAMIN D (ERGOCALCIFEROL) 1.25 MG (50000 UNIT) PO CAPS: 50000.0000 [IU] | ORAL_CAPSULE | ORAL | 1 refills | Status: AC

## 2017-08-18 NOTE — Progress Notes (Signed)
Rx for vit D 

## 2017-08-19 ENCOUNTER — Other Ambulatory Visit: Payer: Self-pay | Admitting: Family Medicine

## 2017-08-19 DIAGNOSIS — R768 Other specified abnormal immunological findings in serum: Secondary | ICD-10-CM | POA: Insufficient documentation

## 2017-08-19 LAB — SEDIMENTATION RATE: SED RATE: 22 mm/h (ref 0–30)

## 2017-08-19 LAB — COMPLETE METABOLIC PANEL WITH GFR
AG RATIO: 1.3 (calc) (ref 1.0–2.5)
ALKALINE PHOSPHATASE (APISO): 116 U/L (ref 33–130)
ALT: 13 U/L (ref 6–29)
AST: 6 U/L — ABNORMAL LOW (ref 10–35)
Albumin: 4.5 g/dL (ref 3.6–5.1)
BILIRUBIN TOTAL: 0.3 mg/dL (ref 0.2–1.2)
BUN: 12 mg/dL (ref 7–25)
CHLORIDE: 99 mmol/L (ref 98–110)
CO2: 29 mmol/L (ref 20–32)
Calcium: 10.3 mg/dL (ref 8.6–10.4)
Creat: 0.71 mg/dL (ref 0.50–1.05)
GFR, Est African American: 110 mL/min/{1.73_m2} (ref >=60)
GFR, Est Non African American: 95 mL/min/{1.73_m2} (ref >=60)
GLOBULIN: 3.4 g/dL (ref 1.9–3.7)
Glucose, Bld: 224 mg/dL — ABNORMAL HIGH (ref 65–99)
POTASSIUM: 4.8 mmol/L (ref 3.5–5.3)
SODIUM: 136 mmol/L (ref 135–146)
Total Protein: 7.9 g/dL (ref 6.1–8.1)

## 2017-08-19 LAB — LIPID PANEL
CHOLESTEROL: 254 mg/dL — AB (ref <200)
HDL: 71 mg/dL (ref >50)
LDL CHOLESTEROL (CALC): 161 mg/dL — AB
Non-HDL Cholesterol (Calc): 183 mg/dL (calc) — ABNORMAL HIGH (ref <130)
TRIGLYCERIDES: 106 mg/dL (ref <150)
Total CHOL/HDL Ratio: 3.6 (calc) (ref <5.0)

## 2017-08-19 LAB — ANA,IFA RA DIAG PNL W/RFLX TIT/PATN
Anti Nuclear Antibody(ANA): NEGATIVE
Cyclic Citrullin Peptide Ab: <16 UNITS
Rhuematoid fact SerPl-aCnc: 17 IU/mL — ABNORMAL HIGH (ref <14)

## 2017-08-19 LAB — MICROALBUMIN / CREATININE URINE RATIO
Creatinine, Urine: 76 mg/dL (ref 20–275)
Microalb Creat Ratio: 320 mcg/mg creat — ABNORMAL HIGH (ref <30)
Microalb, Ur: 24.3 mg/dL

## 2017-08-19 LAB — HEMOGLOBIN A1C: Hgb A1c MFr Bld: >14 % of total Hgb — ABNORMAL HIGH (ref <5.7)

## 2017-08-19 LAB — C-REACTIVE PROTEIN: CRP: 3 mg/L (ref <8.0)

## 2017-08-19 LAB — VITAMIN D 25 HYDROXY (VIT D DEFICIENCY, FRACTURES): VIT D 25 HYDROXY: 23 ng/mL — AB (ref 30–100)

## 2017-08-19 NOTE — Assessment & Plan Note (Signed)
Refer to rheum 

## 2017-08-19 NOTE — Progress Notes (Signed)
Refer to rheum 

## 2017-08-21 NOTE — Assessment & Plan Note (Signed)
Check A1c; get back on medicines; she does not wish to see endocrinologist when suggested; wishes it to be treated here; explained I am not an endocrinologist; she does not want to go to multiple doctors, citing her anxiety and mood disorder

## 2017-08-28 ENCOUNTER — Telehealth: Payer: Self-pay | Admitting: Family Medicine

## 2017-08-28 NOTE — Telephone Encounter (Signed)
Copied from [place]. Topic: Quick Communication - See Telephone Encounter >> Aug 28, 2017  1:47 PM Bea Graff, NT wrote: CRM for notification. See Telephone encounter for: Pt states when she was seen on 08/16/17 that Dr. Sanda Klein was going to call something in for her arthritis. Pharmacy has told pt they have not received anything for her arthritis. Please advise  08/28/17.

## 2017-08-28 NOTE — Telephone Encounter (Signed)
Please advise. Thanks.  

## 2017-08-29 MED ORDER — IBUPROFEN 600 MG PO TABS: 600.0000 mg | ORAL_TABLET | Freq: Three times a day (TID) | ORAL | 0 refills | Status: DC | PRN

## 2017-08-29 NOTE — Telephone Encounter (Signed)
Called pt informed her of the need to go pick up motrin, pt gave verbal understanding.

## 2017-08-29 NOTE — Telephone Encounter (Signed)
I sent in a refill of the ibuprofen which she was given in the ER by PA Tamala Julian I hope that helps Hopefully, the rheumatologist will see her and will probably start her on other medicine to help control her pain very soon Thank you

## 2017-09-10 DIAGNOSIS — M7551 Bursitis of right shoulder: Secondary | ICD-10-CM | POA: Insufficient documentation

## 2017-09-10 DIAGNOSIS — R768 Other specified abnormal immunological findings in serum: Secondary | ICD-10-CM | POA: Insufficient documentation

## 2017-09-10 DIAGNOSIS — M7552 Bursitis of left shoulder: Secondary | ICD-10-CM | POA: Insufficient documentation

## 2017-09-10 DIAGNOSIS — M25512 Pain in left shoulder: Secondary | ICD-10-CM

## 2017-09-10 DIAGNOSIS — G8929 Other chronic pain: Secondary | ICD-10-CM | POA: Insufficient documentation

## 2017-09-10 DIAGNOSIS — M25511 Pain in right shoulder: Secondary | ICD-10-CM

## 2017-09-13 ENCOUNTER — Ambulatory Visit: Payer: Medicaid Other | Admitting: Family Medicine

## 2017-09-17 ENCOUNTER — Ambulatory Visit: Payer: Medicaid Other | Admitting: Family Medicine

## 2017-09-23 ENCOUNTER — Ambulatory Visit: Payer: Medicaid Other | Admitting: Family Medicine

## 2017-09-24 DIAGNOSIS — M542 Cervicalgia: Secondary | ICD-10-CM | POA: Insufficient documentation

## 2017-09-24 NOTE — Progress Notes (Signed)
Signing off on old abstraction from 2018

## 2017-10-01 ENCOUNTER — Encounter: Payer: Self-pay | Admitting: Family Medicine

## 2017-10-01 ENCOUNTER — Ambulatory Visit (INDEPENDENT_AMBULATORY_CARE_PROVIDER_SITE_OTHER): Payer: Medicaid Other | Admitting: Family Medicine

## 2017-10-01 VITALS — BP 136/80 | HR 100 | Temp 98.4°F | Resp 16 | Wt 156.0 lb

## 2017-10-01 DIAGNOSIS — M7552 Bursitis of left shoulder: Secondary | ICD-10-CM

## 2017-10-01 DIAGNOSIS — E1065 Type 1 diabetes mellitus with hyperglycemia: Secondary | ICD-10-CM | POA: Diagnosis not present

## 2017-10-01 DIAGNOSIS — E109 Type 1 diabetes mellitus without complications: Secondary | ICD-10-CM | POA: Diagnosis not present

## 2017-10-01 DIAGNOSIS — R768 Other specified abnormal immunological findings in serum: Secondary | ICD-10-CM | POA: Diagnosis not present

## 2017-10-01 DIAGNOSIS — M7551 Bursitis of right shoulder: Secondary | ICD-10-CM | POA: Diagnosis not present

## 2017-10-01 DIAGNOSIS — R079 Chest pain, unspecified: Secondary | ICD-10-CM | POA: Diagnosis not present

## 2017-10-01 DIAGNOSIS — E139 Other specified diabetes mellitus without complications: Secondary | ICD-10-CM

## 2017-10-01 DIAGNOSIS — E1365 Other specified diabetes mellitus with hyperglycemia: Secondary | ICD-10-CM

## 2017-10-01 DIAGNOSIS — IMO0002 Reserved for concepts with insufficient information to code with codable children: Secondary | ICD-10-CM

## 2017-10-01 MED ORDER — INSULIN ASPART 100 UNIT/ML FLEXPEN: 20.0000 [IU] | PEN_INJECTOR | Freq: Three times a day (TID) | SUBCUTANEOUS | 5 refills | Status: AC

## 2017-10-01 MED ORDER — ATORVASTATIN CALCIUM 80 MG PO TABS: 80.0000 mg | ORAL_TABLET | Freq: Every day | ORAL | 5 refills | Status: AC

## 2017-10-01 MED ORDER — ASPIRIN EC 81 MG PO TBEC: 81.0000 mg | DELAYED_RELEASE_TABLET | Freq: Every day | ORAL | 11 refills | Status: AC

## 2017-10-01 MED ORDER — AMLODIPINE BESYLATE 5 MG PO TABS: 5.0000 mg | ORAL_TABLET | Freq: Every morning | ORAL | 11 refills | Status: AC

## 2017-10-01 MED ORDER — LISINOPRIL 5 MG PO TABS: 5.0000 mg | ORAL_TABLET | Freq: Every day | ORAL | 5 refills | Status: AC

## 2017-10-01 NOTE — Progress Notes (Signed)
BP 136/80   Pulse 100   Temp 98.4 F (36.9 C) (Oral)   Resp 16   Wt 156 lb (70.8 kg)   LMP 04/29/2015 (Approximate)   SpO2 96%   BMI 28.53 kg/m    Subjective:    Patient ID: Allison Cooke, female    DOB: 12-12-1960, 57 y.o.   MRN: [id number]  HPI: Allison Cooke is a 57 y.o. female  Chief Complaint  Patient presents with  . Follow-up  . Medication Refill    HPI Patient is here for f/u  Type 2 diabetes, or perhaps even 1.5; just on insulin right now; sugars are "okay"; checking FSBS, sometimes high and sometimes low; lowest 140; highest in the last 2 weeks: 400 something Lab Results  Component Value Date   HGBA1C >14.0 (H) 08/16/2017   She was having an episode of chest tightness and into the left arm; lasted two months, came and went; no episodes for a few weeks; would like heart checked out; had SHOB; no nausea; had some sweating; no precipitating factors; no alleviating factors; no known fam hx of heart attacks; tighterning is the most common description she used; "hurt up the arm real bad"  Breathing doing okay; using inhaler at times  Saw rheum; had labs and xrays; had injections in both shoulders; saw them 2/5 and then back on 2/19 Gave her medrol dosepak and flexeril RHEUMATOLOGIC PROBLEM LIST 1. Neck pain, musculoskeletal  2. Subacromial bursitis of right shoulder joint  3. Subacromial bursitis of left shoulder joint  4. Elevated rheumatoid factor   PT needed for chronic shoulder pain, myofascial neck pain Flexeril and f/u in 3 months    Depression screen [facility] 2/9 08/16/2017 04/26/2017 01/14/2017 10/04/2016 06/25/2016  Decreased Interest 0 1 0 0 1  Down, Depressed, Hopeless _0 PHQ - 2 Score _1 Altered sleeping - 3 - - 1  Tired, decreased energy - 3 - - 1  Change in appetite - 3 - - 1  Feeling bad or failure about yourself  - 3 - - 1  Trouble concentrating - 3 - - 1  Moving slowly or fidgety/restless - 0 - - 1  Suicidal thoughts - 1 -  - 1  PHQ-9 Score - 19 - - 9  Difficult doing work/chores - Very difficult - - Not difficult at all    Relevant past medical, surgical, family and social history reviewed Past Medical History:  Diagnosis Date  . Anemia   . Arthritis   . Chronic post-traumatic stress disorder (PTSD) 01/29/2016  . Depression   . Diabetes mellitus without complication ([place])   . Diabetes, type 1.5, uncontrolled, managed as type 1 ([place]) 06/28/2015  . Headache   . Heart murmur   . High cholesterol   . Hypertension   . Lumbar back pain 02/03/2016  . Seizures ([place])    as a child  . SOB (shortness of breath)    Past Surgical History:  Procedure Laterality Date  . BREAST EXCISIONAL BIOPSY Left 2016   NEG  . BREAST LUMPECTOMY Left 07/14/2015   Procedure: BREAST LUMPECTOMY;  Surgeon: Christene Lye, MD;  Location: [facility] ORS;  Service: General;  Laterality: Left;  . BREAST SURGERY Left   . CESAREAN SECTION    . CHOLECYSTECTOMY    . COLONOSCOPY WITH PROPOFOL N/A 03/06/2016   Procedure: COLONOSCOPY WITH PROPOFOL;  Surgeon: [name], MD;  Location: [facility] ENDOSCOPY;  Service: Endoscopy;  Laterality: N/A;  . DIRECT LARYNGOSCOPY N/A 04/26/2015   Procedure: Microlaryngoscopy with exxcision of vocal cord polyp ;  Surgeon: Beverly Gust, MD;  Location: [facility] ORS;  Service: ENT;  Laterality: N/A;  . FINE NEEDLE ASPIRATION Left    breast cyst, benign, age 44?   Family History  Problem Relation Age of Onset  . Hypertension Mother   . Heart disease Mother   . Hyperlipidemia Mother   . Diabetes Mother   . Heart disease Father   . Hyperlipidemia Father   . Hypertension Father   . Diabetes Sister   . Hypertension Daughter   . Diabetes Maternal Grandmother   . Diabetes Sister   . Heart failure Sister   . Cancer Neg Hx   . Stroke Neg Hx   . COPD Neg Hx   . Breast cancer Neg Hx    Social History   Tobacco Use  . Smoking status: Current Some Day Smoker    Packs/day: 0.25    Years: 20.00    Pack  years: 5.00    Types: Cigarettes  . Smokeless tobacco: Never Used  Substance Use Topics  . Alcohol use: No  . Drug use: No    Interim medical history since last visit reviewed. Allergies and medications reviewed  Review of Systems  Constitutional: Negative for unexpected weight change.  Respiratory: Positive for shortness of breath.   Cardiovascular: Positive for chest pain. Negative for palpitations.  Endocrine: Positive for polydipsia and polyuria.  Hematological: Does not bruise/bleed easily (not now).   Per HPI unless specifically indicated above     Objective:    BP 136/80   Pulse 100   Temp 98.4 F (36.9 C) (Oral)   Resp 16   Wt 156 lb (70.8 kg)   LMP 04/29/2015 (Approximate)   SpO2 96%   BMI 28.53 kg/m   Wt Readings from Last 3 Encounters:  10/01/17 156 lb (70.8 kg)  08/16/17 157 lb 9.6 oz (71.5 kg)  08/14/17 163 lb (73.9 kg)    Physical Exam  Constitutional: She appears well-developed and well-nourished. No distress.  HENT:  Head: Normocephalic and atraumatic.  Eyes: EOM are normal. No scleral icterus.  Neck: No thyromegaly present.  Cardiovascular: Normal rate, regular rhythm and normal heart sounds.  No murmur heard. Pulmonary/Chest: Effort normal and breath sounds normal. No respiratory distress. She has no wheezes.  Abdominal: Soft. She exhibits no distension.  Truncal obesity  Musculoskeletal: Normal range of motion. She exhibits no edema.  Neurological: She is alert. She exhibits normal muscle tone.  Skin: Skin is warm and dry. She is not diaphoretic. No pallor.  Psychiatric:  Good eye contact with examiner; not tearful today   Diabetic Foot Form - Detailed   Diabetic Foot Exam - detailed Diabetic Foot exam was performed with the following findings:  Yes 10/01/2017  9:34 AM  Visual Foot Exam completed.:  Yes  Pulse Foot Exam completed.:  Yes  Right Dorsalis Pedis:  Present Left Dorsalis Pedis:  Present  Sensory Foot Exam Completed.:   Yes Semmes-Weinstein Monofilament Test R Site 1-Great Toe:  Pos L Site 1-Great Toe:  Pos        Results for orders placed or performed in visit on 08/16/17  C-reactive protein  Result Value Ref Range   CRP 3.0 <8.0 mg/L  Sed Rate (ESR)  Result Value Ref Range   Sed Rate 22 0 - 30 mm/h  ANA,IFA RA Diag Pnl w/rflx Tit/Patn  Result Value Ref Range  Anit Nuclear Antibody(ANA) NEGATIVE NEGATIVE   Rhuematoid fact SerPl-aCnc 17 (H) <71 IU/mL   Cyclic Citrullin Peptide Ab <16 UNITS   INTERPRETATION    Microalbumin / creatinine urine ratio  Result Value Ref Range   Creatinine, Urine 76 20 - 275 mg/dL   Microalb, Ur 24.3 mg/dL   Microalb Creat Ratio 320 (H) <30 mcg/mg creat  Lipid panel  Result Value Ref Range   Cholesterol 254 (H) <200 mg/dL   HDL 71 >50 mg/dL   Triglycerides 106 <150 mg/dL   LDL Cholesterol (Calc) 161 (H) mg/dL (calc)   Total CHOL/HDL Ratio 3.6 <5.0 (calc)   Non-HDL Cholesterol (Calc) 183 (H) <130 mg/dL (calc)  Hemoglobin A1c  Result Value Ref Range   Hgb A1c MFr Bld >14.0 (H) <5.7 % of total Hgb   Mean Plasma Glucose  (calc)  COMPLETE METABOLIC PANEL WITH GFR  Result Value Ref Range   Glucose, Bld 224 (H) 65 - 99 mg/dL   BUN 12 7 - 25 mg/dL   Creat 0.71 0.50 - 1.05 mg/dL   GFR, Est Non African American 95 > OR = 60 mL/min/1.39m   GFR, Est African American 110 > OR = 60 mL/min/1.725m  BUN/Creatinine Ratio NOT APPLICABLE 6 - 22 (calc)   Sodium 136 135 - 146 mmol/L   Potassium 4.8 3.5 - 5.3 mmol/L   Chloride 99 98 - 110 mmol/L   CO2 29 20 - 32 mmol/L   Calcium 10.3 8.6 - 10.4 mg/dL   Total Protein 7.9 6.1 - 8.1 g/dL   Albumin 4.5 3.6 - 5.1 g/dL   Globulin 3.4 1.9 - 3.7 g/dL (calc)   AG Ratio 1.3 1.0 - 2.5 (calc)   Total Bilirubin 0.3 0.2 - 1.2 mg/dL   Alkaline phosphatase (APISO) 116 33 - 130 U/L   AST 6 (L) 10 - 35 U/L   ALT 13 6 - 29 U/L  VITAMIN D 25 Hydroxy (Vit-D Deficiency, Fractures)  Result Value Ref Range   Vit D, 25-Hydroxy 23 (L) 30 -  100 ng/mL  POCT HgB A1C  Result Value Ref Range   Hemoglobin A1C >14.0       Assessment & Plan:   Problem List Items Addressed This Visit      Endocrine   Diabetes mellitus type 1.5, managed as type 1 (HCC)    Increase the insulin, check A1c in April; foot exam by MD      Relevant Medications   atorvastatin (LIPITOR) 80 MG tablet   insulin aspart (NOVOLOG) 100 UNIT/ML FlexPen   lisinopril (PRINIVIL,ZESTRIL) 5 MG tablet   aspirin EC 81 MG tablet     Musculoskeletal and Integument   Subacromial bursitis of right shoulder joint    S/p injection, likely increasing glucose; increase insulin; recheck A1c in april      Subacromial bursitis of left shoulder joint    S/p injection, likely increasing glucose; increase insulin; recheck A1c in april        Other   Rheumatoid factor positive    Managed by rheum       Other Visit Diagnoses    Chest pain, unspecified type    -  Primary   EKG done; start aspirin; refer to cardiologist, high priority; call 911, go to ER if chest pain returns   Relevant Orders   Ambulatory referral to Cardiology   EKG 12-Lead       Follow up plan: Return in about 6 weeks (around 11/15/2017) for follow-up visit with  Dr. Sanda Klein (April 12th or later).  An after-visit summary was printed and given to the patient at [place].  Please see the patient instructions which may contain other information and recommendations beyond what is mentioned above in the assessment and plan.  Meds ordered this encounter  Medications  . amLODipine (NORVASC) 5 MG tablet    Sig: Take 1 tablet (5 mg total) by mouth every morning.    Dispense:  30 tablet    Refill:  11    Maximum Refills Reached: need asap  . atorvastatin (LIPITOR) 80 MG tablet    Sig: Take 1 tablet (80 mg total) by mouth daily. For cholesterol    Dispense:  30 tablet    Refill:  5  . insulin aspart (NOVOLOG) 100 UNIT/ML FlexPen    Sig: Inject 20 Units into the skin 3 (three) times daily before  meals.    Dispense:  5 pen    Refill:  5  . lisinopril (PRINIVIL,ZESTRIL) 5 MG tablet    Sig: Take 1 tablet (5 mg total) by mouth daily.    Dispense:  30 tablet    Refill:  5  . aspirin EC 81 MG tablet    Sig: Take 1 tablet (81 mg total) by mouth daily.    Dispense:  30 tablet    Refill:  11    Orders Placed This Encounter  Procedures  . Ambulatory referral to Cardiology  . EKG 12-Lead

## 2017-10-01 NOTE — Assessment & Plan Note (Signed)
Increase the insulin, check A1c in April; foot exam by MD

## 2017-10-01 NOTE — Assessment & Plan Note (Signed)
S/p injection, likely increasing glucose; increase insulin; recheck A1c in april

## 2017-10-01 NOTE — Assessment & Plan Note (Signed)
Managed by rheum 

## 2017-10-01 NOTE — Patient Instructions (Addendum)
Start the baby coated aspirin once a day We'll have you see the heart doctor   Coronary Artery Disease, Female Coronary artery disease (CAD) is a condition in which the arteries that lead to the heart (coronary arteries) become narrow or blocked. The narrowing or blockage can lead to decreased blood flow to the heart. Prolonged reduced blood flow can cause a heart attack (myocardial infarction or MI). This condition may also be called coronary heart disease. Because CAD is the leading cause of death in women, it is important to understand what causes this condition and how it is treated. What are the causes? CAD is most often caused by atherosclerosis. This is the buildup of fat and cholesterol (plaque) on the inside of the arteries. Over time, the plaque may narrow or block the artery, reducing blood flow to the heart. Plaque can also become weak and break off within a coronary artery and cause a sudden blockage. Other less common causes of CAD include:  An embolism or blood clot in a coronary artery.  A tearing of the artery (spontaneous coronary artery dissection).  An aneurysm.  Inflammation (vasculitis) in the artery wall.  What increases the risk? The following factors may make you more likely to develop this condition:  Age. Women over age 69 are at a greater risk of CAD.  Family history of CAD.  High blood pressure (hypertension).  Diabetes.  High cholesterol levels.  Tobacco use.  Lack of exercise.  Menopause. ? All postmenopausal women are at greater risk of CAD. ? Women who have experienced menopause between the ages of 52-45 (early menopause) are at a higher risk of CAD. ? Women who have experienced menopause before age 36 (premature menopause) are at a very high risk of CAD.  Excessive alcohol use  A diet high in saturated and trans fats, such as fried food and processed meat.  Other possible risk factors include:  High stress  levels.  Depression  Obesity.  Sleep apnea.  What are the signs or symptoms? Many people do not have any symptoms during the early stages of CAD. As the condition progresses, symptoms may include:  Chest pain (angina). The pain can: ? Feel like crushing or squeezing, or a tightness, pressure, fullness, or heaviness in the chest. ? Last more than a few minutes or can stop and recur. The pain tends to get worse with exercise or stress and to fade with rest.  Pain in the arms, neck, jaw, or back.  Unexplained heartburn or indigestion.  Shortness of breath.  Nausea.  Sudden cold sweats.  Sudden light-headedness.  Fluttering or fast heartbeat (palpitations).  Many women have chest discomfort and the other symptoms. However, women often have unusual (atypical) symptoms, such as:  Fatigue.  Vomiting.  Unexplained feelings of nervousness or anxiety.  Unexplained weakness.  Dizziness or fainting.  How is this diagnosed? This condition is diagnosed based on:  Your family and medical history.  A physical exam.  Tests, including: ? A test to check the electrical signals in your heart (electrocardiogram). ? Exercise stress test. This looks for signs of blockage when the heart is stressed with exercise, such as running on a treadmill. ? Pharmacologic stress test. This test looks for signs of blockage when the heart is being stressed with a medicine. ? Blood tests. ? Coronary angiogram. This is a procedure to look at the coronary arteries to see if there is any blockage. During this test, a dye is injected into your arteries so  they appear on an X-ray. ? A test that uses sound waves to take a picture of your heart (echocardiogram). ? Chest X-ray.  How is this treated? This condition may be treated by:  Healthy lifestyle changes to reduce risk factors.  Medicines such as: ? Antiplatelet medicines and blood-thinning medicines, such as aspirin. These help prevent blood  clots. ? Nitroglycerin. ? Blood pressure medicines. ? Cholesterol-lowering medicine.  Coronary angioplasty and stenting. During this procedure, a thin, flexible tube is inserted through a blood vessel and into a blocked artery. A balloon or similar device on the end of the tube is inflated to open up the artery. In some cases, a small, mesh tube (stent) is inserted into the artery to keep it open.  Coronary artery bypass surgery. During this surgery, veins or arteries from other parts of the body are used to create a bypass around the blockage and allow blood to reach your heart.  Follow these instructions at home: Medicines  Take over-the-counter and prescription medicines only as told by your health care provider.  Do not take the following medicines unless your health care provider approves: ? NSAIDs, such as ibuprofen, naproxen, or celecoxib. ? Vitamin supplements that contain vitamin A, vitamin E, or both. ? Hormone replacement therapy that contains estrogen with or without progestin. Lifestyle  Follow an exercise program approved by your health care provider. Aim for 150 minutes of moderate exercise or 75 minutes of vigorous exercise each week.  Maintain a healthy weight or lose weight as approved by your health care provider.  Rest when you are tired.  Learn to manage stress or try to limit your stress. Ask your health care provider for suggestions if you need help.  Get screened for depression and seek treatment, if needed.  Do not use any products that contain nicotine or tobacco, such as cigarettes and e-cigarettes. If you need help quitting, ask your health care provider.  Do not use illegal drugs. Eating and drinking  Follow a heart-healthy diet. A dietitian can help educate you about healthy food options and changes. In general, eat plenty of fruits and vegetables, lean meats, and whole grains.  Avoid foods high in: ? Sugar. ? Salt (sodium). ? Saturated fats, such  as processed or fatty meat. ? Trans fats, such as fried food.  Use healthy cooking methods such as roasting, grilling, broiling, baking, poaching, steaming, or stir-frying.  If you drink alcohol, and your health care provider approves, limit your alcohol intake to no more than 1 drink per day. One drink equals 12 ounces of beer, 5 ounces of wine, or 1 ounces of hard liquor. General instructions  Manage any other health conditions, such as hypertension and diabetes. These conditions affect your heart.  Your health care provider may ask you to monitor your blood pressure. Ideally, your blood pressure should be below 130/80.  Keep all follow-up visits as told by your health care provider. This is important. Get help right away if:  You have pain in your chest, neck, arm, jaw, stomach, or back that: ? Lasts more than a few minutes. ? Is recurring. ? Is not relieved by taking medicine under your tongue (sublingualnitroglycerin).  You have profuse sweating without cause.  You have unexplained: ? Heartburn or indigestion. ? Shortness of breath or difficulty breathing. ? Fluttering or fast heartbeat (palpitations). ? Nausea or vomiting. ? Fatigue. ? Feelings of nervousness or anxiety. ? Weakness. ? Diarrhea.  You have sudden light-headedness or dizziness.  You faint.  You feel like hurting yourself or think about taking your own life. These symptoms may represent a serious problem that is an emergency. Do not wait to see if the symptoms will go away. Get medical help right away. Call your local emergency services (911 in the [place] Do not drive yourself to the hospital. Summary  Coronary artery disease (CAD) is a process in which the arteries that lead to the heart (coronary arteries) become narrow or blocked. The narrowing or blockage can lead to a heart attack.  Many women have chest discomfort and other common symptoms of CAD. However, women often have different (atypical)  symptoms, such as fatigue, vomiting, and dizziness or weakness.  CAD can be treated with lifestyle changes, medicines, surgery, or a combination of these treatments. This information is not intended to replace advice given to you by your health care provider. Make sure you discuss any questions you have with your health care provider. Document Released: 10/15/2011 Document Revised: 07/13/2016 Document Reviewed: 07/13/2016 Elsevier Interactive Patient Education  [employer].

## 2017-11-12 ENCOUNTER — Ambulatory Visit: Payer: Medicaid Other | Admitting: Cardiovascular Disease

## 2017-12-06 ENCOUNTER — Other Ambulatory Visit: Payer: Self-pay | Admitting: Family Medicine

## 2017-12-06 MED ORDER — ALBUTEROL SULFATE HFA 108 (90 BASE) MCG/ACT IN AERS: 2.0000 | INHALATION_SPRAY | Freq: Four times a day (QID) | RESPIRATORY_TRACT | 0 refills | Status: DC | PRN

## 2017-12-06 NOTE — Telephone Encounter (Signed)
Copied from [place] [id number]. Topic: Quick Communication - Rx Refill/Question >> Dec 06, 2017 10:14 AM Scherrie Gerlach wrote: Medication: PROVENTIL HFA 108 (90 Base) MCG/ACT inhaler Has the patient contacted their pharmacy? No  it has been a while since refilled  [place], [place] [phone] (Phone) [phone] (Fax) Pt states her allergies are very bad this year and she needs the inhaler

## 2017-12-18 DIAGNOSIS — R079 Chest pain, unspecified: Secondary | ICD-10-CM | POA: Insufficient documentation

## 2017-12-18 NOTE — Progress Notes (Signed)
Cardiology Office Note  Date:  12/19/2017   ID:  Allison Cooke, DOB 11/08/1960, MRN [id number]  PCP:  Arnetha Courser, MD   Chief Complaint  Patient presents with  . OTHER    Chest pain and sob. Meds reviewed verbally with pt.    HPI:  Allison Cooke is a 57 yo woman with PMH of  Diabetes type 2, HBA1C >14 Anemia Hypertension Hyperlipidemia Smokes, 40 yrs Referred by Dr. Sanda Klein for chest pain, stable angina  In early 2019 she reported havingepisodes of chest tightness and into the left arm; Symptoms coming and going, sometimes at rest sometimes with exertion Reports having some shortness of breath with exertion  Denies having family history of coronary disease Vein complaint is significant left arm pain  Previously seen by rheumatology  had labs and xrays; had injections in both shoulders; Gave her medrol dosepak and flexeril Diagnosed with 1. Neck pain, musculoskeletal  2. Subacromial bursitis of right shoulder joint  3. Subacromial bursitis of left shoulder joint  4. 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  PSH:    Past Surgical History:  Procedure Laterality Date  . BREAST EXCISIONAL BIOPSY Left 2016   NEG  . BREAST LUMPECTOMY Left 07/14/2015   Procedure: BREAST LUMPECTOMY;  Surgeon: Christene Lye, MD;  Location: [facility]  ORS;  Service: General;  Laterality: Left;  . BREAST SURGERY Left   . CESAREAN SECTION    . CHOLECYSTECTOMY    . COLONOSCOPY WITH PROPOFOL N/A 03/06/2016   Procedure: COLONOSCOPY WITH PROPOFOL;  Surgeon: [name], MD;  Location: [facility] ENDOSCOPY;  Service: Endoscopy;  Laterality: N/A;  . DIRECT LARYNGOSCOPY N/A 04/26/2015   Procedure: Microlaryngoscopy with exxcision of vocal cord polyp ;  Surgeon: Beverly Gust, MD;  Location: [facility] ORS;  Service: ENT;  Laterality: N/A;  . FINE NEEDLE ASPIRATION Left    breast cyst, benign, age 81?    Current Outpatient Medications  Medication Sig Dispense Refill  . albuterol (PROVENTIL HFA) 108 (90 Base) MCG/ACT inhaler Inhale 2 puffs into the lungs every 6 (six) hours as needed for wheezing or shortness of breath. 6.7 g 0  . amLODipine (NORVASC) 5 MG tablet Take 1 tablet (5 mg total) by mouth every morning. 30 tablet 11  . ARIPiprazole (ABILIFY) 15 MG tablet Take 15 mg by mouth daily.    Marland Kitchen aspirin EC 81 MG tablet Take 1 tablet (81 mg total) by mouth daily. 30 tablet 11  . atorvastatin (LIPITOR) 80 MG tablet Take 1 tablet (80 mg total) by mouth daily. For cholesterol 30 tablet 5  . Crisaborole (EUCRISA) 2 % OINT Apply 1 each topically 2 (two) times daily. (Patient taking differently: Apply 1 each topically as needed. ) 60 g 5  . cyclobenzaprine (FLEXERIL) 10 MG tablet Take 1 tablet (10 mg total) by mouth 3 (three) times daily as needed. 15 tablet 0  . Elastic Bandages & Supports (  FUTURO LEFT HAND WRIST BRACE) MISC Wear as often as possible for one month for carpal tunnel syndrome 1 each 0  . escitalopram (LEXAPRO) 10 MG tablet Take 10 mg by mouth daily.    Marland Kitchen glucose blood (ACCU-CHEK AVIVA) test strip Check fingerstick blood sugars three times a day; E11.65, LON 99 months; insulin-dependent; Accu-check Aviva Plus strips 100 each 12  . haloperidol (HALDOL) 5 MG tablet Take 5 mg by mouth at bedtime.    . insulin aspart (NOVOLOG) 100 UNIT/ML FlexPen Inject 20  Units into the skin 3 (three) times daily before meals. 5 pen 5  . Insulin Detemir (LEVEMIR) 100 UNIT/ML Pen Inject 55 Units into the skin Nightly. 5 pen 6  . Lancets (ONETOUCH ULTRASOFT) lancets Use as instructed 100 each 12  . lisinopril (PRINIVIL,ZESTRIL) 5 MG tablet Take 1 tablet (5 mg total) by mouth daily. 30 tablet 5  . montelukast (SINGULAIR) 10 MG tablet Take 1 tablet (10 mg total) by mouth at bedtime. For allergies and eczema 30 tablet 11  . QUEtiapine (SEROQUEL) 25 MG tablet Take 25 mg by mouth daily.    . traMADol (ULTRAM) 50 MG tablet Take 1 tablet (50 mg total) by mouth every 6 (six) hours as needed. 20 tablet 0  . traZODone (DESYREL) 150 MG tablet Take by mouth at bedtime.     No current facility-administered medications for this visit.    Facility-Administered Medications Ordered in Other Visits  Medication Dose Route Frequency Provider Last Rate Last Dose  . albuterol (PROVENTIL) (2.5 MG/3ML) 0.083% nebulizer solution 2.5 mg  2.5 mg Nebulization Once Lada, Satira Anis, MD         Allergies:   Shrimp [shellfish allergy]   Social History:  The patient  reports that she has been smoking cigarettes.  She has a 10.00 pack-year smoking history. She has never used smokeless tobacco. She reports that she does not drink alcohol or use drugs.   Family History:   family history includes Diabetes in her maternal grandmother, mother, sister, and sister; Heart disease in her father and mother; Heart failure in her sister; Hyperlipidemia in her father and mother; Hypertension in her daughter, father, and mother.    Review of Systems: Review of Systems  Constitutional: Negative.   Respiratory: Positive for shortness of breath.   Cardiovascular: Positive for chest pain.  Gastrointestinal: Negative.   Musculoskeletal: Positive for joint pain.  Neurological: Negative.   Psychiatric/Behavioral: Negative.   All other systems reviewed and are negative.    PHYSICAL EXAM: VS:  BP (!)  145/93 (BP Location: Right Arm, Patient Position: Sitting, Cuff Size: Normal)   Pulse 72   Ht 5\' 2"  (1.575 m)   Wt 150 lb 8 oz (68.3 kg)   LMP 04/29/2015 (Approximate)   BMI 27.53 kg/m  , BMI Body mass index is 27.53 kg/m. GEN: Well nourished, well developed, in no acute distress  HEENT: normal  Neck: no JVD, carotid bruits, or masses Cardiac: RRR; no murmurs, rubs, or gallops,no edema  Respiratory:  clear to auscultation bilaterally, normal work of breathing GI: soft, nontender, nondistended, + BS MS: no deformity or atrophy  Skin: warm and dry, no rash Neuro:  Strength and sensation are intact Psych: euthymic mood, full affect    Recent Labs: 04/26/2017: TSH 1.21 08/16/2017: ALT 13; BUN 12; Creat 0.71; Potassium 4.8; Sodium 136    Lipid Panel Lab Results  Component Value Date   CHOL 254 (H) 08/16/2017   HDL 71 08/16/2017  LDLCALC 161 (H) 08/16/2017   TRIG 106 08/16/2017      Wt Readings from Last 3 Encounters:  12/19/17 150 lb 8 oz (68.3 kg)  10/01/17 156 lb (70.8 kg)  08/16/17 157 lb 9.6 oz (71.5 kg)       ASSESSMENT AND PLAN:  Diabetes mellitus type 1.5, managed as type 1 ([place]) Poor control diabetes Reports she is taking her insulin appropriately Would need to confer medication compliance Dietary guide provided with her  Chest pain, unspecified type - Plan: EKG 12-Lead, NM Myocar Multi W/Spect W/Wall Motion / EF Some typical and atypical features Severe risk factors for coronary disease including poorly controlled diabetes, hyperlipidemia, smoker Given chest pain symptoms, unable to treadmill, we have ordered pharmacologic Myoview  Tobacco abuse We have encouraged her to continue to work on weaning her cigarettes and smoking cessation. She will continue to work on this and does not want any assistance with chantix.   Essential hypertension, benign Blood pressure mildly elevated Possibly secondary to being anxious today Recommended she monitor blood  pressure at home If needed amlodipine dose could be increased  Chronic pain of both shoulders Rheumatologic issues as detailed above  Hyperlipidemia Stressed importance of compliance with her Lipitor Could add Zetia if numbers continue to run high  Disposition:   F/U  As needed We will call her with the results of her stress test  Long discussion concerning her risk factors, need for aggressive change in her diabetes, cholesterol, smoking  Total encounter time more than 60 minutes  Greater than 50% was spent in counseling and coordination of care with the patient    Orders Placed This Encounter  Procedures  . NM Myocar Multi W/Spect W/Wall Motion / EF  . EKG 12-Lead     Signed, Esmond Plants, M.D., Ph.D. 12/19/2017  [place], [place]

## 2017-12-19 ENCOUNTER — Encounter

## 2017-12-19 ENCOUNTER — Encounter: Payer: Self-pay | Admitting: Cardiovascular Disease

## 2017-12-19 ENCOUNTER — Ambulatory Visit (INDEPENDENT_AMBULATORY_CARE_PROVIDER_SITE_OTHER): Payer: Medicaid Other | Admitting: Cardiovascular Disease

## 2017-12-19 VITALS — BP 145/93 | HR 72 | Ht 62.0 in | Wt 150.5 lb

## 2017-12-19 DIAGNOSIS — E139 Other specified diabetes mellitus without complications: Secondary | ICD-10-CM

## 2017-12-19 DIAGNOSIS — I10 Essential (primary) hypertension: Secondary | ICD-10-CM | POA: Diagnosis not present

## 2017-12-19 DIAGNOSIS — M25512 Pain in left shoulder: Secondary | ICD-10-CM

## 2017-12-19 DIAGNOSIS — R079 Chest pain, unspecified: Secondary | ICD-10-CM

## 2017-12-19 DIAGNOSIS — M25511 Pain in right shoulder: Secondary | ICD-10-CM

## 2017-12-19 DIAGNOSIS — G8929 Other chronic pain: Secondary | ICD-10-CM | POA: Diagnosis not present

## 2017-12-19 DIAGNOSIS — Z720 Tobacco use: Secondary | ICD-10-CM | POA: Diagnosis not present

## 2017-12-19 NOTE — Patient Instructions (Addendum)
Medication Instructions:   No medication changes made  Labwork:  No new labs needed  Testing/Procedures:  We will order a lexiscan myoview for chest pain/angina Diabetes/smoking/high cholesterol [facility] MYOVIEW  Your caregiver has ordered a Stress Test with nuclear imaging. The purpose of this test is to evaluate the blood supply to your heart muscle. This procedure is referred to as a "Non-Invasive Stress Test." This is because other than having an IV started in your vein, nothing is inserted or "invades" your body. Cardiac stress tests are done to find areas of poor blood flow to the heart by determining the extent of coronary artery disease (CAD). Some patients exercise on a treadmill, which naturally increases the blood flow to your heart, while others who are  unable to walk on a treadmill due to physical limitations have a pharmacologic/chemical stress agent called Lexiscan . This medicine will mimic walking on a treadmill by temporarily increasing your coronary blood flow.   Please note: these test may take anywhere between 2-4 hours to complete  PLEASE REPORT TO [place] TO GO  Date of Procedure:_____________________________________  Arrival Time for Procedure:______________________________  Instructions regarding medication:   _XX___ : Hold diabetes medication morning of procedure and decrease insulin the night before to Levemir 25 units at bedtime.   _XX___:  Hold Amlodipine the morning of procedure    PLEASE NOTIFY THE OFFICE AT LEAST 24 HOURS IN ADVANCE IF YOU ARE UNABLE TO KEEP YOUR APPOINTMENT.  [phone] AND  PLEASE NOTIFY NUCLEAR MEDICINE AT [facility] AT LEAST 24 HOURS IN ADVANCE IF YOU ARE UNABLE TO KEEP YOUR APPOINTMENT. [phone]  How to prepare for your Myoview test:  1. Do not eat or drink after midnight 2. No caffeine for 24 hours prior to test 3. No smoking 24 hours prior to  test. 4. Your medication may be taken with water.  If your doctor stopped a medication because of this test, do not take that medication. 5. Ladies, please do not wear dresses.  Skirts or pants are appropriate. Please wear a short sleeve shirt. 6. No perfume, cologne or lotion. 7. Wear comfortable walking shoes. No heels!       Follow-Up: It was a pleasure seeing you in the office today. Please call us if you have new issues that need to be addressed before your next appt.  [phone]  Your physician wants you to follow-up in: as needed  If you need a refill on your cardiac medications before your next appointment, please call your pharmacy.  For educational health videos Log in to : [email or web address] Or : [email or web address], password : [email or web address]

## 2018-01-01 ENCOUNTER — Ambulatory Visit: Payer: Medicaid Other

## 2018-01-08 ENCOUNTER — Encounter: Payer: Self-pay | Admitting: Nurse Practitioner

## 2018-01-08 ENCOUNTER — Ambulatory Visit (INDEPENDENT_AMBULATORY_CARE_PROVIDER_SITE_OTHER): Payer: Medicaid Other | Admitting: Nurse Practitioner

## 2018-01-08 VITALS — BP 130/98 | HR 105 | Temp 98.0°F | Resp 16 | Wt 152.7 lb

## 2018-01-08 DIAGNOSIS — J0101 Acute recurrent maxillary sinusitis: Secondary | ICD-10-CM | POA: Diagnosis not present

## 2018-01-08 DIAGNOSIS — Z720 Tobacco use: Secondary | ICD-10-CM

## 2018-01-08 DIAGNOSIS — I10 Essential (primary) hypertension: Secondary | ICD-10-CM | POA: Diagnosis not present

## 2018-01-08 DIAGNOSIS — H6122 Impacted cerumen, left ear: Secondary | ICD-10-CM | POA: Diagnosis not present

## 2018-01-08 MED ORDER — LORATADINE 10 MG PO TABS: 10.0000 mg | ORAL_TABLET | Freq: Every day | ORAL | 11 refills | Status: AC

## 2018-01-08 MED ORDER — DOXYCYCLINE HYCLATE 100 MG PO TABS: 100.0000 mg | ORAL_TABLET | Freq: Two times a day (BID) | ORAL | 0 refills | Status: DC

## 2018-01-08 MED ORDER — CARBAMIDE PEROXIDE 6.5 % OT SOLN: 5.0000 [drp] | Freq: Two times a day (BID) | OTIC | 0 refills | Status: AC

## 2018-01-08 NOTE — Progress Notes (Addendum)
Name: Allison Cooke   MRN: [id number]    DOB: 19-Mar-1961   Date:01/08/2018       Progress Note  Subjective  Chief Complaint  Chief Complaint  Patient presents with  . Sinusitis    HPI  States 2 weeks ago had left ear pain and facial pain, then went to nasal congestion and blowing nose a lot and starting a getting a cough. Notes bad odor from nasal drainage. Denies fevers, chills, sore throat, shortness of breath, chest pain, headache or fatigue. Does have itchy eyes. States gets sinus infection usually every year. Illness never got better is progressively getting worse.   Patient drinks lots of water and has been taking dollar store sinus pills. Patient smokes three cigarettes a day. Pt does have high blood pressure, taking meds as prescribe with no missed doses.   Patient Active Problem List   Diagnosis Date Noted  . Chest pain 12/18/2017  . Neck pain, musculoskeletal 09/24/2017  . Chronic pain of both shoulders 09/10/2017  . Subacromial bursitis of left shoulder joint 09/10/2017  . Subacromial bursitis of right shoulder joint 09/10/2017  . Elevated rheumatoid factor 09/10/2017  . Rheumatoid factor positive 08/19/2017  . Vitamin D deficiency 10/04/2016  . Carpal tunnel syndrome, bilateral 06/25/2016  . Chronic pain of right ankle 06/25/2016  . Hyperlipidemia LDL goal <100 06/25/2016  . Fatigue 05/08/2016  . Atopic dermatitis 05/08/2016  . Special screening for malignant neoplasms, colon   . Benign neoplasm of sigmoid colon   . Benign neoplasm of descending colon   . Rectal polyp   . Lumbar back pain 02/03/2016  . Chronic post-traumatic stress disorder (PTSD) 01/29/2016  . Right anterior knee pain 08/03/2015  . Diabetes mellitus type 1.5, managed as type 1 ([place]) 06/28/2015  . Abnormal EKG 04/22/2015  . Dysphagia 03/30/2015  . Medication monitoring encounter 03/30/2015  . Essential hypertension, benign 03/02/2015  . Depression, major, recurrent ([place]) 03/02/2015  . Tobacco  abuse 03/02/2015  . Chronic dermatitis 03/02/2015  . Hoarseness of voice 03/02/2015    Past Medical History:  Diagnosis Date  . Anemia   . Arthritis   . Chronic post-traumatic stress disorder (PTSD) 01/29/2016  . Depression   . Diabetes mellitus without complication ([place])   . Diabetes, type 1.5, uncontrolled, managed as type 1 ([place]) 06/28/2015  . Headache   . Heart murmur   . High cholesterol   . Hypertension   . Lumbar back pain 02/03/2016  . Seizures ([place])    as a child  . SOB (shortness of breath)     Past Surgical History:  Procedure Laterality Date  . BREAST EXCISIONAL BIOPSY Left 2016   NEG  . BREAST LUMPECTOMY Left 07/14/2015   Procedure: BREAST LUMPECTOMY;  Surgeon: Christene Lye, MD;  Location: [facility] ORS;  Service: General;  Laterality: Left;  . BREAST SURGERY Left   . CESAREAN SECTION    . CHOLECYSTECTOMY    . COLONOSCOPY WITH PROPOFOL N/A 03/06/2016   Procedure: COLONOSCOPY WITH PROPOFOL;  Surgeon: [name], MD;  Location: [facility] ENDOSCOPY;  Service: Endoscopy;  Laterality: N/A;  . DIRECT LARYNGOSCOPY N/A 04/26/2015   Procedure: Microlaryngoscopy with exxcision of vocal cord polyp ;  Surgeon: Beverly Gust, MD;  Location: [facility] ORS;  Service: ENT;  Laterality: N/A;  . FINE NEEDLE ASPIRATION Left    breast cyst, benign, age 8?    Social History   Tobacco Use  . Smoking status: Current Some Day Smoker    Packs/day: 0.25  Years: 40.00    Pack years: 10.00    Types: Cigarettes  . Smokeless tobacco: Never Used  Substance Use Topics  . Alcohol use: No     Current Outpatient Medications:  .  albuterol (PROVENTIL HFA) 108 (90 Base) MCG/ACT inhaler, Inhale 2 puffs into the lungs every 6 (six) hours as needed for wheezing or shortness of breath., Disp: 6.7 g, Rfl: 0 .  amLODipine (NORVASC) 5 MG tablet, Take 1 tablet (5 mg total) by mouth every morning., Disp: 30 tablet, Rfl: 11 .  ARIPiprazole (ABILIFY) 15 MG tablet, Take 15 mg by mouth daily., Disp: ,  Rfl:  .  aspirin EC 81 MG tablet, Take 1 tablet (81 mg total) by mouth daily., Disp: 30 tablet, Rfl: 11 .  atorvastatin (LIPITOR) 80 MG tablet, Take 1 tablet (80 mg total) by mouth daily. For cholesterol, Disp: 30 tablet, Rfl: 5 .  Crisaborole (EUCRISA) 2 % OINT, Apply 1 each topically 2 (two) times daily. (Patient taking differently: Apply 1 each topically as needed. ), Disp: 60 g, Rfl: 5 .  cyclobenzaprine (FLEXERIL) 10 MG tablet, Take 1 tablet (10 mg total) by mouth 3 (three) times daily as needed., Disp: 15 tablet, Rfl: 0 .  Elastic Bandages & Supports ([place] LEFT HAND WRIST BRACE) MISC, Wear as often as possible for one month for carpal tunnel syndrome, Disp: 1 each, Rfl: 0 .  escitalopram (LEXAPRO) 10 MG tablet, Take 10 mg by mouth daily., Disp: , Rfl:  .  glucose blood (ACCU-CHEK AVIVA) test strip, Check fingerstick blood sugars three times a day; E11.65, LON 99 months; insulin-dependent; Accu-check Aviva Plus strips, Disp: 100 each, Rfl: 12 .  haloperidol (HALDOL) 5 MG tablet, Take 5 mg by mouth at bedtime., Disp: , Rfl:  .  insulin aspart (NOVOLOG) 100 UNIT/ML FlexPen, Inject 20 Units into the skin 3 (three) times daily before meals., Disp: 5 pen, Rfl: 5 .  Insulin Detemir (LEVEMIR) 100 UNIT/ML Pen, Inject 55 Units into the skin Nightly., Disp: 5 pen, Rfl: 6 .  Lancets (ONETOUCH ULTRASOFT) lancets, Use as instructed, Disp: 100 each, Rfl: 12 .  lisinopril (PRINIVIL,ZESTRIL) 5 MG tablet, Take 1 tablet (5 mg total) by mouth daily., Disp: 30 tablet, Rfl: 5 .  montelukast (SINGULAIR) 10 MG tablet, Take 1 tablet (10 mg total) by mouth at bedtime. For allergies and eczema, Disp: 30 tablet, Rfl: 11 .  QUEtiapine (SEROQUEL) 25 MG tablet, Take 25 mg by mouth daily., Disp: , Rfl:  .  traMADol (ULTRAM) 50 MG tablet, Take 1 tablet (50 mg total) by mouth every 6 (six) hours as needed., Disp: 20 tablet, Rfl: 0 .  traZODone (DESYREL) 150 MG tablet, Take by mouth at bedtime., Disp: , Rfl:  No current  facility-administered medications for this visit.   Facility-Administered Medications Ordered in Other Visits:  .  albuterol (PROVENTIL) (2.5 MG/3ML) 0.083% nebulizer solution 2.5 mg, 2.5 mg, Nebulization, Once, Lada, Satira Anis, MD  Allergies  Allergen Reactions  . Shrimp [Shellfish Allergy] Hives and Itching    ROS   No other specific complaints in a complete review of systems (except as listed in HPI above).  Objective  Vitals:   01/08/18 1013  BP: (!) 130/98  Pulse: (!) 105  Resp: 16  Temp: 98 F (36.7 C)  TempSrc: Oral  SpO2: 99%  Weight: 152 lb 11.2 oz (69.3 kg)     Body mass index is 27.93 kg/m.  Nursing Note and Vital Signs reviewed.  Physical Exam  Constitutional: Patient appears well-developed and well-nourished.  No distress.  HEENT: head atraumatic, normocephalic, pupils equal and reactive to light, Right TM's without erythema or bulging, unable to visualize left TM due to cerumen after irrigation patient ear sensitive unable to visualize will use debrox and return in 2 days for recheck; pt endorses maxillary tenderness worse on left side No frontal sinus tenderness , neck supple without lymphadenopathy, oropharynx pink and moist without exudate, dried nasal discharge Cardiovascular: elevated rate, regular rhythm, S1/S2 present.  No murmur or rub heard.  Pulmonary/Chest: Effort normal and breath sounds clear. No respiratory distress or retractions. Psychiatric: Patient has a normal mood and affect. behavior is normal. Judgment and thought content normal.  No results found for this or any previous visit (from the past 72 hour(s)).  Assessment & Plan  1. Acute recurrent maxillary sinusitis - doxycycline (VIBRA-TABS) 100 MG tablet; Take 1 tablet (100 mg total) by mouth 2 (two) times daily.  Dispense: 20 tablet; Refill: 0 - loratadine (CLARITIN) 10 MG tablet; Take 1 tablet (10 mg total) by mouth daily.  Dispense: 30 tablet; Refill: 11  2. Impacted cerumen  of left ear Use debrox come back in 2 days for recheck  - Ear Lavage  3. Tobacco abuse -smoking cessation discussed  4. Essential hypertension, benign - 2 days for recheck, stop smoking, decrease salt in diet, cont meds   - Continue taking singulair daily and add Claratin daily to help with allergy symptoms - take antibiotic doxycycline every 12 hours with food for 10 days. DO not stop taking medicine if you are feeling better Please do eat yogurt daily or take a probiotic daily for the next month or two. We want to replace the healthy germs in the gut If you notice foul, watery diarrhea in the next two months, schedule an appointment RIGHT AWAY! - use the debrox ear drops to soften up ear canal and come back in 2 days for a ear recheck  -Red flags and when to present for emergency care or RTC including fever >101.8F, chest pain, shortness of breath, new/worsening/un-resolving symptoms, reviewed with patient at time of visit. Follow up and care instructions discussed and provided in AVS.  ---------------------------------------------------------- I have reviewed this encounter including the documentation in this note and/or discussed this patient with the provider, Suezanne Cheshire DNP AGNP-C. I am certifying that I agree with the content of this note as supervising physician. Enid Derry, [facility] 01/17/2018, 5:56 PM

## 2018-01-08 NOTE — Patient Instructions (Addendum)
- Continue taking singulair daily and add Claratin daily to help with allergy symptoms - take antibiotic doxycycline every 12 hours with food for 10 days. DO not stop taking medicine if you are feeling better Please do eat yogurt daily or take a probiotic daily for the next month or two. We want to replace the healthy germs in the gut If you notice foul, watery diarrhea in the next two months, schedule an appointment RIGHT AWAY!  - use the debrox ear drops to soften up ear canal and come back in 2 days for a ear recheck      Coping with Quitting Smoking Quitting smoking is a physical and mental challenge. You will face cravings, withdrawal symptoms, and temptation. Before quitting, work with your health care provider to make a plan that can help you cope. Preparation can help you quit and keep you from giving in. How can I cope with cravings? Cravings usually last for 5-10 minutes. If you get through it, the craving will pass. Consider taking the following actions to help you cope with cravings:  Keep your mouth busy: ? Chew sugar-free gum. ? Suck on hard candies or a straw. ? Brush your teeth.  Keep your hands and body busy: ? Immediately change to a different activity when you feel a craving. ? Squeeze or play with a ball. ? Do an activity or a hobby, like making bead jewelry, practicing needlepoint, or working with wood. ? Mix up your normal routine. ? Take a short exercise break. Go for a quick walk or run up and down stairs. ? Spend time in public places where smoking is not allowed.  Focus on doing something kind or helpful for someone else.  Call a friend or family member to talk during a craving.  Join a support group.  Call a quit line, such as 1-800-QUIT-NOW.  Talk with your health care provider about medicines that might help you cope with cravings and make quitting easier for you.  How can I deal with withdrawal symptoms? Your body may experience negative effects as  it tries to get used to not having nicotine in the system. These effects are called withdrawal symptoms. They may include:  Feeling hungrier than normal.  Trouble concentrating.  Irritability.  Trouble sleeping.  Feeling depressed.  Restlessness and agitation.  Craving a cigarette.  To manage withdrawal symptoms:  Avoid places, people, and activities that trigger your cravings.  Remember why you want to quit.  Get plenty of sleep.  Avoid coffee and other caffeinated drinks. These may worsen some of your symptoms.  How can I handle social situations? Social situations can be difficult when you are quitting smoking, especially in the first few weeks. To manage this, you can:  Avoid parties, bars, and other social situations where people might be smoking.  Avoid alcohol.  Leave right away if you have the urge to smoke.  Explain to your family and friends that you are quitting smoking. Ask for understanding and support.  Plan activities with friends or family where smoking is not an option.  What are some ways I can cope with stress? Wanting to smoke may cause stress, and stress can make you want to smoke. Find ways to manage your stress. Relaxation techniques can help. For example:  Breathe slowly and deeply, in through your nose and out through your mouth.  Listen to soothing, relaxing music.  Talk with a family member or friend about your stress.  Light a candle.  Soak in a bath or take a shower.  Think about a peaceful place.  What are some ways I can prevent weight gain? Be aware that many people gain weight after they quit smoking. However, not everyone does. To keep from gaining weight, have a plan in place before you quit and stick to the plan after you quit. Your plan should include:  Having healthy snacks. When you have a craving, it may help to: ? Eat plain popcorn, crunchy carrots, celery, or other cut vegetables. ? Chew sugar-free gum.  Changing  how you eat: ? Eat small portion sizes at meals. ? Eat 4-6 small meals throughout the day instead of 1-2 large meals a day. ? Be mindful when you eat. Do not watch television or do other things that might distract you as you eat.  Exercising regularly: ? Make time to exercise each day. If you do not have time for a long workout, do short bouts of exercise for 5-10 minutes several times a day. ? Do some form of strengthening exercise, like weight lifting, and some form of aerobic exercise, like running or swimming.  Drinking plenty of water or other low-calorie or no-calorie drinks. Drink 6-8 glasses of water daily, or as much as instructed by your health care provider.  Summary  Quitting smoking is a physical and mental challenge. You will face cravings, withdrawal symptoms, and temptation to smoke again. Preparation can help you as you go through these challenges.  You can cope with cravings by keeping your mouth busy (such as by chewing gum), keeping your body and hands busy, and making calls to family, friends, or a helpline for people who want to quit smoking.  You can cope with withdrawal symptoms by avoiding places where people smoke, avoiding drinks with caffeine, and getting plenty of rest.  Ask your health care provider about the different ways to prevent weight gain, avoid stress, and handle social situations. This information is not intended to replace advice given to you by your health care provider. Make sure you discuss any questions you have with your health care provider. Document Released: 07/20/2016 Document Revised: 07/20/2016 Document Reviewed: 07/20/2016 Elsevier Interactive Patient Education  [employer].

## 2018-01-10 ENCOUNTER — Ambulatory Visit: Payer: Medicaid Other | Admitting: Nurse Practitioner

## 2018-01-15 ENCOUNTER — Encounter: Payer: Self-pay | Admitting: Cardiovascular Disease

## 2018-03-26 ENCOUNTER — Other Ambulatory Visit: Payer: Self-pay | Admitting: Family Medicine

## 2018-08-12 ENCOUNTER — Telehealth: Payer: Self-pay | Admitting: *Deleted

## 2018-08-12 NOTE — Telephone Encounter (Signed)
Meriam Sprague B  P [facility] Triage        fyi unable to schedule with patient after multiple attempts and mailing letter. Fu was as needed. Can I remove from scheduling wq?     Routing to [place] as an FYI to make him aware.

## 2019-01-01 DIAGNOSIS — E7800 Pure hypercholesterolemia, unspecified: Secondary | ICD-10-CM | POA: Diagnosis not present

## 2019-01-01 DIAGNOSIS — I10 Essential (primary) hypertension: Secondary | ICD-10-CM | POA: Diagnosis not present

## 2019-01-01 DIAGNOSIS — E1165 Type 2 diabetes mellitus with hyperglycemia: Secondary | ICD-10-CM | POA: Diagnosis not present
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
## 2019-07-08 DIAGNOSIS — R69 Illness, unspecified: Secondary | ICD-10-CM | POA: Diagnosis not present

## 2019-07-08 DIAGNOSIS — I10 Essential (primary) hypertension: Secondary | ICD-10-CM | POA: Diagnosis not present

## 2019-07-08 DIAGNOSIS — E1140 Type 2 diabetes mellitus with diabetic neuropathy, unspecified: Secondary | ICD-10-CM | POA: Diagnosis not present

## 2019-08-20 DIAGNOSIS — Z720 Tobacco use: Secondary | ICD-10-CM | POA: Diagnosis not present

## 2019-08-20 DIAGNOSIS — I10 Essential (primary) hypertension: Secondary | ICD-10-CM | POA: Diagnosis not present

## 2019-08-20 DIAGNOSIS — J45909 Unspecified asthma, uncomplicated: Secondary | ICD-10-CM | POA: Diagnosis not present

## 2019-08-20 DIAGNOSIS — Z794 Long term (current) use of insulin: Secondary | ICD-10-CM | POA: Diagnosis not present

## 2019-08-20 DIAGNOSIS — M1990 Unspecified osteoarthritis, unspecified site: Secondary | ICD-10-CM | POA: Diagnosis not present

## 2019-08-20 DIAGNOSIS — Z604 Social exclusion and rejection: Secondary | ICD-10-CM | POA: Diagnosis not present

## 2019-08-20 DIAGNOSIS — F259 Schizoaffective disorder, unspecified: Secondary | ICD-10-CM | POA: Diagnosis not present

## 2019-08-20 DIAGNOSIS — E1165 Type 2 diabetes mellitus with hyperglycemia: Secondary | ICD-10-CM | POA: Diagnosis not present

## 2019-08-20 DIAGNOSIS — Z7982 Long term (current) use of aspirin: Secondary | ICD-10-CM | POA: Diagnosis not present

## 2019-08-20 DIAGNOSIS — G4700 Insomnia, unspecified: Secondary | ICD-10-CM | POA: Diagnosis not present

## 2019-08-20 DIAGNOSIS — R69 Illness, unspecified: Secondary | ICD-10-CM | POA: Diagnosis not present

## 2019-08-20 DIAGNOSIS — E1136 Type 2 diabetes mellitus with diabetic cataract: Secondary | ICD-10-CM | POA: Diagnosis not present

## 2019-10-16 DIAGNOSIS — E119 Type 2 diabetes mellitus without complications: Secondary | ICD-10-CM | POA: Diagnosis not present

## 2019-10-16 DIAGNOSIS — I10 Essential (primary) hypertension: Secondary | ICD-10-CM | POA: Diagnosis not present

## 2019-10-16 DIAGNOSIS — R69 Illness, unspecified: Secondary | ICD-10-CM | POA: Diagnosis not present

## 2020-01-02 DIAGNOSIS — M545 Low back pain: Secondary | ICD-10-CM | POA: Diagnosis not present

## 2020-01-02 DIAGNOSIS — N12 Tubulo-interstitial nephritis, not specified as acute or chronic: Secondary | ICD-10-CM | POA: Diagnosis not present

## 2020-01-02 DIAGNOSIS — N200 Calculus of kidney: Secondary | ICD-10-CM | POA: Diagnosis not present

## 2020-02-03 DIAGNOSIS — E1165 Type 2 diabetes mellitus with hyperglycemia: Secondary | ICD-10-CM | POA: Diagnosis not present

## 2020-02-03 DIAGNOSIS — R69 Illness, unspecified: Secondary | ICD-10-CM | POA: Diagnosis not present

## 2020-02-03 DIAGNOSIS — I10 Essential (primary) hypertension: Secondary | ICD-10-CM | POA: Diagnosis not present

## 2020-02-03 DIAGNOSIS — Z6829 Body mass index (BMI) 29.0-29.9, adult: Secondary | ICD-10-CM | POA: Diagnosis not present

## 2020-02-15 DIAGNOSIS — I10 Essential (primary) hypertension: Secondary | ICD-10-CM | POA: Diagnosis not present

## 2020-02-15 DIAGNOSIS — E1136 Type 2 diabetes mellitus with diabetic cataract: Secondary | ICD-10-CM | POA: Diagnosis not present

## 2020-02-15 DIAGNOSIS — Z79899 Other long term (current) drug therapy: Secondary | ICD-10-CM | POA: Diagnosis not present

## 2020-02-29 DIAGNOSIS — Z1389 Encounter for screening for other disorder: Secondary | ICD-10-CM | POA: Diagnosis not present

## 2020-02-29 DIAGNOSIS — F339 Major depressive disorder, recurrent, unspecified: Secondary | ICD-10-CM | POA: Diagnosis not present

## 2020-02-29 DIAGNOSIS — Z0001 Encounter for general adult medical examination with abnormal findings: Secondary | ICD-10-CM | POA: Diagnosis not present

## 2020-02-29 DIAGNOSIS — E1165 Type 2 diabetes mellitus with hyperglycemia: Secondary | ICD-10-CM | POA: Diagnosis not present

## 2020-02-29 DIAGNOSIS — F209 Schizophrenia, unspecified: Secondary | ICD-10-CM | POA: Diagnosis not present

## 2020-02-29 DIAGNOSIS — Z1331 Encounter for screening for depression: Secondary | ICD-10-CM | POA: Diagnosis not present

## 2020-02-29 DIAGNOSIS — I10 Essential (primary) hypertension: Secondary | ICD-10-CM | POA: Diagnosis not present

## 2020-03-02 ENCOUNTER — Other Ambulatory Visit (HOSPITAL_COMMUNITY): Payer: Self-pay | Admitting: Internal Medicine

## 2020-03-02 DIAGNOSIS — N6489 Other specified disorders of breast: Secondary | ICD-10-CM

## 2020-03-21 ENCOUNTER — Other Ambulatory Visit: Payer: Self-pay

## 2020-03-21 ENCOUNTER — Other Ambulatory Visit (HOSPITAL_COMMUNITY): Payer: Self-pay | Admitting: Internal Medicine

## 2020-03-21 ENCOUNTER — Ambulatory Visit (HOSPITAL_COMMUNITY)
Admission: RE | Admit: 2020-03-21 | Discharge: 2020-03-21 | Disposition: A | Discharge status: Home / Self Care | Payer: Medicare HMO | Source: Ambulatory Visit | Attending: Internal Medicine | Admitting: Internal Medicine

## 2020-03-21 DIAGNOSIS — F339 Major depressive disorder, recurrent, unspecified: Secondary | ICD-10-CM | POA: Diagnosis not present

## 2020-03-21 DIAGNOSIS — I10 Essential (primary) hypertension: Secondary | ICD-10-CM | POA: Diagnosis not present

## 2020-03-21 DIAGNOSIS — E1165 Type 2 diabetes mellitus with hyperglycemia: Secondary | ICD-10-CM | POA: Diagnosis not present

## 2020-03-21 DIAGNOSIS — M545 Low back pain, unspecified: Secondary | ICD-10-CM

## 2020-03-24 ENCOUNTER — Ambulatory Visit (HOSPITAL_COMMUNITY): Payer: Medicare HMO

## 2020-03-24 ENCOUNTER — Encounter (HOSPITAL_COMMUNITY): Payer: Medicaid Other

## 2020-04-19 ENCOUNTER — Ambulatory Visit (HOSPITAL_COMMUNITY)
Admission: RE | Admit: 2020-04-19 | Discharge: 2020-04-19 | Disposition: A | Discharge status: Home / Self Care | Payer: Medicare HMO | Source: Ambulatory Visit | Attending: Internal Medicine | Admitting: Internal Medicine

## 2020-04-19 ENCOUNTER — Other Ambulatory Visit: Payer: Self-pay

## 2020-04-19 DIAGNOSIS — R922 Inconclusive mammogram: Secondary | ICD-10-CM | POA: Diagnosis not present

## 2020-04-19 DIAGNOSIS — N6489 Other specified disorders of breast: Secondary | ICD-10-CM | POA: Diagnosis not present

## 2020-04-19 DIAGNOSIS — N6041 Mammary duct ectasia of right breast: Secondary | ICD-10-CM | POA: Diagnosis not present

## 2020-04-21 DIAGNOSIS — I10 Essential (primary) hypertension: Secondary | ICD-10-CM | POA: Diagnosis not present

## 2020-04-21 DIAGNOSIS — E1165 Type 2 diabetes mellitus with hyperglycemia: Secondary | ICD-10-CM | POA: Diagnosis not present

## 2020-05-21 DIAGNOSIS — I10 Essential (primary) hypertension: Secondary | ICD-10-CM | POA: Diagnosis not present

## 2020-05-21 DIAGNOSIS — E1165 Type 2 diabetes mellitus with hyperglycemia: Secondary | ICD-10-CM | POA: Diagnosis not present

## 2020-05-25 ENCOUNTER — Other Ambulatory Visit: Payer: Self-pay

## 2020-05-25 ENCOUNTER — Emergency Department (HOSPITAL_COMMUNITY)
Admission: EM | Admit: 2020-05-25 | Discharge: 2020-05-25 | Disposition: A | Discharge status: Home / Self Care | Payer: Medicare HMO | Attending: Emergency Medicine | Admitting: Emergency Medicine

## 2020-05-25 ENCOUNTER — Emergency Department (HOSPITAL_COMMUNITY): Payer: Medicare HMO

## 2020-05-25 ENCOUNTER — Encounter (HOSPITAL_COMMUNITY): Payer: Self-pay | Admitting: Emergency Medicine

## 2020-05-25 DIAGNOSIS — Z7982 Long term (current) use of aspirin: Secondary | ICD-10-CM | POA: Diagnosis not present

## 2020-05-25 DIAGNOSIS — Z79899 Other long term (current) drug therapy: Secondary | ICD-10-CM | POA: Insufficient documentation

## 2020-05-25 DIAGNOSIS — F17210 Nicotine dependence, cigarettes, uncomplicated: Secondary | ICD-10-CM | POA: Insufficient documentation

## 2020-05-25 DIAGNOSIS — E109 Type 1 diabetes mellitus without complications: Secondary | ICD-10-CM | POA: Diagnosis not present

## 2020-05-25 DIAGNOSIS — I10 Essential (primary) hypertension: Secondary | ICD-10-CM | POA: Insufficient documentation

## 2020-05-25 DIAGNOSIS — M25511 Pain in right shoulder: Secondary | ICD-10-CM | POA: Diagnosis present

## 2020-05-25 DIAGNOSIS — M25711 Osteophyte, right shoulder: Secondary | ICD-10-CM | POA: Diagnosis not present

## 2020-05-25 DIAGNOSIS — Z85038 Personal history of other malignant neoplasm of large intestine: Secondary | ICD-10-CM | POA: Diagnosis not present

## 2020-05-25 DIAGNOSIS — M7541 Impingement syndrome of right shoulder: Secondary | ICD-10-CM

## 2020-05-25 DIAGNOSIS — Z794 Long term (current) use of insulin: Secondary | ICD-10-CM | POA: Diagnosis not present

## 2020-05-25 MED ORDER — HYDROCODONE-ACETAMINOPHEN 5-325 MG PO TABS: 1.0000 | ORAL_TABLET | ORAL | 0 refills | Status: DC | PRN

## 2020-05-25 NOTE — ED Triage Notes (Signed)
Patient states right arm has been hurting for three days. No known injury

## 2020-05-26 NOTE — ED Provider Notes (Signed)
[facility] EMERGENCY DEPARTMENT Provider Note   CSN: [id number] Arrival date & time: 05/25/20  2355     History No chief complaint on file.   Allison Cooke is a 59 y.o. female.  The history is provided by the patient. No language interpreter was used.  Shoulder Pain Location:  Shoulder Shoulder location:  R shoulder Injury: no   Pain details:    Quality:  Aching   Radiates to:  Does not radiate   Severity:  Moderate   Onset quality:  Gradual   Timing:  Constant   Progression:  Worsening Dislocation: no   Foreign body present:  No foreign bodies Relieved by:  Nothing Worsened by:  Nothing Ineffective treatments:  None tried Associated symptoms: no swelling   Risk factors: no known bone disorder        Past Medical History:  Diagnosis Date  . Anemia   . Arthritis   . Chronic post-traumatic stress disorder (PTSD) 01/29/2016  . Depression   . Diabetes mellitus without complication ([place])   . Diabetes, type 1.5, uncontrolled, managed as type 1 ([place]) 06/28/2015  . Headache   . Heart murmur   . High cholesterol   . Hypertension   . Lumbar back pain 02/03/2016  . Seizures ([place])    as a child  . SOB (shortness of breath)     Patient Active Problem List   Diagnosis Date Noted  . Chest pain 12/18/2017  . Neck pain, musculoskeletal 09/24/2017  . Chronic pain of both shoulders 09/10/2017  . Subacromial bursitis of left shoulder joint 09/10/2017  . Subacromial bursitis of right shoulder joint 09/10/2017  . Elevated rheumatoid factor 09/10/2017  . Rheumatoid factor positive 08/19/2017  . Vitamin D deficiency 10/04/2016  . Carpal tunnel syndrome, bilateral 06/25/2016  . Chronic pain of right ankle 06/25/2016  . Hyperlipidemia LDL goal <100 06/25/2016  . Fatigue 05/08/2016  . Atopic dermatitis 05/08/2016  . Special screening for malignant neoplasms, colon   . Benign neoplasm of sigmoid colon   . Benign neoplasm of descending colon   . Rectal polyp   . Lumbar  back pain 02/03/2016  . Chronic post-traumatic stress disorder (PTSD) 01/29/2016  . Right anterior knee pain 08/03/2015  . Diabetes mellitus type 1.5, managed as type 1 ([place]) 06/28/2015  . Abnormal EKG 04/22/2015  . Dysphagia 03/30/2015  . Medication monitoring encounter 03/30/2015  . Essential hypertension, benign 03/02/2015  . Depression, major, recurrent ([place]) 03/02/2015  . Tobacco abuse 03/02/2015  . Chronic dermatitis 03/02/2015  . Hoarseness of voice 03/02/2015    Past Surgical History:  Procedure Laterality Date  . BREAST EXCISIONAL BIOPSY Left 2016   NEG  . BREAST LUMPECTOMY Left 07/14/2015   Procedure: BREAST LUMPECTOMY;  Surgeon: Christene Lye, MD;  Location: [facility] ORS;  Service: General;  Laterality: Left;  . BREAST SURGERY Left   . CESAREAN SECTION    . CHOLECYSTECTOMY    . COLONOSCOPY WITH PROPOFOL N/A 03/06/2016   Procedure: COLONOSCOPY WITH PROPOFOL;  Surgeon: [name], MD;  Location: [facility] ENDOSCOPY;  Service: Endoscopy;  Laterality: N/A;  . DIRECT LARYNGOSCOPY N/A 04/26/2015   Procedure: Microlaryngoscopy with exxcision of vocal cord polyp ;  Surgeon: Beverly Gust, MD;  Location: [facility] ORS;  Service: ENT;  Laterality: N/A;  . FINE NEEDLE ASPIRATION Left    breast cyst, benign, age 68?     OB History   No obstetric history on file.     Family History  Problem Relation Age of  Onset  . Hypertension Mother   . Heart disease Mother   . Hyperlipidemia Mother   . Diabetes Mother   . Heart disease Father   . Hyperlipidemia Father   . Hypertension Father   . Diabetes Sister   . Hypertension Daughter   . Diabetes Maternal Grandmother   . Diabetes Sister   . Heart failure Sister   . Cancer Neg Hx   . Stroke Neg Hx   . COPD Neg Hx   . Breast cancer Neg Hx     Social History   Tobacco Use  . Smoking status: Current Some Day Smoker    Packs/day: 0.25    Years: 40.00    Pack years: 10.00    Types: Cigarettes  . Smokeless tobacco: Never Used    Vaping Use  . Vaping Use: Never used  Substance Use Topics  . Alcohol use: No  . Drug use: No    Home Medications Prior to Admission medications   Medication Sig Start Date End Date Taking? Authorizing Provider  amLODipine (NORVASC) 5 MG tablet Take 1 tablet (5 mg total) by mouth every morning. 10/01/17   Arnetha Courser, MD  ARIPiprazole (ABILIFY) 15 MG tablet Take 15 mg by mouth daily.    [provider]  aspirin EC 81 MG tablet Take 1 tablet (81 mg total) by mouth daily. 10/01/17   Arnetha Courser, MD  atorvastatin (LIPITOR) 80 MG tablet Take 1 tablet (80 mg total) by mouth daily. For cholesterol 10/01/17   Lada, Satira Anis, MD  carbamide peroxide (DEBROX) 6.5 % OTIC solution Place 5 drops into the left ear 2 (two) times daily. 01/08/18   Poulose, Bethel Born, NP  Crisaborole (EUCRISA) 2 % OINT Apply 1 each topically 2 (two) times daily. Patient taking differently: Apply 1 each topically as needed.  05/08/16   Arnetha Courser, MD  cyclobenzaprine (FLEXERIL) 10 MG tablet Take 1 tablet (10 mg total) by mouth 3 (three) times daily as needed. 08/14/17   Sable Feil, PA-C  doxycycline (VIBRA-TABS) 100 MG tablet Take 1 tablet (100 mg total) by mouth 2 (two) times daily. 01/08/18   Poulose, Bethel Born, NP  Elastic Bandages & Supports (FUTURO LEFT HAND WRIST BRACE) MISC Wear as often as possible for one month for carpal tunnel syndrome 10/04/16   Lada, Satira Anis, MD  escitalopram (LEXAPRO) 10 MG tablet Take 10 mg by mouth daily.    [provider]  glucose blood (ACCU-CHEK AVIVA) test strip Check fingerstick blood sugars three times a day; E11.65, LON 99 months; insulin-dependent; Accu-check Aviva Plus strips 02/10/16   Gladstone Lighter, MD  haloperidol (HALDOL) 5 MG tablet Take 5 mg by mouth at bedtime.    [provider]  HYDROcodone-acetaminophen (NORCO/VICODIN) 5-325 MG tablet Take 1 tablet by mouth every 4 (four) hours as needed. 05/25/20   Fransico Meadow, PA-C  insulin  aspart (NOVOLOG) 100 UNIT/ML FlexPen Inject 20 Units into the skin 3 (three) times daily before meals. 10/01/17   Arnetha Courser, MD  Insulin Detemir (LEVEMIR) 100 UNIT/ML Pen Inject 55 Units into the skin Nightly. 08/16/17   Arnetha Courser, MD  Lancets (ONETOUCH ULTRASOFT) lancets Use as instructed 02/10/16   Gladstone Lighter, MD  lisinopril (PRINIVIL,ZESTRIL) 5 MG tablet Take 1 tablet (5 mg total) by mouth daily. 10/01/17   Arnetha Courser, MD  loratadine (CLARITIN) 10 MG tablet Take 1 tablet (10 mg total) by mouth daily. 01/08/18   Suezanne Cheshire  E, NP  montelukast (SINGULAIR) 10 MG tablet Take 1 tablet (10 mg total) by mouth at bedtime. For allergies and eczema 04/26/17   Lada, Satira Anis, MD  PROVENTIL HFA 108 (90 Base) MCG/ACT inhaler INHALE 2 PUFFS INTO THE LUNGS EVERY 6 HOURS AS NEEDED FOR WHEEZING ORSHORTNESS OF BREATH. 03/26/18   Lada, Satira Anis, MD  QUEtiapine (SEROQUEL) 25 MG tablet Take 25 mg by mouth daily.    [provider]  traZODone (DESYREL) 150 MG tablet Take by mouth at bedtime. 04/26/17   Arnetha Courser, MD    Allergies    Shrimp [shellfish allergy]  Review of Systems   Review of Systems  Musculoskeletal: Positive for myalgias. Negative for joint swelling.  All other systems reviewed and are negative.   Physical Exam Updated Vital Signs BP (!) 133/95   Pulse 71   Temp 98 F (36.7 C) (Oral)   Resp 20   Ht 5\' 3"  (1.6 m)   Wt 72.6 kg   LMP 04/29/2015 (Approximate)   SpO2 99%   BMI 28.34 kg/m   Physical Exam Vitals and nursing note reviewed.  Constitutional:      Appearance: She is well-developed.  HENT:     Head: Normocephalic.  Cardiovascular:     Rate and Rhythm: Normal rate.  Pulmonary:     Effort: Pulmonary effort is normal.  Abdominal:     General: There is no distension.  Musculoskeletal:     Cervical back: Normal range of motion.     Comments: Decreased range of motion shoulder,  nv and ns intact  Pt unable to abduct fully     Neurological:     Mental Status: She is alert and oriented to person, place, and time.     ED Results / Procedures / Treatments   Labs (all labs ordered are listed, but only abnormal results are displayed) Labs Reviewed - No data to display  EKG None  Radiology DG Shoulder Right  Result Date: 05/25/2020 CLINICAL DATA:  Shoulder pain over the last few days. EXAM: RIGHT SHOULDER - 2+ VIEW COMPARISON:  None. FINDINGS: Glenohumeral joint appears normal. Mild narrowing of the humeral acromial distance. Inferiorly projecting osteophytes from the [facility] joint that could cause rotator cuff impingement. IMPRESSION: No acute finding. Inferiorly projecting osteophytes from the [facility] joint that could cause rotator cuff impingement. Electronically Signed   By: Nelson Chimes M.D.   On: 05/25/2020 12:33    Procedures Procedures (including critical care time)  Medications Ordered in ED Medications - No data to display  ED Course  I have reviewed the triage vital signs and the nursing notes.  Pertinent labs & imaging results that were available during my care of the patient were reviewed by me and considered in my medical decision making (see chart for details).    MDM Rules/Calculators/A&P                          MDM:  I suspect rotator cuff impingement based on symptoms and xray.  Pt advised to schedule to see the Orthopaedist for recheck.  Final Clinical Impression(s) / ED Diagnoses Final diagnoses:  Shoulder impingement, right    Rx / DC Orders ED Discharge Orders         Ordered    HYDROcodone-acetaminophen (NORCO/VICODIN) 5-325 MG tablet  Every 4 hours PRN        05/25/20 1247        An After Visit Summary  was printed and given to the patient.    Fransico Meadow, [place] 05/26/20 9509    Hayden Rasmussen, MD 05/26/20 1247

## 2020-05-27 ENCOUNTER — Other Ambulatory Visit: Payer: Self-pay

## 2020-05-27 ENCOUNTER — Ambulatory Visit (INDEPENDENT_AMBULATORY_CARE_PROVIDER_SITE_OTHER): Payer: Medicare HMO | Admitting: Orthopedic Surgery

## 2020-05-27 ENCOUNTER — Encounter: Payer: Self-pay | Admitting: Orthopedic Surgery

## 2020-05-27 VITALS — BP 112/77 | HR 89 | Ht 63.0 in | Wt 156.0 lb

## 2020-05-27 DIAGNOSIS — M7581 Other shoulder lesions, right shoulder: Secondary | ICD-10-CM | POA: Diagnosis not present

## 2020-05-27 NOTE — Progress Notes (Signed)
New Patient Visit  Assessment: Allison Cooke is a 59 y.o. female with the following: 1.  Right rotator cuff tendinitis  Plan: [name] presents with acute onset right shoulder pain which came on atraumatically.  On physical exam, she has excellent range of motion, with slight weakness in her strength which I believe is associated with the pain she is experiencing.  With all strength testing of her right shoulder, the pain is always posterior and lateral, consistent with irritation of the rotator cuff.  As a result, I recommended proceeding with a steroid injection, and of provided her with a series of shoulder exercises that she can start when she is able.  She is elected to proceed.  Please refer to the procedure note listed below.  No scheduled follow-up will be needed at this time, but if she continues to have issues, I encouraged her to return to clinic.  Depending on how things progress over the coming weeks, we may consider obtaining an MRI for further evaluation.  Procedure note injection  right shoulder    Verbal consent was obtained to inject the right shoulder, subacromial space Timeout was completed to confirm the site of injection.  The skin was prepped with alcohol and ethyl chloride was sprayed at the injection site.  A 21-gauge needle was used to inject 40 mg of Depo-Medrol and 1% lidocaine (3 cc) into the subacromial space of the right shoulder using a posterolateral approach.  There were no complications. A sterile bandage was applied.   Follow-up: Return if symptoms worsen or fail to improve.  Subjective:  Chief Complaint  Patient presents with  . Shoulder Pain    right shoulder pain, no injury or fall that patient is aware of x 2 days    History of Present Illness: Allison Cooke is a 59 y.o. RHD female who presents for evaluation of her right shoulder.  She been having pain, primarily in the posterior lateral aspect of her shoulder which came on suddenly.  She  denies a specific inciting event.  No injuries in the past.  She is limited in her overall function as a result.  The pain has been severe enough to cause her to come to the emergency department for evaluation.  X-rays done in the emergency department were without obvious damage.  She is not in any physical therapy.  She takes medications as needed.  She has not had a recent steroid injection in her shoulder.  She has had them in her knee however.  Review of Systems: No numbness or tingling No fevers or chills No chest pain No shortness of breath  Medical History:  Past Medical History:  Diagnosis Date  . Anemia   . Arthritis   . Chronic post-traumatic stress disorder (PTSD) 01/29/2016  . Depression   . Diabetes mellitus without complication ([place])   . Diabetes, type 1.5, uncontrolled, managed as type 1 ([place]) 06/28/2015  . Headache   . Heart murmur   . High cholesterol   . Hypertension   . Lumbar back pain 02/03/2016  . Seizures ([place])    as a child  . SOB (shortness of breath)     Past Surgical History:  Procedure Laterality Date  . BREAST EXCISIONAL BIOPSY Left 2016   NEG  . BREAST LUMPECTOMY Left 07/14/2015   Procedure: BREAST LUMPECTOMY;  Surgeon: Christene Lye, MD;  Location: [facility] ORS;  Service: General;  Laterality: Left;  . BREAST SURGERY Left   . CESAREAN SECTION    .  CHOLECYSTECTOMY    . COLONOSCOPY WITH PROPOFOL N/A 03/06/2016   Procedure: COLONOSCOPY WITH PROPOFOL;  Surgeon: [name], MD;  Location: [facility] ENDOSCOPY;  Service: Endoscopy;  Laterality: N/A;  . DIRECT LARYNGOSCOPY N/A 04/26/2015   Procedure: Microlaryngoscopy with exxcision of vocal cord polyp ;  Surgeon: Beverly Gust, MD;  Location: [facility] ORS;  Service: ENT;  Laterality: N/A;  . FINE NEEDLE ASPIRATION Left    breast cyst, benign, age 64?    Family History  Problem Relation Age of Onset  . Hypertension Mother   . Heart disease Mother   . Hyperlipidemia Mother   . Diabetes Mother   . Heart  disease Father   . Hyperlipidemia Father   . Hypertension Father   . Diabetes Sister   . Hypertension Daughter   . Diabetes Maternal Grandmother   . Diabetes Sister   . Heart failure Sister   . Cancer Neg Hx   . Stroke Neg Hx   . COPD Neg Hx   . Breast cancer Neg Hx    Social History   Tobacco Use  . Smoking status: Current Some Day Smoker    Packs/day: 0.25    Years: 40.00    Pack years: 10.00    Types: Cigarettes  . Smokeless tobacco: Never Used  Vaping Use  . Vaping Use: Never used  Substance Use Topics  . Alcohol use: No  . Drug use: No    Allergies  Allergen Reactions  . Shrimp [Shellfish Allergy] Hives and Itching    Current Meds  Medication Sig  . amLODipine (NORVASC) 5 MG tablet Take 1 tablet (5 mg total) by mouth every morning.  . ARIPiprazole (ABILIFY) 15 MG tablet Take 15 mg by mouth daily.  Marland Kitchen aspirin EC 81 MG tablet Take 1 tablet (81 mg total) by mouth daily.  Marland Kitchen atorvastatin (LIPITOR) 80 MG tablet Take 1 tablet (80 mg total) by mouth daily. For cholesterol  . carbamide peroxide (DEBROX) 6.5 % OTIC solution Place 5 drops into the left ear 2 (two) times daily.  Stasia Cavalier (EUCRISA) 2 % OINT Apply 1 each topically 2 (two) times daily. (Patient taking differently: Apply 1 each topically as needed. )  . cyclobenzaprine (FLEXERIL) 10 MG tablet Take 1 tablet (10 mg total) by mouth 3 (three) times daily as needed.  . doxycycline (VIBRA-TABS) 100 MG tablet Take 1 tablet (100 mg total) by mouth 2 (two) times daily.  Water engineer Bandages & Supports (FUTURO LEFT HAND WRIST BRACE) MISC Wear as often as possible for one month for carpal tunnel syndrome  . escitalopram (LEXAPRO) 10 MG tablet Take 10 mg by mouth daily.  Marland Kitchen glucose blood (ACCU-CHEK AVIVA) test strip Check fingerstick blood sugars three times a day; E11.65, LON 99 months; insulin-dependent; Accu-check Aviva Plus strips  . haloperidol (HALDOL) 5 MG tablet Take 5 mg by mouth at bedtime.  Marland Kitchen  HYDROcodone-acetaminophen (NORCO/VICODIN) 5-325 MG tablet Take 1 tablet by mouth every 4 (four) hours as needed.  . insulin aspart (NOVOLOG) 100 UNIT/ML FlexPen Inject 20 Units into the skin 3 (three) times daily before meals.  . Insulin Detemir (LEVEMIR) 100 UNIT/ML Pen Inject 55 Units into the skin Nightly.  . Lancets (ONETOUCH ULTRASOFT) lancets Use as instructed  . lisinopril (PRINIVIL,ZESTRIL) 5 MG tablet Take 1 tablet (5 mg total) by mouth daily.  Marland Kitchen loratadine (CLARITIN) 10 MG tablet Take 1 tablet (10 mg total) by mouth daily.  . montelukast (SINGULAIR) 10 MG tablet Take 1 tablet (10 mg total)  by mouth at bedtime. For allergies and eczema  . PROVENTIL HFA 108 (90 Base) MCG/ACT inhaler INHALE 2 PUFFS INTO THE LUNGS EVERY 6 HOURS AS NEEDED FOR WHEEZING ORSHORTNESS OF BREATH.  . QUEtiapine (SEROQUEL) 25 MG tablet Take 25 mg by mouth daily.  . traZODone (DESYREL) 150 MG tablet Take by mouth at bedtime.    Objective: BP 112/77   Pulse 89   Ht 5\' 3"  (1.6 m)   Wt 156 lb (70.8 kg)   LMP 04/29/2015 (Approximate)   BMI 27.63 kg/m   Physical Exam:  General: Alert and oriented.  Became tearful when discussing her shoulder and disability. Gait: Normal  Evaluation of the right shoulder demonstrates no obvious deformity or atrophy.  Range of motion is near full compared to the contralateral side.  150 degrees of forward flexion, 60 degrees of external rotation at her side internal rotation to T12.  Strength is only limited by pain.  She does have some tenderness to palpation over the posterior lateral aspect of the shoulder.  Pain with cross body adduction.  Positive O'Brien's testing.   IMAGING: I personally ordered and reviewed the following images:  X-rays from the emergency department demonstrates neutral overall alignment.  There is slight loss of glenohumeral joint space.  No significant proximal humeral migration.  New Medications:  No orders of the defined types were placed in this  encounter.     Mordecai Rasmussen, MD  05/27/2020 12:16 PM

## 2020-05-27 NOTE — Patient Instructions (Addendum)
Instructions Following Joint Injections  In clinic today, you received an injection in one of your joints (sometimes more than one).  Occasionally, you can have some pain at the injection site, this is normal.  You can place ice at the injection site, or take over-the-counter medications such as Tylenol (acetaminophen) or Advil (ibuprofen).  Please follow all directions listed on the bottle.  If your joint (knee or shoulder) becomes swollen, red or very painful, please contact the clinic for additional assistance.   Two medications were injected, including lidocaine and a steroid (often referred to as cortisone).  Lidocaine is effective almost immediately but wears off quickly.  However, the steroid can take a few days to improve your symptoms.  In some cases, it can make your pain worse for a couple of days.  Do not be concerned if this happens as it is common.  You can apply ice or take some over-the-counter medications as needed.       Rotator Cuff Tendinitis  Rotator cuff tendinitis is inflammation of the tough, cord-like bands that connect muscle to bone (tendons) in the rotator cuff. The rotator cuff includes all of the muscles and tendons that connect the arm to the shoulder. The rotator cuff holds the head of the upper arm bone (humerus) in the cup (fossa) of the shoulder blade (scapula). This condition can lead to a long-lasting (chronic) tear. The tear may be partial or complete. What are the causes? This condition is usually caused by overusing the rotator cuff. What increases the risk? This condition is more likely to develop in athletes and workers who frequently use their shoulder or reach over their heads. This can include activities such as:  Tennis.  Baseball or softball.  Swimming.  Construction work.  Painting. What are the signs or symptoms? Symptoms of this condition include:  Pain spreading (radiating) from the shoulder to the upper arm.  Swelling and  tenderness in front of the shoulder.  Pain when reaching, pulling, or lifting the arm above the head.  Pain when lowering the arm from above the head.  Minor pain in the shoulder when resting.  Increased pain in the shoulder at night.  Difficulty placing the arm behind the back. How is this diagnosed? This condition is diagnosed with a medical history and physical exam. Tests may also be done, including:  X-rays.  MRI.  Ultrasounds.  CT or MR arthrogram. During this test, a contrast material is injected and then images are taken. How is this treated? Treatment for this condition depends on the severity of the condition. In less severe cases, treatment may include:  Rest. This may be done with a sling that holds the shoulder still (immobilization). Your health care provider may also recommend avoiding activities that involve lifting your arm over your head.  Icing the shoulder.  Anti-inflammatory medicines, such as aspirin or ibuprofen. In more severe cases, treatment may include:  Physical therapy.  Steroid injections.  Surgery. Follow these instructions at home: If you have a sling:  Wear the sling as told by your health care provider. Remove it only as told by your health care provider.  Loosen the sling if your fingers tingle, become numb, or turn cold and blue.  Keep the sling clean.  If the sling is not waterproof, do not let it get wet. Remove it, if allowed, or cover it with a watertight covering when you take a bath or shower. Managing pain, stiffness, and swelling  If directed, put ice  on the injured area. ? If you have a removable sling, remove it as told by your health care provider. ? Put ice in a plastic bag. ? Place a towel between your skin and the bag. ? Leave the ice on for 20 minutes, 2-3 times a day.  Move your fingers often to avoid stiffness and to lessen swelling.  Raise (elevate) the injured area above the level of your heart while you  are lying down.  Find a comfortable sleeping position or sleep on a recliner, if available. Driving  Do not drive or use heavy machinery while taking prescription pain medicine.  Ask your health care provider when it is safe to drive if you have a sling on your arm. Activity  Rest your shoulder as told by your health care provider.  Return to your normal activities as told by your health care provider. Ask your health care provider what activities are safe for you.  Do any exercises or stretches as told by your health care provider.  If you do repetitive overhead tasks, take small breaks in between and include stretching exercises as told by your health care provider. General instructions  Do not use any products that contain nicotine or tobacco, such as cigarettes and e-cigarettes. These can delay healing. If you need help quitting, ask your health care provider.  Take over-the-counter and prescription medicines only as told by your health care provider.  Keep all follow-up visits as told by your health care provider. This is important. Contact a health care provider if:  Your pain gets worse.  You have new pain in your arm, hands, or fingers.  Your pain is not relieved with medicine or does not get better after 6 weeks of treatment.  You have cracking sensations when moving your shoulder in certain directions.  You hear a snapping sound after using your shoulder, followed by severe pain and weakness. Get help right away if:  Your arm, hand, or fingers are numb or tingling.  Your arm, hand, or fingers are swollen or painful or they turn white or blue. Summary  Rotator cuff tendinitis is inflammation of the tough, cord-like bands that connect muscle to bone (tendons) in the rotator cuff.  This condition is usually caused by overusing the rotator cuff, which includes all of the muscles and tendons that connect the arm to the shoulder.  This condition is more likely to  develop in athletes and workers who frequently use their shoulder or reach over their heads.  Treatment generally includes rest, anti-inflammatory medicines, and icing. In some cases, physical therapy and steroid injections may be needed. In severe cases, surgery may be needed. This information is not intended to replace advice given to you by your health care provider. Make sure you discuss any questions you have with your health care provider. Document Revised: 11/14/2018 Document Reviewed: 07/09/2016 Elsevier Patient Education  [employer].    Shoulder Exercises Ask your health care provider which exercises are safe for you. Do exercises exactly as told by your health care provider and adjust them as directed. It is normal to feel mild stretching, pulling, tightness, or discomfort as you do these exercises. Stop right away if you feel sudden pain or your pain gets worse. Do not begin these exercises until told by your health care provider. You can start these exercises as soon as you are able.  Recommend completing the stretch/exercise 8-10 times; at least 2-3 times per week.  Stretching exercises  External rotation  and abduction This exercise is sometimes called corner stretch. This exercise rotates your arm outward (external rotation) and moves your arm out from your body (abduction). 1. Stand in a doorway with one of your feet slightly in front of the other. This is called a staggered stance. If you cannot reach your forearms to the door frame, stand facing a corner of a room. 2. Choose one of the following positions as told by your health care provider: ? Place your hands and forearms on the door frame above your head. ? Place your hands and forearms on the door frame at the height of your head. ? Place your hands on the door frame at the height of your elbows. 3. Slowly move your weight onto your front foot until you feel a stretch across your chest and in the front of your  shoulders. Keep your head and chest upright and keep your abdominal muscles tight. 4. Hold for 8-10 seconds. 5. To release the stretch, shift your weight to your back foot. Repeat 8-10 times. Complete this exercise 2-3 times a day. Extension, standing 1. Stand and hold a broomstick, a cane, or a similar object behind your back. ? Your hands should be a little wider than shoulder width apart. ? Your palms should face away from your back. 2. Keeping your elbows straight and your shoulder muscles relaxed, move the stick away from your body until you feel a stretch in your shoulders (extension). ? Avoid shrugging your shoulders while you move the stick. Keep your shoulder blades tucked down toward the middle of your back. 3. Hold for __________ seconds. 4. Slowly return to the starting position. Repeat __________ times. Complete this exercise __________ times a day. Range-of-motion exercises Pendulum  1. Stand near a wall or a surface that you can hold onto for balance. 2. Bend at the waist and let your left / right arm hang straight down. Use your other arm to support you. Keep your back straight and do not lock your knees. 3. Relax your left / right arm and shoulder muscles, and move your hips and your trunk so your left / right arm swings freely. Your arm should swing because of the motion of your body, not because you are using your arm or shoulder muscles. 4. Keep moving your hips and trunk so your arm swings in the following directions, as told by your health care provider: ? Side to side. ? Forward and backward. ? In clockwise and counterclockwise circles. 5. Continue each motion for __________ seconds, or for as long as told by your health care provider. 6. Slowly return to the starting position. Repeat __________ times. Complete this exercise __________ times a day. Shoulder flexion, standing  1. Stand and hold a broomstick, a cane, or a similar object. Place your hands a little more  than shoulder width apart on the object. Your left / right hand should be palm up, and your other hand should be palm down. 2. Keep your elbow straight and your shoulder muscles relaxed. Push the stick up with your healthy arm to raise your left / right arm in front of your body, and then over your head until you feel a stretch in your shoulder (flexion). ? Avoid shrugging your shoulder while you raise your arm. Keep your shoulder blade tucked down toward the middle of your back. 3. Hold for __________ seconds. 4. Slowly return to the starting position. Repeat __________ times. Complete this exercise __________ times a day. Shoulder abduction, standing  1. Stand and hold a broomstick, a cane, or a similar object. Place your hands a little more than shoulder width apart on the object. Your left / right hand should be palm up, and your other hand should be palm down. 2. Keep your elbow straight and your shoulder muscles relaxed. Push the object across your body toward your left / right side. Raise your left / right arm to the side of your body (abduction) until you feel a stretch in your shoulder. ? Do not raise your arm above shoulder height unless your health care provider tells you to do that. ? If directed, raise your arm over your head. ? Avoid shrugging your shoulder while you raise your arm. Keep your shoulder blade tucked down toward the middle of your back. 3. Hold for __________ seconds. 4. Slowly return to the starting position. Repeat __________ times. Complete this exercise __________ times a day. Internal rotation  1. Place your left / right hand behind your back, palm up. 2. Use your other hand to dangle an exercise band, a towel, or a similar object over your shoulder. Grasp the band with your left / right hand so you are holding on to both ends. 3. Gently pull up on the band until you feel a stretch in the front of your left / right shoulder. The movement of your arm toward the  center of your body is called internal rotation. ? Avoid shrugging your shoulder while you raise your arm. Keep your shoulder blade tucked down toward the middle of your back. 4. Hold for __________ seconds. 5. Release the stretch by letting go of the band and lowering your hands. Repeat __________ times. Complete this exercise __________ times a day. Strengthening exercises External rotation  1. Sit in a stable chair without armrests. 2. Secure an exercise band to a stable object at elbow height on your left / right side. 3. Place a soft object, such as a folded towel or a small pillow, between your left / right upper arm and your body to move your elbow about 4 inches (10 cm) away from your side. 4. Hold the end of the exercise band so it is tight and there is no slack. 5. Keeping your elbow pressed against the soft object, slowly move your forearm out, away from your abdomen (external rotation). Keep your body steady so only your forearm moves. 6. Hold for __________ seconds. 7. Slowly return to the starting position. Repeat __________ times. Complete this exercise __________ times a day. Shoulder abduction  1. Sit in a stable chair without armrests, or stand up. 2. Hold a __________ weight in your left / right hand, or hold an exercise band with both hands. 3. Start with your arms straight down and your left / right palm facing in, toward your body. 4. Slowly lift your left / right hand out to your side (abduction). Do not lift your hand above shoulder height unless your health care provider tells you that this is safe. ? Keep your arms straight. ? Avoid shrugging your shoulder while you do this movement. Keep your shoulder blade tucked down toward the middle of your back. 5. Hold for __________ seconds. 6. Slowly lower your arm, and return to the starting position. Repeat __________ times. Complete this exercise __________ times a day. Shoulder extension 1. Sit in a stable chair  without armrests, or stand up. 2. Secure an exercise band to a stable object in front of you so it is at shoulder  height. 3. Hold one end of the exercise band in each hand. Your palms should face each other. 4. Straighten your elbows and lift your hands up to shoulder height. 5. Step back, away from the secured end of the exercise band, until the band is tight and there is no slack. 6. Squeeze your shoulder blades together as you pull your hands down to the sides of your thighs (extension). Stop when your hands are straight down by your sides. Do not let your hands go behind your body. 7. Hold for __________ seconds. 8. Slowly return to the starting position. Repeat __________ times. Complete this exercise __________ times a day. Shoulder row 1. Sit in a stable chair without armrests, or stand up. 2. Secure an exercise band to a stable object in front of you so it is at waist height. 3. Hold one end of the exercise band in each hand. Position your palms so that your thumbs are facing the ceiling (neutral position). 4. Bend each of your elbows to a 90-degree angle (right angle) and keep your upper arms at your sides. 5. Step back until the band is tight and there is no slack. 6. Slowly pull your elbows back behind you. 7. Hold for __________ seconds. 8. Slowly return to the starting position. Repeat __________ times. Complete this exercise __________ times a day. Shoulder press-ups  1. Sit in a stable chair that has armrests. Sit upright, with your feet flat on the floor. 2. Put your hands on the armrests so your elbows are bent and your fingers are pointing forward. Your hands should be about even with the sides of your body. 3. Push down on the armrests and use your arms to lift yourself off the chair. Straighten your elbows and lift yourself up as much as you comfortably can. ? Move your shoulder blades down, and avoid letting your shoulders move up toward your ears. ? Keep your feet on  the ground. As you get stronger, your feet should support less of your body weight as you lift yourself up. 4. Hold for __________ seconds. 5. Slowly lower yourself back into the chair. Repeat __________ times. Complete this exercise __________ times a day. Wall push-ups  1. Stand so you are facing a stable wall. Your feet should be about one arm-length away from the wall. 2. Lean forward and place your palms on the wall at shoulder height. 3. Keep your feet flat on the floor as you bend your elbows and lean forward toward the wall. 4. Hold for __________ seconds. 5. Straighten your elbows to push yourself back to the starting position. Repeat __________ times. Complete this exercise __________ times a day. This information is not intended to replace advice given to you by your health care provider. Make sure you discuss any questions you have with your health care provider. Document Revised: 11/14/2018 Document Reviewed: 08/22/2018 Elsevier Patient Education  [employer].

## 2020-06-21 DIAGNOSIS — I10 Essential (primary) hypertension: Secondary | ICD-10-CM | POA: Diagnosis not present

## 2020-06-21 DIAGNOSIS — E1165 Type 2 diabetes mellitus with hyperglycemia: Secondary | ICD-10-CM | POA: Diagnosis not present

## 2020-07-21 DIAGNOSIS — I10 Essential (primary) hypertension: Secondary | ICD-10-CM | POA: Diagnosis not present

## 2020-07-21 DIAGNOSIS — E1165 Type 2 diabetes mellitus with hyperglycemia: Secondary | ICD-10-CM | POA: Diagnosis not present

## 2020-08-10 DIAGNOSIS — F339 Major depressive disorder, recurrent, unspecified: Secondary | ICD-10-CM | POA: Diagnosis not present

## 2020-08-10 DIAGNOSIS — I10 Essential (primary) hypertension: Secondary | ICD-10-CM | POA: Diagnosis not present

## 2020-08-10 DIAGNOSIS — M1380 Other specified arthritis, unspecified site: Secondary | ICD-10-CM | POA: Diagnosis not present

## 2020-08-10 DIAGNOSIS — E1165 Type 2 diabetes mellitus with hyperglycemia: Secondary | ICD-10-CM | POA: Diagnosis not present

## 2020-08-10 DIAGNOSIS — F209 Schizophrenia, unspecified: Secondary | ICD-10-CM | POA: Diagnosis not present

## 2020-09-10 DIAGNOSIS — I10 Essential (primary) hypertension: Secondary | ICD-10-CM | POA: Diagnosis not present

## 2020-09-10 DIAGNOSIS — E1165 Type 2 diabetes mellitus with hyperglycemia: Secondary | ICD-10-CM | POA: Diagnosis not present

## 2020-10-08 DIAGNOSIS — I10 Essential (primary) hypertension: Secondary | ICD-10-CM | POA: Diagnosis not present

## 2020-10-08 DIAGNOSIS — E1165 Type 2 diabetes mellitus with hyperglycemia: Secondary | ICD-10-CM | POA: Diagnosis not present

## 2020-11-08 DIAGNOSIS — E1165 Type 2 diabetes mellitus with hyperglycemia: Secondary | ICD-10-CM | POA: Diagnosis not present

## 2020-11-08 DIAGNOSIS — I10 Essential (primary) hypertension: Secondary | ICD-10-CM | POA: Diagnosis not present

## 2020-12-08 DIAGNOSIS — E1165 Type 2 diabetes mellitus with hyperglycemia: Secondary | ICD-10-CM | POA: Diagnosis not present

## 2020-12-08 DIAGNOSIS — I10 Essential (primary) hypertension: Secondary | ICD-10-CM | POA: Diagnosis not present

## 2021-01-08 DIAGNOSIS — E1165 Type 2 diabetes mellitus with hyperglycemia: Secondary | ICD-10-CM | POA: Diagnosis not present

## 2021-01-08 DIAGNOSIS — I10 Essential (primary) hypertension: Secondary | ICD-10-CM | POA: Diagnosis not present

## 2021-01-12 DIAGNOSIS — E119 Type 2 diabetes mellitus without complications: Secondary | ICD-10-CM | POA: Diagnosis not present

## 2021-02-24 ENCOUNTER — Other Ambulatory Visit (HOSPITAL_COMMUNITY): Payer: Self-pay | Admitting: Internal Medicine

## 2021-02-24 DIAGNOSIS — Z23 Encounter for immunization: Secondary | ICD-10-CM | POA: Diagnosis not present

## 2021-02-24 DIAGNOSIS — Z1389 Encounter for screening for other disorder: Secondary | ICD-10-CM | POA: Diagnosis not present

## 2021-02-24 DIAGNOSIS — Z1231 Encounter for screening mammogram for malignant neoplasm of breast: Secondary | ICD-10-CM

## 2021-02-24 DIAGNOSIS — Z0001 Encounter for general adult medical examination with abnormal findings: Secondary | ICD-10-CM | POA: Diagnosis not present

## 2021-02-24 DIAGNOSIS — E785 Hyperlipidemia, unspecified: Secondary | ICD-10-CM | POA: Diagnosis not present

## 2021-02-24 DIAGNOSIS — E1165 Type 2 diabetes mellitus with hyperglycemia: Secondary | ICD-10-CM | POA: Diagnosis not present

## 2021-02-24 DIAGNOSIS — I10 Essential (primary) hypertension: Secondary | ICD-10-CM | POA: Diagnosis not present

## 2021-02-24 DIAGNOSIS — Z1331 Encounter for screening for depression: Secondary | ICD-10-CM | POA: Diagnosis not present

## 2021-03-27 DIAGNOSIS — I10 Essential (primary) hypertension: Secondary | ICD-10-CM | POA: Diagnosis not present

## 2021-03-27 DIAGNOSIS — E1165 Type 2 diabetes mellitus with hyperglycemia: Secondary | ICD-10-CM | POA: Diagnosis not present

## 2021-04-24 ENCOUNTER — Ambulatory Visit (HOSPITAL_COMMUNITY): Payer: Medicare HMO

## 2021-04-27 DIAGNOSIS — E1165 Type 2 diabetes mellitus with hyperglycemia: Secondary | ICD-10-CM | POA: Diagnosis not present

## 2021-04-27 DIAGNOSIS — I10 Essential (primary) hypertension: Secondary | ICD-10-CM | POA: Diagnosis not present

## 2021-05-09 ENCOUNTER — Encounter (HOSPITAL_COMMUNITY): Payer: Self-pay | Admitting: Emergency Medicine

## 2021-05-09 ENCOUNTER — Emergency Department (HOSPITAL_COMMUNITY): Payer: Medicare HMO

## 2021-05-09 ENCOUNTER — Other Ambulatory Visit: Payer: Self-pay

## 2021-05-09 ENCOUNTER — Emergency Department (HOSPITAL_COMMUNITY)
Admission: EM | Admit: 2021-05-09 | Discharge: 2021-05-09 | Disposition: A | Discharge status: Home / Self Care | Payer: Medicare HMO | Attending: Emergency Medicine | Admitting: Emergency Medicine

## 2021-05-09 DIAGNOSIS — E109 Type 1 diabetes mellitus without complications: Secondary | ICD-10-CM | POA: Diagnosis not present

## 2021-05-09 DIAGNOSIS — R531 Weakness: Secondary | ICD-10-CM | POA: Diagnosis not present

## 2021-05-09 DIAGNOSIS — Z85038 Personal history of other malignant neoplasm of large intestine: Secondary | ICD-10-CM | POA: Diagnosis not present

## 2021-05-09 DIAGNOSIS — M79671 Pain in right foot: Secondary | ICD-10-CM | POA: Insufficient documentation

## 2021-05-09 DIAGNOSIS — Z7982 Long term (current) use of aspirin: Secondary | ICD-10-CM | POA: Insufficient documentation

## 2021-05-09 DIAGNOSIS — I10 Essential (primary) hypertension: Secondary | ICD-10-CM | POA: Diagnosis not present

## 2021-05-09 DIAGNOSIS — Z794 Long term (current) use of insulin: Secondary | ICD-10-CM | POA: Insufficient documentation

## 2021-05-09 DIAGNOSIS — F17210 Nicotine dependence, cigarettes, uncomplicated: Secondary | ICD-10-CM | POA: Insufficient documentation

## 2021-05-09 DIAGNOSIS — Z79899 Other long term (current) drug therapy: Secondary | ICD-10-CM | POA: Diagnosis not present

## 2021-05-09 MED ORDER — HYDROCODONE-ACETAMINOPHEN 5-325 MG PO TABS: 1.0000 | ORAL_TABLET | Freq: Four times a day (QID) | ORAL | 0 refills | Status: AC | PRN

## 2021-05-09 MED ORDER — NAPROXEN 500 MG PO TABS
500.0000 mg | ORAL_TABLET | Freq: Two times a day (BID) | ORAL | 0 refills | Status: AC
Start: 2021-05-09 — End: ?

## 2021-05-09 NOTE — ED Provider Notes (Signed)
[facility] EMERGENCY DEPARTMENT Provider Note   CSN: [id number] Arrival date & time: 05/09/21  0800     History Chief Complaint  Patient presents with   Foot Pain    Allison Cooke is a 60 y.o. female.  Patient complains of right foot pain.  She has a history of diabetes..  Patient complains of pain to the plantar forefoot.  The history is provided by the patient and medical records. No language interpreter was used.  Foot Pain This is a new problem. The current episode started 12 to 24 hours ago. The problem occurs constantly. The problem has not changed since onset.Pertinent negatives include no chest pain, no abdominal pain and no headaches. Nothing aggravates the symptoms. Nothing relieves the symptoms. She has tried nothing for the symptoms.      Past Medical History:  Diagnosis Date   Anemia    Arthritis    Chronic post-traumatic stress disorder (PTSD) 01/29/2016   Depression    Diabetes mellitus without complication ([place])    Diabetes, type 1.5, uncontrolled, managed as type 1 06/28/2015   Headache    Heart murmur    High cholesterol    Hypertension    Lumbar back pain 02/03/2016   Seizures ([place])    as a child   SOB (shortness of breath)     Patient Active Problem List   Diagnosis Date Noted   Chest pain 12/18/2017   Neck pain, musculoskeletal 09/24/2017   Chronic pain of both shoulders 09/10/2017   Subacromial bursitis of left shoulder joint 09/10/2017   Subacromial bursitis of right shoulder joint 09/10/2017   Elevated rheumatoid factor 09/10/2017   Rheumatoid factor positive 08/19/2017   Vitamin D deficiency 10/04/2016   Carpal tunnel syndrome, bilateral 06/25/2016   Chronic pain of right ankle 06/25/2016   Hyperlipidemia LDL goal <100 06/25/2016   Fatigue 05/08/2016   Atopic dermatitis 05/08/2016   Special screening for malignant neoplasms, colon    Benign neoplasm of sigmoid colon    Benign neoplasm of descending colon    Rectal polyp    Lumbar  back pain 02/03/2016   Chronic post-traumatic stress disorder (PTSD) 01/29/2016   Right anterior knee pain 08/03/2015   Diabetes mellitus type 1.5, managed as type 1 ([place]) 06/28/2015   Abnormal EKG 04/22/2015   Dysphagia 03/30/2015   Medication monitoring encounter 03/30/2015   Essential hypertension, benign 03/02/2015   Depression, major, recurrent ([place]) 03/02/2015   Tobacco abuse 03/02/2015   Chronic dermatitis 03/02/2015   Hoarseness of voice 03/02/2015    Past Surgical History:  Procedure Laterality Date   BREAST EXCISIONAL BIOPSY Left 2016   NEG   BREAST LUMPECTOMY Left 07/14/2015   Procedure: BREAST LUMPECTOMY;  Surgeon: Christene Lye, MD;  Location: [facility] ORS;  Service: General;  Laterality: Left;   BREAST SURGERY Left    CESAREAN SECTION     CHOLECYSTECTOMY     COLONOSCOPY WITH PROPOFOL N/A 03/06/2016   Procedure: COLONOSCOPY WITH PROPOFOL;  Surgeon: [name], MD;  Location: [facility] ENDOSCOPY;  Service: Endoscopy;  Laterality: N/A;   DIRECT LARYNGOSCOPY N/A 04/26/2015   Procedure: Microlaryngoscopy with exxcision of vocal cord polyp ;  Surgeon: Beverly Gust, MD;  Location: [facility] ORS;  Service: ENT;  Laterality: N/A;   FINE NEEDLE ASPIRATION Left    breast cyst, benign, age 50?     OB History   No obstetric history on file.     Family History  Problem Relation Age of Onset   Hypertension Mother  Heart disease Mother    Hyperlipidemia Mother    Diabetes Mother    Heart disease Father    Hyperlipidemia Father    Hypertension Father    Diabetes Sister    Hypertension Daughter    Diabetes Maternal Grandmother    Diabetes Sister    Heart failure Sister    Cancer Neg Hx    Stroke Neg Hx    COPD Neg Hx    Breast cancer Neg Hx     Social History   Tobacco Use   Smoking status: Some Days    Packs/day: 0.25    Years: 40.00    Pack years: 10.00    Types: Cigarettes   Smokeless tobacco: Never  Vaping Use   Vaping Use: Never used  Substance Use  Topics   Alcohol use: No   Drug use: No    Home Medications Prior to Admission medications   Medication Sig Start Date End Date Taking? Authorizing Provider  HYDROcodone-acetaminophen (NORCO/VICODIN) 5-325 MG tablet Take 1 tablet by mouth every 6 (six) hours as needed. 05/09/21  Yes Milton Ferguson, MD  naproxen (NAPROSYN) 500 MG tablet Take 1 tablet (500 mg total) by mouth 2 (two) times daily. 05/09/21  Yes Milton Ferguson, MD  amLODipine (NORVASC) 5 MG tablet Take 1 tablet (5 mg total) by mouth every morning. 10/01/17   Arnetha Courser, MD  ARIPiprazole (ABILIFY) 15 MG tablet Take 15 mg by mouth daily.    [provider]  aspirin EC 81 MG tablet Take 1 tablet (81 mg total) by mouth daily. 10/01/17   Arnetha Courser, MD  atorvastatin (LIPITOR) 80 MG tablet Take 1 tablet (80 mg total) by mouth daily. For cholesterol 10/01/17   Lada, Satira Anis, MD  carbamide peroxide (DEBROX) 6.5 % OTIC solution Place 5 drops into the left ear 2 (two) times daily. 01/08/18   Poulose, Bethel Born, NP  Crisaborole (EUCRISA) 2 % OINT Apply 1 each topically 2 (two) times daily. Patient taking differently: Apply 1 each topically as needed.  05/08/16   Arnetha Courser, MD  cyclobenzaprine (FLEXERIL) 10 MG tablet Take 1 tablet (10 mg total) by mouth 3 (three) times daily as needed. 08/14/17   Sable Feil, PA-C  doxycycline (VIBRA-TABS) 100 MG tablet Take 1 tablet (100 mg total) by mouth 2 (two) times daily. 01/08/18   Poulose, Bethel Born, NP  Elastic Bandages & Supports (FUTURO LEFT HAND WRIST BRACE) MISC Wear as often as possible for one month for carpal tunnel syndrome 10/04/16   Lada, Satira Anis, MD  escitalopram (LEXAPRO) 10 MG tablet Take 10 mg by mouth daily.    [provider]  glucose blood (ACCU-CHEK AVIVA) test strip Check fingerstick blood sugars three times a day; E11.65, LON 99 months; insulin-dependent; Accu-check Aviva Plus strips 02/10/16   Gladstone Lighter, MD  haloperidol (HALDOL) 5 MG tablet  Take 5 mg by mouth at bedtime.    [provider]  insulin aspart (NOVOLOG) 100 UNIT/ML FlexPen Inject 20 Units into the skin 3 (three) times daily before meals. 10/01/17   Arnetha Courser, MD  Insulin Detemir (LEVEMIR) 100 UNIT/ML Pen Inject 55 Units into the skin Nightly. 08/16/17   Arnetha Courser, MD  Lancets (ONETOUCH ULTRASOFT) lancets Use as instructed 02/10/16   Gladstone Lighter, MD  lisinopril (PRINIVIL,ZESTRIL) 5 MG tablet Take 1 tablet (5 mg total) by mouth daily. 10/01/17   Arnetha Courser, MD  loratadine (CLARITIN) 10 MG tablet Take 1 tablet (  10 mg total) by mouth daily. 01/08/18   Poulose, Bethel Born, NP  montelukast (SINGULAIR) 10 MG tablet Take 1 tablet (10 mg total) by mouth at bedtime. For allergies and eczema 04/26/17   Lada, Satira Anis, MD  PROVENTIL HFA 108 (90 Base) MCG/ACT inhaler INHALE 2 PUFFS INTO THE LUNGS EVERY 6 HOURS AS NEEDED FOR WHEEZING ORSHORTNESS OF BREATH. 03/26/18   Lada, Satira Anis, MD  QUEtiapine (SEROQUEL) 25 MG tablet Take 25 mg by mouth daily.    [provider]  traZODone (DESYREL) 150 MG tablet Take by mouth at bedtime. 04/26/17   Arnetha Courser, MD    Allergies    Shrimp [shellfish allergy]  Review of Systems   Review of Systems  Constitutional:  Negative for appetite change and fatigue.  HENT:  Negative for congestion, ear discharge and sinus pressure.   Eyes:  Negative for discharge.  Respiratory:  Negative for cough.   Cardiovascular:  Negative for chest pain.  Gastrointestinal:  Negative for abdominal pain and diarrhea.  Genitourinary:  Negative for frequency and hematuria.  Musculoskeletal:  Negative for back pain.       Right foot pain.  Skin:  Negative for rash.  Neurological:  Negative for seizures and headaches.  Psychiatric/Behavioral:  Negative for hallucinations.    Physical Exam Updated Vital Signs BP (!) 153/96   Pulse 98   Temp 98 F (36.7 C)   Resp 18   Ht 5\' 2"  (1.575 m)   Wt 72.6 kg   LMP 04/29/2015  (Approximate)   SpO2 100%   BMI 29.26 kg/m   Physical Exam Vitals and nursing note reviewed.  Constitutional:      Appearance: She is well-developed.  HENT:     Head: Normocephalic.     Mouth/Throat:     Mouth: Mucous membranes are moist.  Eyes:     Conjunctiva/sclera: Conjunctivae normal.  Neck:     Trachea: No tracheal deviation.  Cardiovascular:     Rate and Rhythm: Normal rate.  Pulmonary:     Effort: Pulmonary effort is normal.  Musculoskeletal:     Comments: Tenderness to the plantar forefoot of the right foot.  Skin:    General: Skin is warm.  Neurological:     Mental Status: She is alert and oriented to person, place, and time.    ED Results / Procedures / Treatments   Labs (all labs ordered are listed, but only abnormal results are displayed) Labs Reviewed - No data to display  EKG None  Radiology DG Foot Complete Right  Result Date: 05/09/2021 CLINICAL DATA:  Pain across the top of the right foot since yesterday. No injury. EXAM: RIGHT FOOT COMPLETE - 3 VIEW COMPARISON:  None. FINDINGS: No fracture or bone lesion. Joints are normally spaced and aligned. No significant degenerative/arthropathic change. Soft tissues are unremarkable. IMPRESSION: Negative. Electronically Signed   By: Lajean Manes M.D.   On: 05/09/2021 08:38    Procedures Procedures   Medications Ordered in ED Medications - No data to display  ED Course  I have reviewed the triage vital signs and the nursing notes.  Pertinent labs & imaging results that were available during my care of the patient were reviewed by me and considered in my medical decision making (see chart for details).    MDM Rules/Calculators/A&P                           Patient with painful mid  part of her foot.  She will be started on Naprosyn and Vicodin and follow-up with orthopedic x-rays unremarkable Final Clinical Impression(s) / ED Diagnoses Final diagnoses:  Foot pain, right    Rx / DC Orders ED  Discharge Orders          Ordered    naproxen (NAPROSYN) 500 MG tablet  2 times daily        05/09/21 1012    HYDROcodone-acetaminophen (NORCO/VICODIN) 5-325 MG tablet  Every 6 hours PRN        05/09/21 1012             Milton Ferguson, MD 05/13/21 1154

## 2021-05-09 NOTE — Discharge Instructions (Addendum)
Follow-up with your family doctor or Dr. Aline Brochure next week.  Use a walker to ambulate with

## 2021-05-09 NOTE — ED Notes (Signed)
Unable to provide walker for floor stock, none available for dispense, Dr Roderic Palau gave written prescription to obtain from pharmacy

## 2021-05-09 NOTE — ED Triage Notes (Signed)
Pt c/o right foot pain since waking yesterday am. Denies injury.

## 2021-05-27 DIAGNOSIS — E1165 Type 2 diabetes mellitus with hyperglycemia: Secondary | ICD-10-CM | POA: Diagnosis not present

## 2021-05-27 DIAGNOSIS — I10 Essential (primary) hypertension: Secondary | ICD-10-CM | POA: Diagnosis not present

## 2021-06-02 ENCOUNTER — Ambulatory Visit (HOSPITAL_COMMUNITY): Payer: Medicare HMO

## 2021-06-06 DIAGNOSIS — E1165 Type 2 diabetes mellitus with hyperglycemia: Secondary | ICD-10-CM | POA: Diagnosis not present

## 2021-06-06 DIAGNOSIS — E1136 Type 2 diabetes mellitus with diabetic cataract: Secondary | ICD-10-CM | POA: Diagnosis not present

## 2021-06-06 DIAGNOSIS — F339 Major depressive disorder, recurrent, unspecified: Secondary | ICD-10-CM | POA: Diagnosis not present

## 2021-06-06 DIAGNOSIS — I10 Essential (primary) hypertension: Secondary | ICD-10-CM | POA: Diagnosis not present

## 2021-06-06 DIAGNOSIS — Z23 Encounter for immunization: Secondary | ICD-10-CM | POA: Diagnosis not present

## 2021-06-06 DIAGNOSIS — G4700 Insomnia, unspecified: Secondary | ICD-10-CM | POA: Diagnosis not present

## 2021-07-06 DIAGNOSIS — E1165 Type 2 diabetes mellitus with hyperglycemia: Secondary | ICD-10-CM | POA: Diagnosis not present

## 2021-07-06 DIAGNOSIS — I10 Essential (primary) hypertension: Secondary | ICD-10-CM | POA: Diagnosis not present

## 2021-08-06 DIAGNOSIS — E1165 Type 2 diabetes mellitus with hyperglycemia: Secondary | ICD-10-CM | POA: Diagnosis not present

## 2021-08-06 DIAGNOSIS — I10 Essential (primary) hypertension: Secondary | ICD-10-CM | POA: Diagnosis not present

## 2021-09-06 DIAGNOSIS — I10 Essential (primary) hypertension: Secondary | ICD-10-CM | POA: Diagnosis not present

## 2021-09-06 DIAGNOSIS — E1165 Type 2 diabetes mellitus with hyperglycemia: Secondary | ICD-10-CM | POA: Diagnosis not present

## 2021-09-20 IMAGING — US US BREAST*R* LIMITED INC AXILLA
1 series · 5 of 5 positions shown · non-contrast
Comparison: Previous exam(s).

CLINICAL DATA: Delayed follow-up for likely benign right breast
asymmetry.

EXAM:
DIGITAL DIAGNOSTIC BILATERAL MAMMOGRAM WITH TOMO AND CAD; ULTRASOUND
RIGHT BREAST LIMITED

[Series 1: us breast*right* limited inc axilla · 0.07mm/px · 5 of 5 slices shown]
[im 1/5]
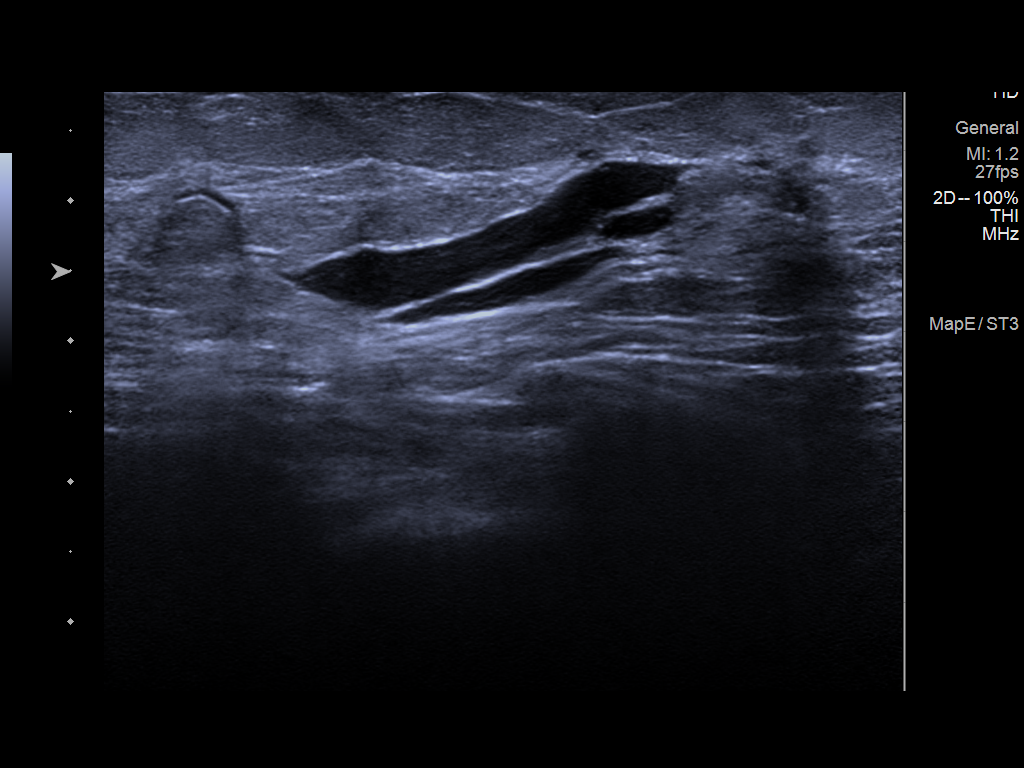
[im 2/5]
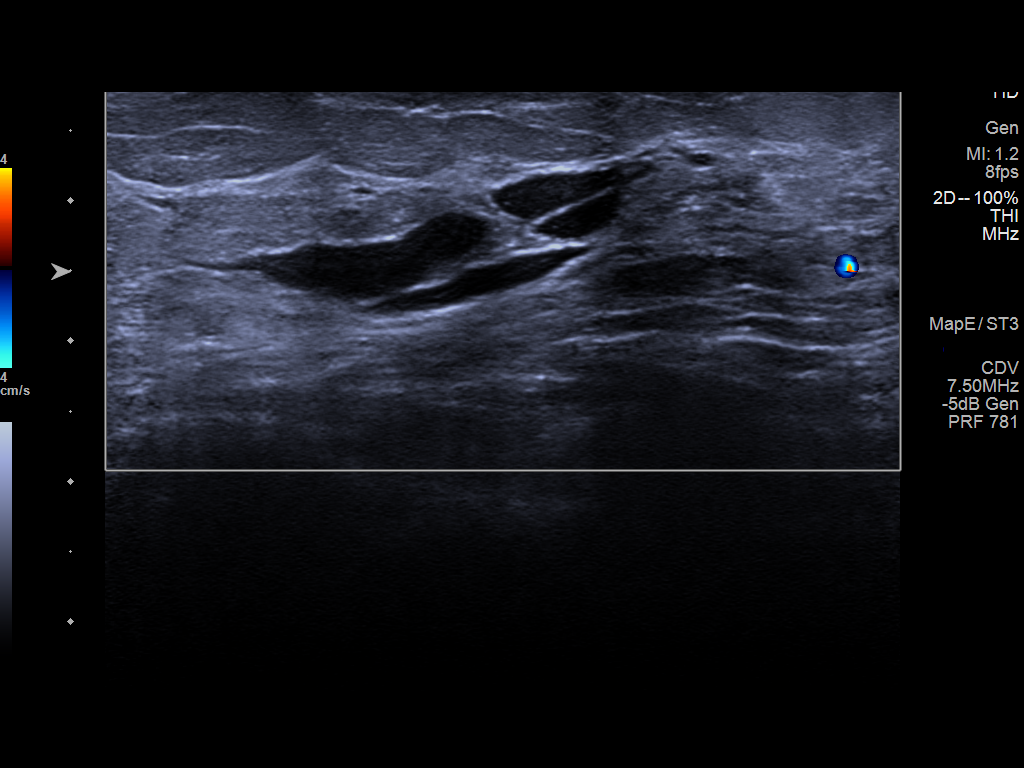
[im 3/5]
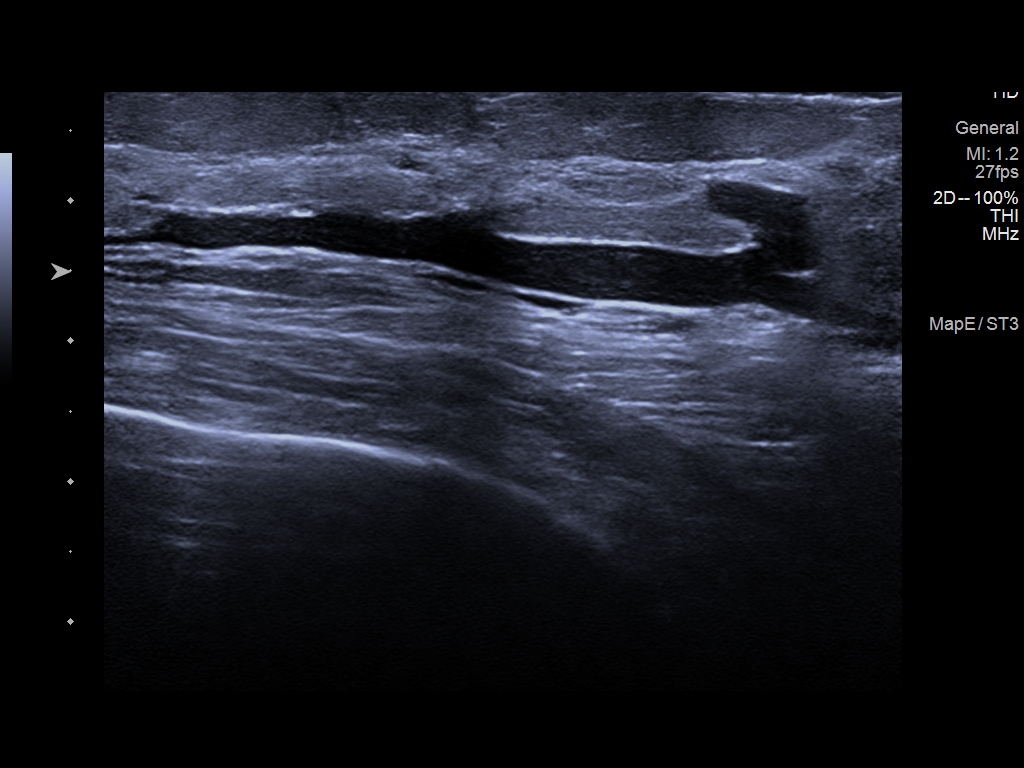
[im 4/5]
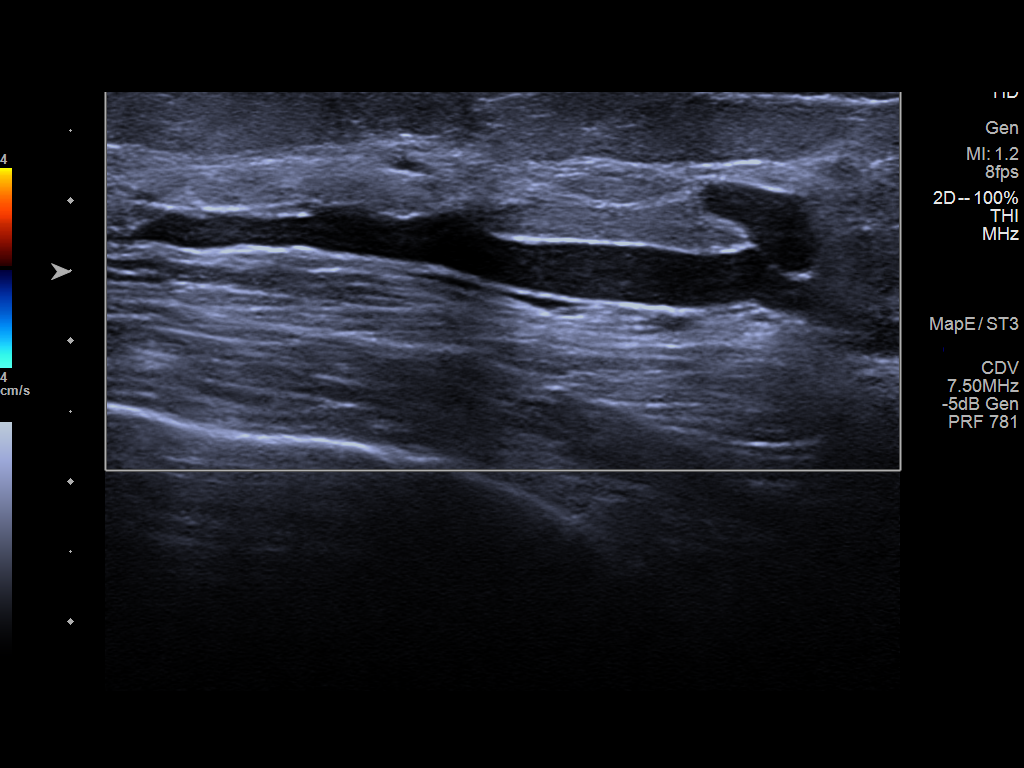
[im 5/5]
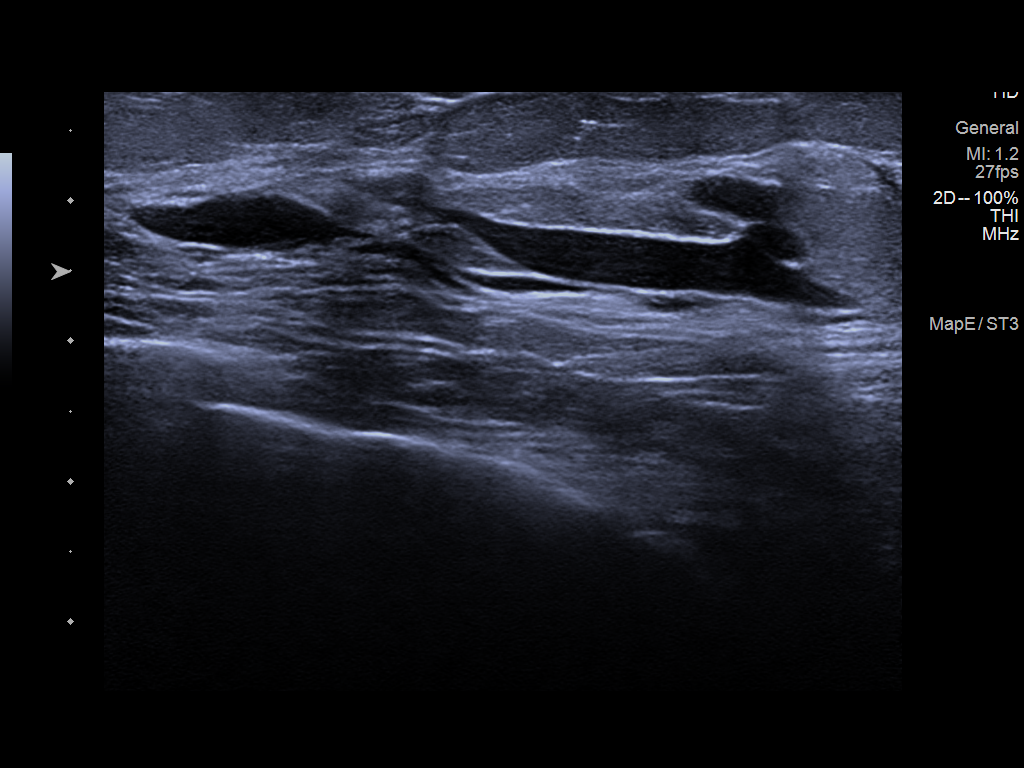

[5 of 5 positions shown; findings below may reference images not displayed]

ACR Breast Density Category c: The breast tissue is heterogeneously
dense, which may obscure small masses.
FINDINGS: The asymmetry in the lateral aspect of the right breast is again
identified, and is similar in size though appears more rounded than
on the prior exams. No other suspicious calcifications, masses or
areas of distortion are seen in the bilateral breasts.

Mammographic images were processed with CAD.

Ultrasound targeted to the lateral aspect of the right breast again
demonstrates multiple dilated ducts, some filled with mobile debris.
No intraductal masses are identified. No parenchymal masses or
suspicious areas of shadowing.
IMPRESSION: 1.  Benign dilated ducts in the lateral right breast.

2.  No evidence of malignancy in the bilateral breasts.

RECOMMENDATION:
Screening mammogram in one year.(Code:CJ-[id number])

I have discussed the findings and recommendations with the patient.
If applicable, a reminder letter will be sent to the patient
regarding the next appointment.

BI-RADS CATEGORY  2: Benign.

## 2021-10-04 DIAGNOSIS — E1165 Type 2 diabetes mellitus with hyperglycemia: Secondary | ICD-10-CM | POA: Diagnosis not present

## 2021-10-04 DIAGNOSIS — I10 Essential (primary) hypertension: Secondary | ICD-10-CM | POA: Diagnosis not present

## 2021-10-26 IMAGING — DX DG SHOULDER 2+V*R*
3 series · 3 of 3 positions shown · non-contrast
Comparison: None.

CLINICAL DATA: Shoulder pain over the last few days.

EXAM:
RIGHT SHOULDER - 2+ VIEW

[shoulder grashey]
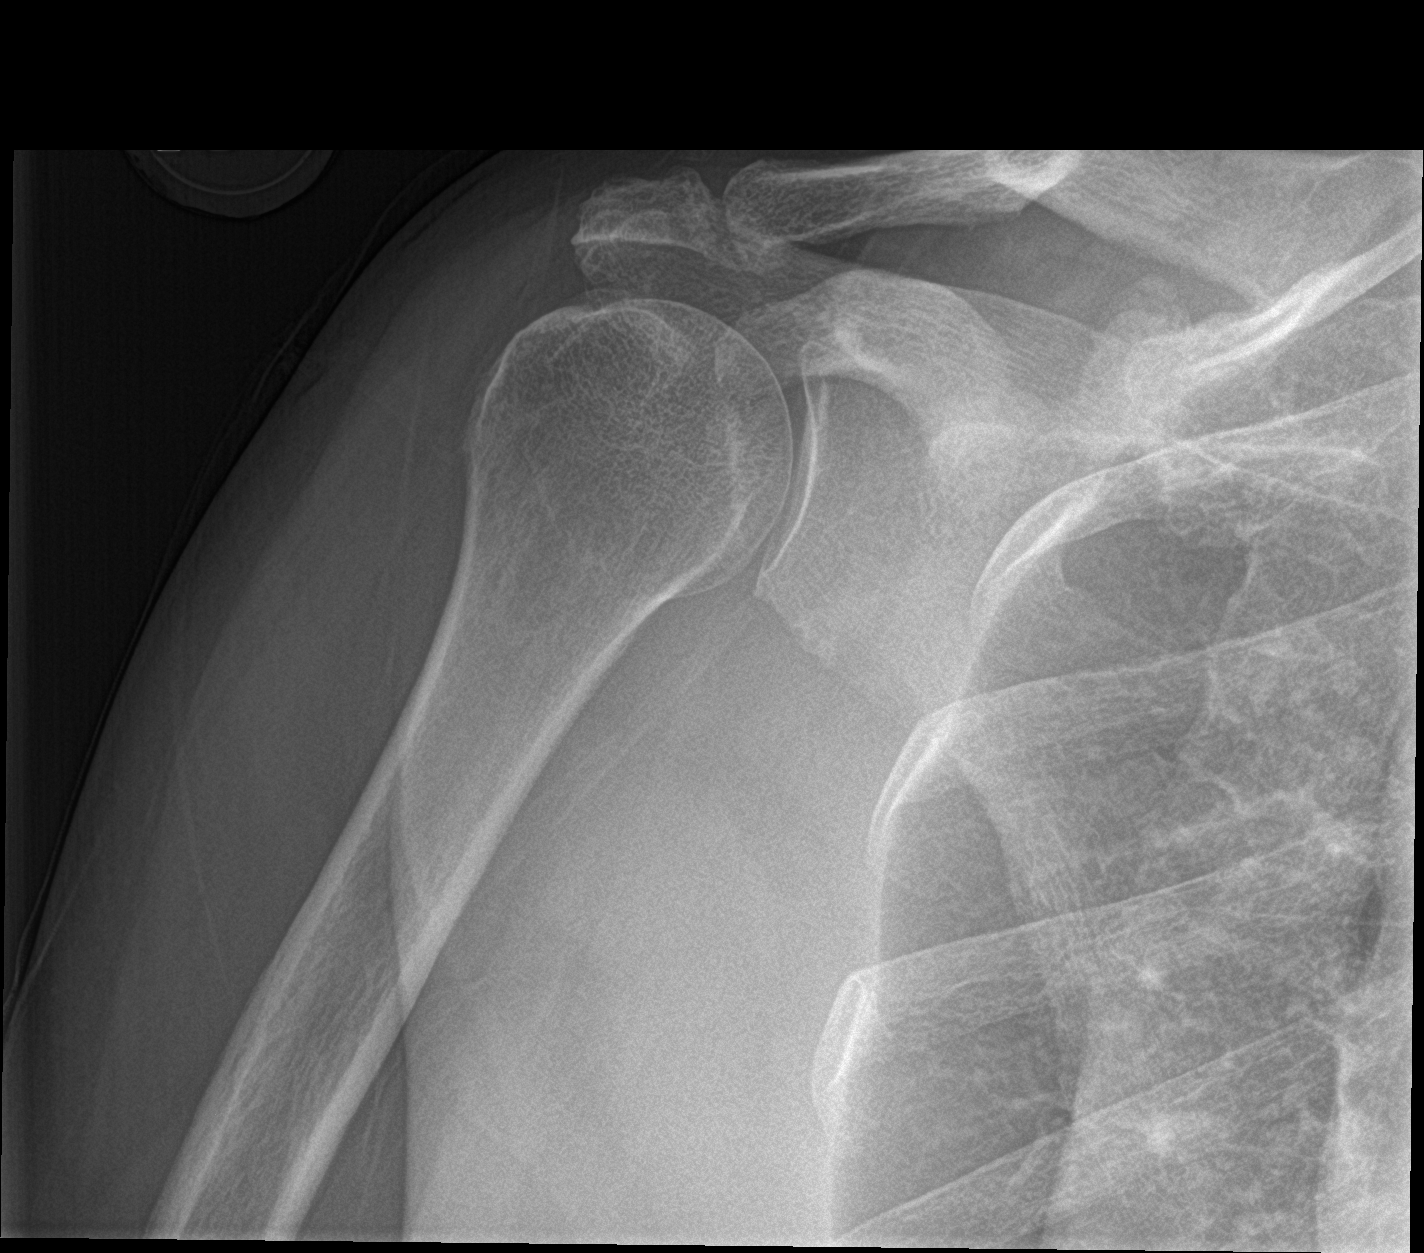

[shoulder y view]
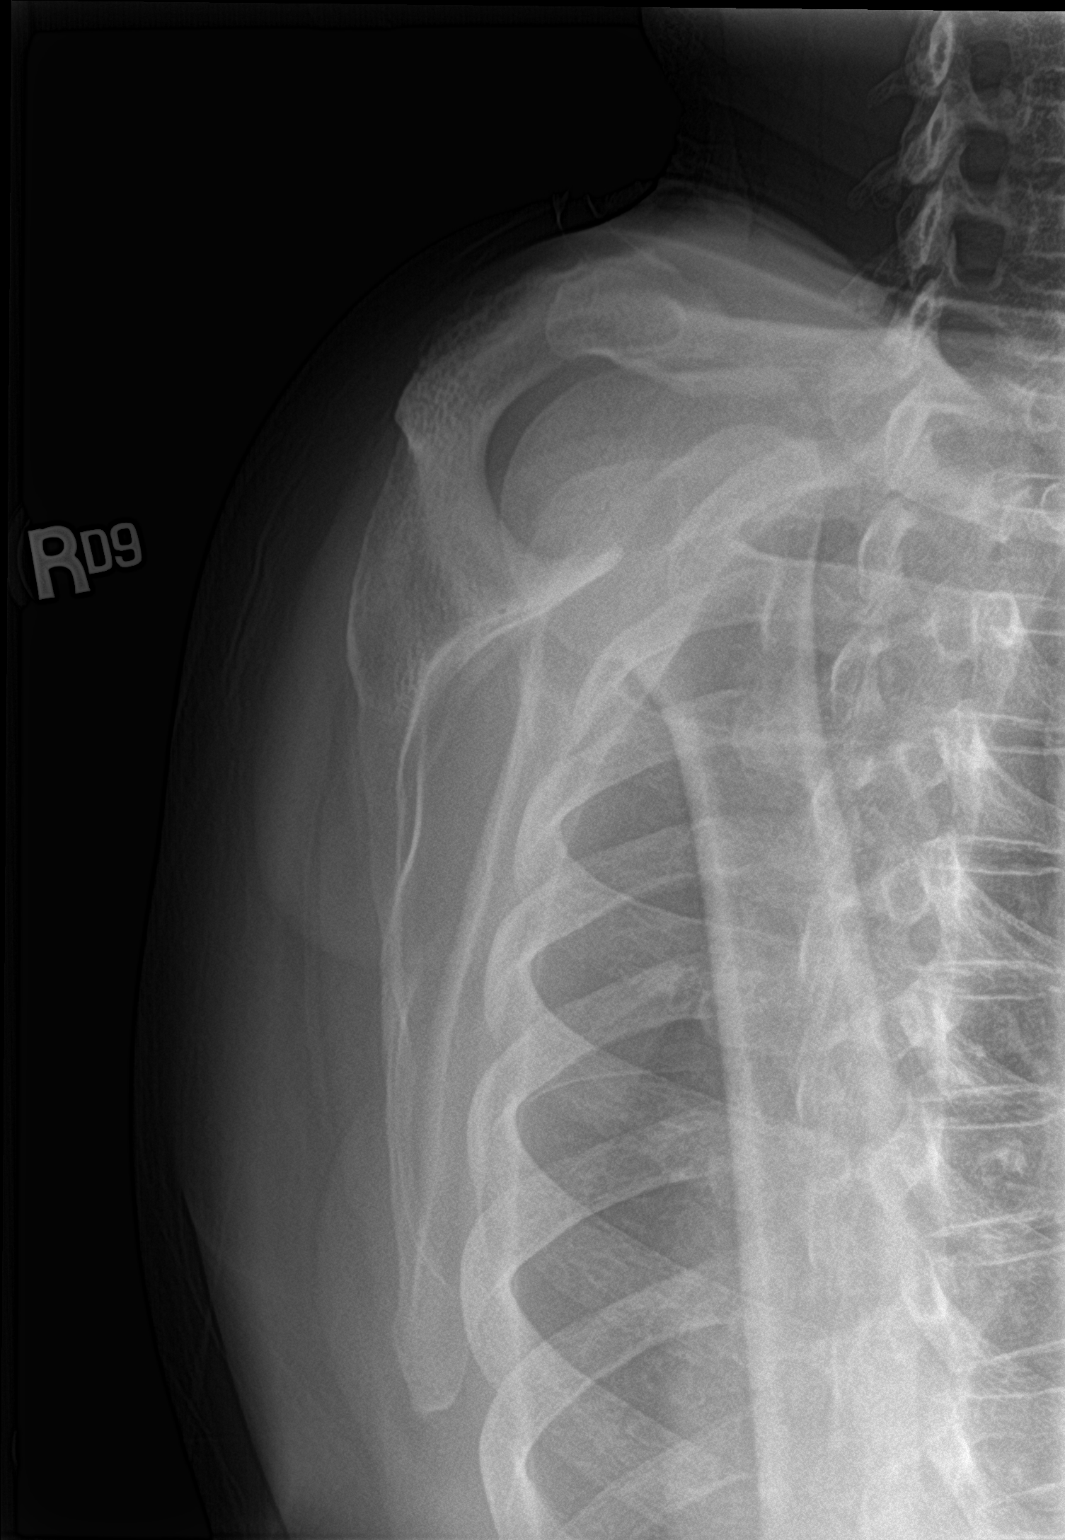

[shoulder axillary]
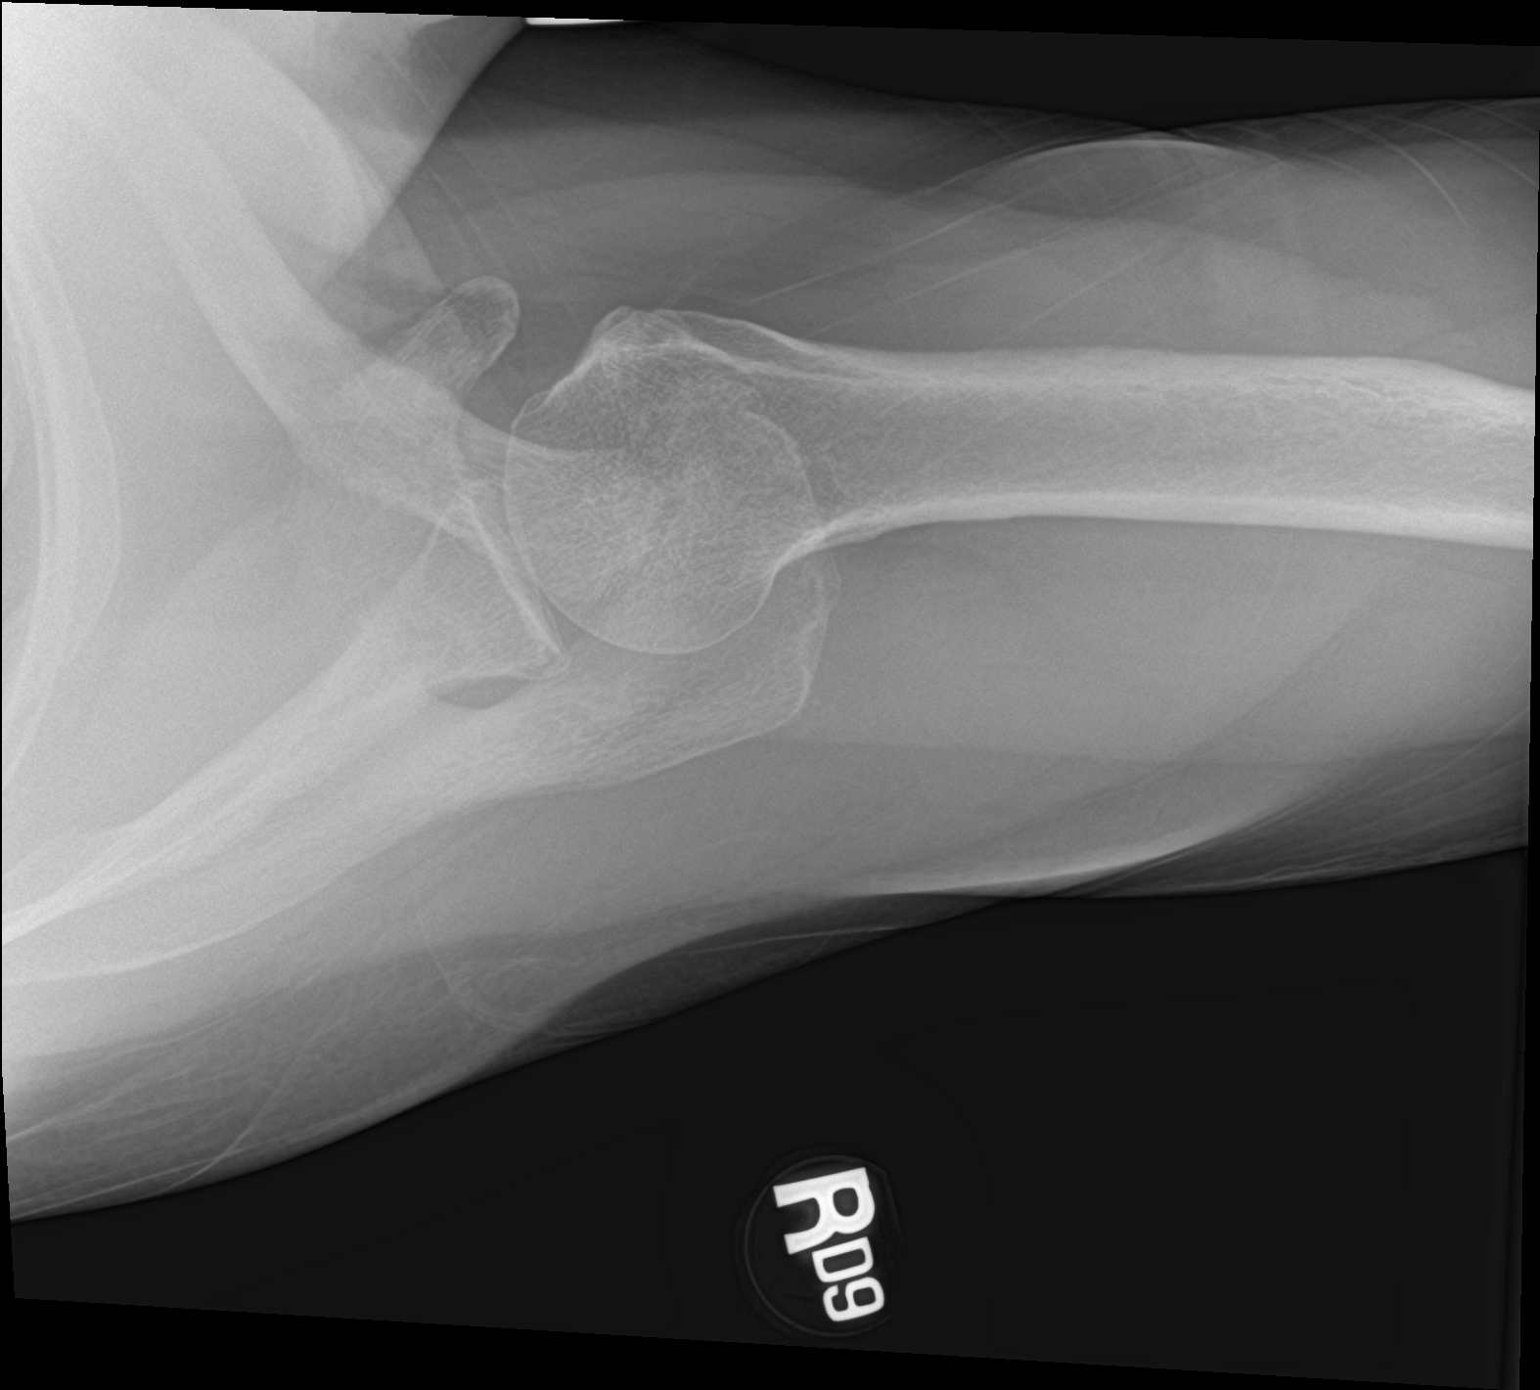

[3 of 3 positions shown; findings below may reference images not displayed]

FINDINGS: Glenohumeral joint appears normal. Mild narrowing of the humeral
acromial distance. Inferiorly projecting osteophytes from the AC
joint that could cause rotator cuff impingement.
IMPRESSION: No acute finding. Inferiorly projecting osteophytes from the AC
joint that could cause rotator cuff impingement.

## 2021-11-04 DIAGNOSIS — I10 Essential (primary) hypertension: Secondary | ICD-10-CM | POA: Diagnosis not present

## 2021-11-04 DIAGNOSIS — E1165 Type 2 diabetes mellitus with hyperglycemia: Secondary | ICD-10-CM | POA: Diagnosis not present

## 2021-12-13 DIAGNOSIS — E785 Hyperlipidemia, unspecified: Secondary | ICD-10-CM | POA: Diagnosis not present

## 2021-12-13 DIAGNOSIS — F209 Schizophrenia, unspecified: Secondary | ICD-10-CM | POA: Diagnosis not present

## 2021-12-13 DIAGNOSIS — I10 Essential (primary) hypertension: Secondary | ICD-10-CM | POA: Diagnosis not present

## 2021-12-13 DIAGNOSIS — F339 Major depressive disorder, recurrent, unspecified: Secondary | ICD-10-CM | POA: Diagnosis not present

## 2021-12-13 DIAGNOSIS — E1165 Type 2 diabetes mellitus with hyperglycemia: Secondary | ICD-10-CM | POA: Diagnosis not present

## 2022-01-22 DIAGNOSIS — F339 Major depressive disorder, recurrent, unspecified: Secondary | ICD-10-CM | POA: Diagnosis not present

## 2022-01-22 DIAGNOSIS — E1165 Type 2 diabetes mellitus with hyperglycemia: Secondary | ICD-10-CM | POA: Diagnosis not present

## 2022-01-22 DIAGNOSIS — I10 Essential (primary) hypertension: Secondary | ICD-10-CM | POA: Diagnosis not present

## 2022-01-22 DIAGNOSIS — K219 Gastro-esophageal reflux disease without esophagitis: Secondary | ICD-10-CM | POA: Diagnosis not present

## 2022-02-21 DIAGNOSIS — I10 Essential (primary) hypertension: Secondary | ICD-10-CM | POA: Diagnosis not present

## 2022-02-21 DIAGNOSIS — E1165 Type 2 diabetes mellitus with hyperglycemia: Secondary | ICD-10-CM | POA: Diagnosis not present

## 2022-03-24 DIAGNOSIS — E1165 Type 2 diabetes mellitus with hyperglycemia: Secondary | ICD-10-CM | POA: Diagnosis not present

## 2022-03-24 DIAGNOSIS — I10 Essential (primary) hypertension: Secondary | ICD-10-CM | POA: Diagnosis not present

## 2022-04-24 DIAGNOSIS — E1165 Type 2 diabetes mellitus with hyperglycemia: Secondary | ICD-10-CM | POA: Diagnosis not present

## 2022-04-24 DIAGNOSIS — I10 Essential (primary) hypertension: Secondary | ICD-10-CM | POA: Diagnosis not present

## 2022-05-24 DIAGNOSIS — E1165 Type 2 diabetes mellitus with hyperglycemia: Secondary | ICD-10-CM | POA: Diagnosis not present

## 2022-05-24 DIAGNOSIS — E785 Hyperlipidemia, unspecified: Secondary | ICD-10-CM | POA: Diagnosis not present

## 2022-05-24 DIAGNOSIS — Z6829 Body mass index (BMI) 29.0-29.9, adult: Secondary | ICD-10-CM | POA: Diagnosis not present

## 2022-05-31 DIAGNOSIS — E119 Type 2 diabetes mellitus without complications: Secondary | ICD-10-CM | POA: Diagnosis not present

## 2022-07-12 DIAGNOSIS — Z1389 Encounter for screening for other disorder: Secondary | ICD-10-CM | POA: Diagnosis not present

## 2022-07-12 DIAGNOSIS — F339 Major depressive disorder, recurrent, unspecified: Secondary | ICD-10-CM | POA: Diagnosis not present

## 2022-07-12 DIAGNOSIS — R69 Illness, unspecified: Secondary | ICD-10-CM | POA: Diagnosis not present

## 2022-07-12 DIAGNOSIS — E785 Hyperlipidemia, unspecified: Secondary | ICD-10-CM | POA: Diagnosis not present

## 2022-07-12 DIAGNOSIS — Z1331 Encounter for screening for depression: Secondary | ICD-10-CM | POA: Diagnosis not present

## 2022-07-12 DIAGNOSIS — I10 Essential (primary) hypertension: Secondary | ICD-10-CM | POA: Diagnosis not present

## 2022-07-12 DIAGNOSIS — E1165 Type 2 diabetes mellitus with hyperglycemia: Secondary | ICD-10-CM | POA: Diagnosis not present

## 2022-07-12 DIAGNOSIS — Z0001 Encounter for general adult medical examination with abnormal findings: Secondary | ICD-10-CM | POA: Diagnosis not present

## 2022-07-16 DIAGNOSIS — E785 Hyperlipidemia, unspecified: Secondary | ICD-10-CM | POA: Diagnosis not present

## 2022-07-16 DIAGNOSIS — I10 Essential (primary) hypertension: Secondary | ICD-10-CM | POA: Diagnosis not present

## 2022-07-16 DIAGNOSIS — R69 Illness, unspecified: Secondary | ICD-10-CM | POA: Diagnosis not present

## 2022-07-16 DIAGNOSIS — E1165 Type 2 diabetes mellitus with hyperglycemia: Secondary | ICD-10-CM | POA: Diagnosis not present

## 2022-08-16 DIAGNOSIS — I10 Essential (primary) hypertension: Secondary | ICD-10-CM | POA: Diagnosis not present

## 2022-08-16 DIAGNOSIS — E1165 Type 2 diabetes mellitus with hyperglycemia: Secondary | ICD-10-CM | POA: Diagnosis not present

## 2022-09-16 DIAGNOSIS — I10 Essential (primary) hypertension: Secondary | ICD-10-CM | POA: Diagnosis not present

## 2022-09-16 DIAGNOSIS — E1165 Type 2 diabetes mellitus with hyperglycemia: Secondary | ICD-10-CM | POA: Diagnosis not present

## 2022-10-04 ENCOUNTER — Encounter: Payer: Self-pay | Admitting: Radiology

## 2022-10-15 DIAGNOSIS — E1165 Type 2 diabetes mellitus with hyperglycemia: Secondary | ICD-10-CM | POA: Diagnosis not present

## 2022-10-15 DIAGNOSIS — I10 Essential (primary) hypertension: Secondary | ICD-10-CM | POA: Diagnosis not present

## 2022-11-10 ENCOUNTER — Emergency Department
Admission: EM | Admit: 2022-11-10 | Discharge: 2022-11-10 | Disposition: A | Discharge status: Home / Self Care | Payer: Medicare HMO | Attending: Emergency Medicine | Admitting: Emergency Medicine

## 2022-11-10 ENCOUNTER — Other Ambulatory Visit: Payer: Self-pay

## 2022-11-10 DIAGNOSIS — E1065 Type 1 diabetes mellitus with hyperglycemia: Secondary | ICD-10-CM | POA: Insufficient documentation

## 2022-11-10 DIAGNOSIS — R21 Rash and other nonspecific skin eruption: Secondary | ICD-10-CM | POA: Diagnosis not present

## 2022-11-10 DIAGNOSIS — Z794 Long term (current) use of insulin: Secondary | ICD-10-CM | POA: Insufficient documentation

## 2022-11-10 DIAGNOSIS — I10 Essential (primary) hypertension: Secondary | ICD-10-CM | POA: Diagnosis not present

## 2022-11-10 DIAGNOSIS — T7849XA Other allergy, initial encounter: Secondary | ICD-10-CM

## 2022-11-10 DIAGNOSIS — L234 Allergic contact dermatitis due to dyes: Secondary | ICD-10-CM | POA: Diagnosis not present

## 2022-11-10 DIAGNOSIS — T7840XA Allergy, unspecified, initial encounter: Secondary | ICD-10-CM | POA: Diagnosis not present

## 2022-11-10 LAB — CBG MONITORING, ED: Glucose-Capillary: 217 mg/dL — ABNORMAL HIGH (ref 70–99)

## 2022-11-10 MED ORDER — PREDNISONE 10 MG PO TABS: ORAL_TABLET | ORAL | 0 refills | Status: AC

## 2022-11-10 MED ORDER — DEXAMETHASONE SODIUM PHOSPHATE 10 MG/ML IJ SOLN
10.0000 mg | Freq: Once | INTRAMUSCULAR | Status: AC
  Administered 2022-11-10: 10 mg via INTRAMUSCULAR
  Filled 2022-11-10: qty 1

## 2022-11-10 MED ORDER — DIPHENHYDRAMINE HCL 25 MG PO CAPS: 25.0000 mg | ORAL_CAPSULE | Freq: Four times a day (QID) | ORAL | 0 refills | Status: AC | PRN

## 2022-11-10 NOTE — ED Notes (Signed)
States had a allergic reaction to hair dye. She had her hair done on Wed. She woke up this morning face was swollen and has rash on back of neck. States it is itching. Did take some Benadryl. Right eye is swollen.

## 2022-11-10 NOTE — Discharge Instructions (Signed)
Follow-up with your primary care provider if any continued problems or concerns.  Prescription was sent to the pharmacy both for Benadryl and prednisone to take as directed.  It is very important that you take your regular medication with this to control your blood pressure and also your diabetes.  You will see that the prednisone can elevate your blood sugars temporarily while you are taking the medication.  Be very mindful of the foods that you are eating while taking this medication and monitor your blood sugars.

## 2022-11-10 NOTE — ED Provider Notes (Signed)
[facility] Provider Note    Event Date/Time   First MD Initiated Contact with Patient 11/10/22 [phone]     (approximate)   History   Allergic Reaction   HPI  Allison Cooke is a 62 y.o. female presents to the ED with complaint of scalp itching and rash.  Patient states she had her hair dyed on Wednesday and Wednesday evening she began having some itching which is now developed into a rash.  She states that this happened 1 other time before and she came to the ED for an injection which made a big improvement.  She denies any difficulty breathing.  Patient has history of diabetes type 1 with use of insulin, hypertension, PTSD, rheumatoid arthritis and depression.  Patient states that she has not taken her insulin or her blood pressure medication today.     Physical Exam   Triage Vital Signs: ED Triage Vitals [11/10/22 0921]  Enc Vitals Group     BP (!) 149/114     Pulse Rate 92     Resp 18     Temp 97.9 F (36.6 C)     Temp src      SpO2 96 %     Weight 165 lb (74.8 kg)     Height 5\' 2"  (1.575 m)     Head Circumference      Peak Flow      Pain Score 0     Pain Loc      Pain Edu?      Excl. in GC?     Most recent vital signs: Vitals:   11/10/22 0921 11/10/22 1018  BP: (!) 149/114 134/73  Pulse: 92 72  Resp: 18 16  Temp: 97.9 F (36.6 C) 98.1 F (36.7 C)  SpO2: 96% 98%     General: Awake, no distress.  CV:  Good peripheral perfusion.  Resp:  Normal effort.  Abd:  No distention.  Other:  On examination of the scalp there are multiple small papules noted along the hairline and across scalp.  No evidence of infection.  Consistent with a contact dermatitis.   ED Results / Procedures / Treatments   Labs (all labs ordered are listed, but only abnormal results are displayed) Labs Reviewed  CBG MONITORING, ED - Abnormal; Notable for the following components:      Result Value   Glucose-Capillary 217 (*)    All other components within  normal limits     PROCEDURES:  Critical Care performed:   Procedures   MEDICATIONS ORDERED IN ED: Medications  dexamethasone (DECADRON) injection 10 mg (10 mg Intramuscular Given 11/10/22 1002)     IMPRESSION / MDM / ASSESSMENT AND PLAN / ED COURSE  I reviewed the triage vital signs and the nursing notes.   Differential diagnosis includes, but is not limited to, contact dermatitis, allergic dermatitis, eczema, cellulitis.  Diabetes and hypertension poorly controlled.  62 year old female presents to the ED with complaint of itching to her scalp within 12 hours of her dying her hair.  Patient has had this to happen to her before and has been taking Benadryl and small doses without any relief.  Patient presents to the ED not having taken her insulin or her blood pressure medication.  I strongly encouraged her to keep up with her diabetes and also made her aware that the Decadron and prednisone that she would be taking would increase her blood sugars and is part for her to stay on her  diabetic diet and also watch her blood sugars.  Patient agrees to comply.  A prescription for Benadryl and prednisone was sent to the pharmacy and patient was given an injection of Decadron 10 mg IM.      Patient's presentation is most consistent with acute complicated illness / injury requiring diagnostic workup.  FINAL CLINICAL IMPRESSION(S) / ED DIAGNOSES   Final diagnoses:  Allergic reaction to hair dye     Rx / DC Orders   ED Discharge Orders          Ordered    predniSONE (DELTASONE) 10 MG tablet        11/10/22 1023    diphenhydrAMINE (BENADRYL ALLERGY) 25 mg capsule  Every 6 hours PRN        11/10/22 1023             Note:  This document was prepared using Dragon voice recognition software and may include unintentional dictation errors.   Tommi Rumps, PA-C 11/10/22 1236    Shaune Pollack, MD 11/10/22 [phone]

## 2022-11-10 NOTE — ED Triage Notes (Signed)
Pt to ED for rash around back of neck and right eye since Wednesday after coloring hair. Swelling noted around right eye. Endorses itching. No swelling noted to tongue or lips.

## 2022-11-15 DIAGNOSIS — I10 Essential (primary) hypertension: Secondary | ICD-10-CM | POA: Diagnosis not present

## 2022-11-15 DIAGNOSIS — E1165 Type 2 diabetes mellitus with hyperglycemia: Secondary | ICD-10-CM | POA: Diagnosis not present

## 2022-12-17 DIAGNOSIS — E1165 Type 2 diabetes mellitus with hyperglycemia: Secondary | ICD-10-CM | POA: Diagnosis not present

## 2022-12-17 DIAGNOSIS — I10 Essential (primary) hypertension: Secondary | ICD-10-CM | POA: Diagnosis not present

## 2023-01-17 DIAGNOSIS — I10 Essential (primary) hypertension: Secondary | ICD-10-CM | POA: Diagnosis not present

## 2023-01-17 DIAGNOSIS — E1165 Type 2 diabetes mellitus with hyperglycemia: Secondary | ICD-10-CM | POA: Diagnosis not present

## 2023-02-11 DIAGNOSIS — Z0001 Encounter for general adult medical examination with abnormal findings: Secondary | ICD-10-CM | POA: Diagnosis not present

## 2023-02-11 DIAGNOSIS — E785 Hyperlipidemia, unspecified: Secondary | ICD-10-CM | POA: Diagnosis not present

## 2023-02-11 DIAGNOSIS — I10 Essential (primary) hypertension: Secondary | ICD-10-CM | POA: Diagnosis not present

## 2023-02-11 DIAGNOSIS — E1165 Type 2 diabetes mellitus with hyperglycemia: Secondary | ICD-10-CM | POA: Diagnosis not present

## 2023-03-14 DIAGNOSIS — I10 Essential (primary) hypertension: Secondary | ICD-10-CM | POA: Diagnosis not present

## 2023-03-14 DIAGNOSIS — E1165 Type 2 diabetes mellitus with hyperglycemia: Secondary | ICD-10-CM | POA: Diagnosis not present

## 2023-04-14 DIAGNOSIS — I10 Essential (primary) hypertension: Secondary | ICD-10-CM | POA: Diagnosis not present

## 2023-04-14 DIAGNOSIS — E1165 Type 2 diabetes mellitus with hyperglycemia: Secondary | ICD-10-CM | POA: Diagnosis not present

## 2023-05-14 DIAGNOSIS — I10 Essential (primary) hypertension: Secondary | ICD-10-CM | POA: Diagnosis not present

## 2023-05-14 DIAGNOSIS — E1165 Type 2 diabetes mellitus with hyperglycemia: Secondary | ICD-10-CM | POA: Diagnosis not present

## 2023-06-14 DIAGNOSIS — I10 Essential (primary) hypertension: Secondary | ICD-10-CM | POA: Diagnosis not present

## 2023-06-14 DIAGNOSIS — E1165 Type 2 diabetes mellitus with hyperglycemia: Secondary | ICD-10-CM | POA: Diagnosis not present

## 2023-08-05 DIAGNOSIS — I10 Essential (primary) hypertension: Secondary | ICD-10-CM | POA: Diagnosis not present

## 2023-08-05 DIAGNOSIS — E1165 Type 2 diabetes mellitus with hyperglycemia: Secondary | ICD-10-CM | POA: Diagnosis not present

## 2023-08-05 DIAGNOSIS — E785 Hyperlipidemia, unspecified: Secondary | ICD-10-CM | POA: Diagnosis not present

## 2023-08-05 DIAGNOSIS — Z1389 Encounter for screening for other disorder: Secondary | ICD-10-CM | POA: Diagnosis not present

## 2023-08-05 DIAGNOSIS — Z0001 Encounter for general adult medical examination with abnormal findings: Secondary | ICD-10-CM | POA: Diagnosis not present

## 2023-08-05 DIAGNOSIS — Z23 Encounter for immunization: Secondary | ICD-10-CM | POA: Diagnosis not present

## 2023-08-05 DIAGNOSIS — F17210 Nicotine dependence, cigarettes, uncomplicated: Secondary | ICD-10-CM | POA: Diagnosis not present

## 2023-08-05 DIAGNOSIS — F17200 Nicotine dependence, unspecified, uncomplicated: Secondary | ICD-10-CM | POA: Diagnosis not present

## 2023-09-05 DIAGNOSIS — I10 Essential (primary) hypertension: Secondary | ICD-10-CM | POA: Diagnosis not present

## 2023-09-05 DIAGNOSIS — E1165 Type 2 diabetes mellitus with hyperglycemia: Secondary | ICD-10-CM | POA: Diagnosis not present

## 2023-10-04 DIAGNOSIS — E1165 Type 2 diabetes mellitus with hyperglycemia: Secondary | ICD-10-CM | POA: Diagnosis not present

## 2023-10-04 DIAGNOSIS — I10 Essential (primary) hypertension: Secondary | ICD-10-CM | POA: Diagnosis not present

## 2023-11-01 DIAGNOSIS — I10 Essential (primary) hypertension: Secondary | ICD-10-CM | POA: Diagnosis not present

## 2023-11-01 DIAGNOSIS — E1165 Type 2 diabetes mellitus with hyperglycemia: Secondary | ICD-10-CM | POA: Diagnosis not present

## 2023-12-02 DIAGNOSIS — E1165 Type 2 diabetes mellitus with hyperglycemia: Secondary | ICD-10-CM | POA: Diagnosis not present

## 2023-12-02 DIAGNOSIS — I10 Essential (primary) hypertension: Secondary | ICD-10-CM | POA: Diagnosis not present

## 2024-01-01 DIAGNOSIS — E1165 Type 2 diabetes mellitus with hyperglycemia: Secondary | ICD-10-CM | POA: Diagnosis not present

## 2024-01-01 DIAGNOSIS — I10 Essential (primary) hypertension: Secondary | ICD-10-CM | POA: Diagnosis not present

## 2024-02-11 DIAGNOSIS — I10 Essential (primary) hypertension: Secondary | ICD-10-CM | POA: Diagnosis not present

## 2024-02-11 DIAGNOSIS — E1165 Type 2 diabetes mellitus with hyperglycemia: Secondary | ICD-10-CM | POA: Diagnosis not present

## 2024-03-19 DIAGNOSIS — F17210 Nicotine dependence, cigarettes, uncomplicated: Secondary | ICD-10-CM | POA: Diagnosis not present

## 2024-03-19 DIAGNOSIS — Z0001 Encounter for general adult medical examination with abnormal findings: Secondary | ICD-10-CM | POA: Diagnosis not present

## 2024-03-19 DIAGNOSIS — E785 Hyperlipidemia, unspecified: Secondary | ICD-10-CM | POA: Diagnosis not present

## 2024-03-19 DIAGNOSIS — F17200 Nicotine dependence, unspecified, uncomplicated: Secondary | ICD-10-CM | POA: Diagnosis not present

## 2024-03-19 DIAGNOSIS — I10 Essential (primary) hypertension: Secondary | ICD-10-CM | POA: Diagnosis not present

## 2024-03-19 DIAGNOSIS — Z1389 Encounter for screening for other disorder: Secondary | ICD-10-CM | POA: Diagnosis not present

## 2024-03-19 DIAGNOSIS — E1165 Type 2 diabetes mellitus with hyperglycemia: Secondary | ICD-10-CM | POA: Diagnosis not present

## 2024-03-20 DIAGNOSIS — E1165 Type 2 diabetes mellitus with hyperglycemia: Secondary | ICD-10-CM | POA: Diagnosis not present

## 2024-03-20 DIAGNOSIS — E785 Hyperlipidemia, unspecified: Secondary | ICD-10-CM | POA: Diagnosis not present

## 2024-03-20 DIAGNOSIS — I10 Essential (primary) hypertension: Secondary | ICD-10-CM | POA: Diagnosis not present

## 2024-04-19 DIAGNOSIS — E1165 Type 2 diabetes mellitus with hyperglycemia: Secondary | ICD-10-CM | POA: Diagnosis not present

## 2024-04-19 DIAGNOSIS — I10 Essential (primary) hypertension: Secondary | ICD-10-CM | POA: Diagnosis not present

## 2024-05-19 DIAGNOSIS — E1165 Type 2 diabetes mellitus with hyperglycemia: Secondary | ICD-10-CM | POA: Diagnosis not present

## 2024-05-19 DIAGNOSIS — I10 Essential (primary) hypertension: Secondary | ICD-10-CM | POA: Diagnosis not present

## 2024-09-08 ENCOUNTER — Other Ambulatory Visit (HOSPITAL_COMMUNITY): Payer: Self-pay | Admitting: Internal Medicine

## 2024-09-08 DIAGNOSIS — Z1231 Encounter for screening mammogram for malignant neoplasm of breast: Secondary | ICD-10-CM

## 2024-09-11 ENCOUNTER — Ambulatory Visit (HOSPITAL_COMMUNITY)
